# Patient Record
Sex: Female | Born: 1950
Health system: Southern US, Community
[De-identification: ages and names within clinical notes are randomized; demographics above are authoritative.]

## PROBLEM LIST (undated history)

## (undated) DIAGNOSIS — D649 Anemia, unspecified: Secondary | ICD-10-CM

## (undated) DIAGNOSIS — G4730 Sleep apnea, unspecified: Secondary | ICD-10-CM

## (undated) DIAGNOSIS — R7303 Prediabetes: Secondary | ICD-10-CM

## (undated) DIAGNOSIS — M1990 Unspecified osteoarthritis, unspecified site: Secondary | ICD-10-CM

## (undated) DIAGNOSIS — I10 Essential (primary) hypertension: Secondary | ICD-10-CM

## (undated) DIAGNOSIS — E785 Hyperlipidemia, unspecified: Secondary | ICD-10-CM

## (undated) DIAGNOSIS — R4189 Other symptoms and signs involving cognitive functions and awareness: Secondary | ICD-10-CM

## (undated) DIAGNOSIS — R413 Other amnesia: Secondary | ICD-10-CM

## (undated) HISTORY — DX: Other amnesia: R41.3

## (undated) HISTORY — DX: Hyperlipidemia, unspecified: E78.5

## (undated) HISTORY — DX: Essential (primary) hypertension: I10

## (undated) HISTORY — PX: TUBAL LIGATION: SHX77

## (undated) HISTORY — DX: Sleep apnea, unspecified: G47.30

## (undated) HISTORY — DX: Prediabetes: R73.03

## (undated) HISTORY — DX: Other symptoms and signs involving cognitive functions and awareness: R41.89

## (undated) HISTORY — PX: JOINT REPLACEMENT: SHX530

## (undated) HISTORY — DX: Anemia, unspecified: D64.9

## (undated) HISTORY — DX: Unspecified osteoarthritis, unspecified site: M19.90

## (undated) HISTORY — PX: GANGLION CYST EXCISION: SHX1691

---

## 1999-01-01 ENCOUNTER — Ambulatory Visit (HOSPITAL_COMMUNITY): Admission: RE | Admit: 1999-01-01 | Discharge: 1999-01-01 | Payer: Self-pay | Admitting: Gastroenterology

## 2000-02-03 ENCOUNTER — Encounter: Admission: RE | Admit: 2000-02-03 | Discharge: 2000-02-03 | Payer: Self-pay | Admitting: Internal Medicine

## 2000-02-03 ENCOUNTER — Encounter: Payer: Self-pay | Admitting: Internal Medicine

## 2001-01-26 ENCOUNTER — Other Ambulatory Visit: Admission: RE | Admit: 2001-01-26 | Discharge: 2001-01-26 | Payer: Self-pay | Admitting: Internal Medicine

## 2001-02-02 ENCOUNTER — Encounter: Admission: RE | Admit: 2001-02-02 | Discharge: 2001-02-18 | Payer: Self-pay | Admitting: Internal Medicine

## 2001-02-04 ENCOUNTER — Encounter: Payer: Self-pay | Admitting: Internal Medicine

## 2001-02-04 ENCOUNTER — Encounter: Admission: RE | Admit: 2001-02-04 | Discharge: 2001-02-04 | Payer: Self-pay | Admitting: Internal Medicine

## 2001-02-08 ENCOUNTER — Encounter: Admission: RE | Admit: 2001-02-08 | Discharge: 2001-02-08 | Payer: Self-pay | Admitting: Internal Medicine

## 2001-02-08 ENCOUNTER — Encounter: Payer: Self-pay | Admitting: Internal Medicine

## 2002-03-31 ENCOUNTER — Other Ambulatory Visit: Admission: RE | Admit: 2002-03-31 | Discharge: 2002-03-31 | Payer: Self-pay | Admitting: Internal Medicine

## 2002-05-06 ENCOUNTER — Encounter: Payer: Self-pay | Admitting: Internal Medicine

## 2002-05-06 ENCOUNTER — Encounter: Admission: RE | Admit: 2002-05-06 | Discharge: 2002-05-06 | Payer: Self-pay | Admitting: Internal Medicine

## 2002-11-10 ENCOUNTER — Encounter: Admission: RE | Admit: 2002-11-10 | Discharge: 2002-11-10 | Payer: Self-pay | Admitting: Internal Medicine

## 2002-11-10 ENCOUNTER — Encounter: Payer: Self-pay | Admitting: Internal Medicine

## 2003-04-03 ENCOUNTER — Other Ambulatory Visit: Admission: RE | Admit: 2003-04-03 | Discharge: 2003-04-03 | Payer: Self-pay | Admitting: Internal Medicine

## 2003-06-12 ENCOUNTER — Encounter: Admission: RE | Admit: 2003-06-12 | Discharge: 2003-06-12 | Payer: Self-pay | Admitting: Internal Medicine

## 2004-01-17 ENCOUNTER — Encounter: Admission: RE | Admit: 2004-01-17 | Discharge: 2004-04-16 | Payer: Self-pay | Admitting: Internal Medicine

## 2004-07-22 ENCOUNTER — Other Ambulatory Visit: Admission: RE | Admit: 2004-07-22 | Discharge: 2004-07-22 | Payer: Self-pay | Admitting: Internal Medicine

## 2004-08-16 ENCOUNTER — Encounter: Admission: RE | Admit: 2004-08-16 | Discharge: 2004-08-16 | Payer: Self-pay | Admitting: Internal Medicine

## 2005-08-11 ENCOUNTER — Encounter: Admission: RE | Admit: 2005-08-11 | Discharge: 2005-08-11 | Payer: Self-pay | Admitting: Internal Medicine

## 2005-09-18 ENCOUNTER — Other Ambulatory Visit: Admission: RE | Admit: 2005-09-18 | Discharge: 2005-09-18 | Payer: Self-pay | Admitting: Internal Medicine

## 2005-12-30 ENCOUNTER — Encounter: Admission: RE | Admit: 2005-12-30 | Discharge: 2005-12-30 | Payer: Self-pay | Admitting: Internal Medicine

## 2006-11-03 ENCOUNTER — Other Ambulatory Visit: Admission: RE | Admit: 2006-11-03 | Discharge: 2006-11-03 | Payer: Self-pay | Admitting: Internal Medicine

## 2006-11-30 ENCOUNTER — Ambulatory Visit: Payer: Self-pay | Admitting: Pulmonary Disease

## 2007-01-06 ENCOUNTER — Encounter: Admission: RE | Admit: 2007-01-06 | Discharge: 2007-01-06 | Payer: Self-pay | Admitting: Internal Medicine

## 2007-01-08 ENCOUNTER — Ambulatory Visit (HOSPITAL_BASED_OUTPATIENT_CLINIC_OR_DEPARTMENT_OTHER): Admission: RE | Admit: 2007-01-08 | Discharge: 2007-01-08 | Payer: Self-pay | Admitting: Pulmonary Disease

## 2007-01-19 ENCOUNTER — Ambulatory Visit: Payer: Self-pay | Admitting: Pulmonary Disease

## 2007-02-01 ENCOUNTER — Ambulatory Visit: Payer: Self-pay | Admitting: Pulmonary Disease

## 2007-11-18 ENCOUNTER — Encounter: Admission: RE | Admit: 2007-11-18 | Discharge: 2008-01-04 | Payer: Self-pay | Admitting: Orthopaedic Surgery

## 2008-01-27 ENCOUNTER — Encounter: Admission: RE | Admit: 2008-01-27 | Discharge: 2008-01-27 | Payer: Self-pay | Admitting: Internal Medicine

## 2008-05-05 ENCOUNTER — Other Ambulatory Visit: Admission: RE | Admit: 2008-05-05 | Discharge: 2008-05-05 | Payer: Self-pay | Admitting: Internal Medicine

## 2008-05-05 ENCOUNTER — Ambulatory Visit: Payer: Self-pay | Admitting: Internal Medicine

## 2008-05-23 ENCOUNTER — Inpatient Hospital Stay (HOSPITAL_COMMUNITY): Admission: RE | Admit: 2008-05-23 | Discharge: 2008-05-25 | Payer: Self-pay | Admitting: Orthopaedic Surgery

## 2008-05-23 ENCOUNTER — Encounter (INDEPENDENT_AMBULATORY_CARE_PROVIDER_SITE_OTHER): Payer: Self-pay | Admitting: Orthopaedic Surgery

## 2008-06-20 ENCOUNTER — Encounter: Admission: RE | Admit: 2008-06-20 | Discharge: 2008-08-02 | Payer: Self-pay | Admitting: Orthopaedic Surgery

## 2008-11-02 ENCOUNTER — Ambulatory Visit: Payer: Self-pay | Admitting: Internal Medicine

## 2009-01-22 ENCOUNTER — Ambulatory Visit: Payer: Self-pay | Admitting: Internal Medicine

## 2009-02-15 ENCOUNTER — Encounter: Admission: RE | Admit: 2009-02-15 | Discharge: 2009-02-15 | Payer: Self-pay | Admitting: Internal Medicine

## 2009-04-10 ENCOUNTER — Encounter (INDEPENDENT_AMBULATORY_CARE_PROVIDER_SITE_OTHER): Payer: Self-pay | Admitting: Orthopaedic Surgery

## 2009-04-10 ENCOUNTER — Inpatient Hospital Stay (HOSPITAL_COMMUNITY): Admission: RE | Admit: 2009-04-10 | Discharge: 2009-04-12 | Payer: Self-pay | Admitting: Orthopaedic Surgery

## 2009-04-21 HISTORY — PX: COLONOSCOPY: SHX174

## 2009-07-13 ENCOUNTER — Ambulatory Visit: Payer: Self-pay | Admitting: Internal Medicine

## 2009-07-13 ENCOUNTER — Other Ambulatory Visit: Admission: RE | Admit: 2009-07-13 | Discharge: 2009-07-13 | Payer: Self-pay | Admitting: Internal Medicine

## 2010-01-15 ENCOUNTER — Ambulatory Visit: Payer: Self-pay | Admitting: Internal Medicine

## 2010-03-04 ENCOUNTER — Encounter: Admission: RE | Admit: 2010-03-04 | Discharge: 2010-03-04 | Payer: Self-pay | Admitting: Internal Medicine

## 2010-04-18 ENCOUNTER — Ambulatory Visit: Admit: 2010-04-18 | Payer: Self-pay | Admitting: Internal Medicine

## 2010-04-25 ENCOUNTER — Ambulatory Visit
Admission: RE | Admit: 2010-04-25 | Discharge: 2010-04-25 | Payer: Self-pay | Source: Home / Self Care | Attending: Internal Medicine | Admitting: Internal Medicine

## 2010-07-22 LAB — CARDIAC PANEL(CRET KIN+CKTOT+MB+TROPI)
CK, MB: 2 ng/mL (ref 0.3–4.0)
CK, MB: 2.2 ng/mL (ref 0.3–4.0)
Relative Index: 1.6 (ref 0.0–2.5)
Relative Index: 1.9 (ref 0.0–2.5)
Total CK: 114 U/L (ref 7–177)
Total CK: 188 U/L — ABNORMAL HIGH (ref 7–177)
Troponin I: <0.01 ng/mL (ref 0.00–0.06)
Troponin I: <0.01 ng/mL (ref 0.00–0.06)

## 2010-07-22 LAB — URINALYSIS, MICROSCOPIC ONLY
Glucose, UA: NEGATIVE mg/dL
Hgb urine dipstick: NEGATIVE
Leukocytes, UA: NEGATIVE
Specific Gravity, Urine: 1.013 (ref 1.005–1.030)
pH: 7.5 (ref 5.0–8.0)

## 2010-07-22 LAB — TYPE AND SCREEN
ABO/RH(D): A NEG
Antibody Screen: NEGATIVE

## 2010-07-22 LAB — CBC
HCT: 29 % — ABNORMAL LOW (ref 36.0–46.0)
Hemoglobin: 12.3 g/dL (ref 12.0–15.0)
Hemoglobin: 9.9 g/dL — ABNORMAL LOW (ref 12.0–15.0)
MCHC: 34.3 g/dL (ref 30.0–36.0)
MCV: 88.8 fL (ref 78.0–100.0)
MCV: 88.9 fL (ref 78.0–100.0)
MCV: 89.8 fL (ref 78.0–100.0)
Platelets: 300 10*3/uL (ref 150–400)
Platelets: 346 10*3/uL (ref 150–400)
RBC: 3.27 MIL/uL — ABNORMAL LOW (ref 3.87–5.11)
RBC: 4.17 MIL/uL (ref 3.87–5.11)
RDW: 14.3 % (ref 11.5–15.5)
WBC: 10.8 10*3/uL — ABNORMAL HIGH (ref 4.0–10.5)
WBC: 12.3 10*3/uL — ABNORMAL HIGH (ref 4.0–10.5)
WBC: 7.9 10*3/uL (ref 4.0–10.5)

## 2010-07-22 LAB — COMPREHENSIVE METABOLIC PANEL
Albumin: 4 g/dL (ref 3.5–5.2)
BUN: 12 mg/dL (ref 6–23)
Calcium: 10.1 mg/dL (ref 8.4–10.5)
Chloride: 103 mEq/L (ref 96–112)
Creatinine, Ser: 0.76 mg/dL (ref 0.4–1.2)
GFR calc Af Amer: >60 mL/min (ref >60)
Glucose, Bld: 87 mg/dL (ref 70–99)
Potassium: 4.2 mEq/L (ref 3.5–5.1)
Total Protein: 8.6 g/dL — ABNORMAL HIGH (ref 6.0–8.3)

## 2010-07-22 LAB — APTT: aPTT: 35 seconds (ref 24–37)

## 2010-07-22 LAB — URINALYSIS, ROUTINE W REFLEX MICROSCOPIC
Bilirubin Urine: NEGATIVE
Glucose, UA: NEGATIVE mg/dL
Nitrite: NEGATIVE
Urobilinogen, UA: 0.2 mg/dL (ref 0.0–1.0)

## 2010-07-22 LAB — BASIC METABOLIC PANEL
BUN: 3 mg/dL — ABNORMAL LOW (ref 6–23)
Calcium: 8.2 mg/dL — ABNORMAL LOW (ref 8.4–10.5)
Chloride: 99 mEq/L (ref 96–112)
GFR calc Af Amer: >60 mL/min (ref >60)
GFR calc Af Amer: >60 mL/min (ref >60)
GFR calc non Af Amer: >60 mL/min (ref >60)
GFR calc non Af Amer: >60 mL/min (ref >60)
Glucose, Bld: 109 mg/dL — ABNORMAL HIGH (ref 70–99)
Glucose, Bld: 126 mg/dL — ABNORMAL HIGH (ref 70–99)
Potassium: 3.8 mEq/L (ref 3.5–5.1)
Potassium: 3.9 mEq/L (ref 3.5–5.1)
Sodium: 134 mEq/L — ABNORMAL LOW (ref 135–145)
Sodium: 136 mEq/L (ref 135–145)

## 2010-07-22 LAB — PROTIME-INR
INR: 1.15 (ref 0.00–1.49)
Prothrombin Time: 14.6 seconds (ref 11.6–15.2)

## 2010-07-22 LAB — DIFFERENTIAL
Eosinophils Absolute: 0.2 10*3/uL (ref 0.0–0.7)
Lymphs Abs: 3.7 10*3/uL (ref 0.7–4.0)
Monocytes Relative: 8 % (ref 3–12)
Neutro Abs: 6.1 10*3/uL (ref 1.7–7.7)
Neutrophils Relative %: 56 % (ref 43–77)

## 2010-08-05 LAB — TYPE AND SCREEN

## 2010-08-05 LAB — DIFFERENTIAL
Basophils Absolute: 0 10*3/uL (ref 0.0–0.1)
Basophils Relative: 0 % (ref 0–1)
Eosinophils Relative: 1 % (ref 0–5)
Lymphocytes Relative: 26 % (ref 12–46)
Monocytes Absolute: 0.6 10*3/uL (ref 0.1–1.0)
Monocytes Relative: 6 % (ref 3–12)
Neutro Abs: 6.9 10*3/uL (ref 1.7–7.7)

## 2010-08-05 LAB — ABO/RH: ABO/RH(D): A NEG

## 2010-08-05 LAB — CBC
HCT: 37 % (ref 36.0–46.0)
Platelets: 423 10*3/uL — ABNORMAL HIGH (ref 150–400)
RDW: 16.9 % — ABNORMAL HIGH (ref 11.5–15.5)
WBC: 10.3 10*3/uL (ref 4.0–10.5)

## 2010-08-05 LAB — COMPREHENSIVE METABOLIC PANEL
AST: 23 U/L (ref 0–37)
Albumin: 3.7 g/dL (ref 3.5–5.2)
Alkaline Phosphatase: 126 U/L — ABNORMAL HIGH (ref 39–117)
BUN: 9 mg/dL (ref 6–23)
Chloride: 102 mEq/L (ref 96–112)
GFR calc Af Amer: >60 mL/min (ref >60)
Potassium: 3.8 mEq/L (ref 3.5–5.1)
Total Bilirubin: 0.7 mg/dL (ref 0.3–1.2)
Total Protein: 8.4 g/dL — ABNORMAL HIGH (ref 6.0–8.3)

## 2010-08-05 LAB — APTT: aPTT: 32 seconds (ref 24–37)

## 2010-08-05 LAB — URINE CULTURE
Colony Count: NO GROWTH
Culture: NO GROWTH

## 2010-08-05 LAB — URINALYSIS, ROUTINE W REFLEX MICROSCOPIC
Glucose, UA: NEGATIVE mg/dL
Nitrite: NEGATIVE
Specific Gravity, Urine: 1.027 (ref 1.005–1.030)
pH: 5.5 (ref 5.0–8.0)

## 2010-08-06 LAB — BASIC METABOLIC PANEL
BUN: 10 mg/dL (ref 6–23)
Calcium: 8.6 mg/dL (ref 8.4–10.5)
Calcium: 8.7 mg/dL (ref 8.4–10.5)
Chloride: 100 mEq/L (ref 96–112)
Creatinine, Ser: 0.52 mg/dL (ref 0.4–1.2)
Creatinine, Ser: 0.6 mg/dL (ref 0.4–1.2)
GFR calc Af Amer: >60 mL/min (ref >60)
GFR calc Af Amer: >60 mL/min (ref >60)
GFR calc non Af Amer: >60 mL/min (ref >60)
Sodium: 134 mEq/L — ABNORMAL LOW (ref 135–145)

## 2010-08-06 LAB — CBC
MCHC: 34.1 g/dL (ref 30.0–36.0)
MCV: 83.5 fL (ref 78.0–100.0)
MCV: 83.7 fL (ref 78.0–100.0)
Platelets: 341 10*3/uL (ref 150–400)
Platelets: 364 10*3/uL (ref 150–400)
WBC: 11 10*3/uL — ABNORMAL HIGH (ref 4.0–10.5)
WBC: 13.4 10*3/uL — ABNORMAL HIGH (ref 4.0–10.5)

## 2010-08-06 LAB — CARDIAC PANEL(CRET KIN+CKTOT+MB+TROPI)
Relative Index: 1.5 (ref 0.0–2.5)
Total CK: 148 U/L (ref 7–177)
Troponin I: <0.01 ng/mL (ref 0.00–0.06)
Troponin I: <0.01 ng/mL (ref 0.00–0.06)
Troponin I: <0.01 ng/mL (ref 0.00–0.06)

## 2010-08-06 LAB — PROTIME-INR
INR: 1.1 (ref 0.00–1.49)
Prothrombin Time: 14.7 seconds (ref 11.6–15.2)
Prothrombin Time: 17.7 seconds — ABNORMAL HIGH (ref 11.6–15.2)

## 2010-09-03 NOTE — Consult Note (Signed)
NAME:  [name], [name]                ACCOUNT NO.:  [id number]   MEDICAL RECORD NO.:  [id number]          PATIENT TYPE:  INP   LOCATION:  5023                         FACILITY:  [facility]   PHYSICIAN:  Charlestine Massed, MDDATE OF BIRTH:  04-01-1951   DATE OF CONSULTATION:  05/24/2008  DATE OF DISCHARGE:                                 CONSULTATION   REFERRING PHYSICIAN:  Claude Manges. Cleophas Dunker, M.D.   PRIMARY CARE PHYSICIAN:  Dr. Luanna Cole. Bax[place].   ORTHOPEDIC SURGEON:  Dr. Norlene Campbell.   I was called by orthopedic surgeon, Dr. Cleophas Dunker for medical consult on  this patient.   REASON FOR CONSULTATION:  Shortness of breath in a.m. postoperatively.   HISTORY OF PRESENT ILLNESS:  [name] is a 60 year old female  with a past history of hypertension, dyslipidemia and mild obstructive  sleep apnea, advised weight loss by pulmonology, who had a right total  knee arthroplasty for osteoarthritis yesterday, May 23, 2008, and  is on the postop floor at [facility] convalescing.  On Tuesday  morning, she was in her bed.  She received a phone call earlier in the  morning that one of her close family members passed away.  After that,  she was staying in bed at around 7:30 to 8 o'clock.  She suddenly had  mild shortness of breath which she stated was something new for her.  It  stayed on for a total of 20-30 minutes.  She was seen and a chest x-ray  and EKG were done.  After that, the symptoms resolved by itself.  She  states that the shortness of breath was something she felt like that she  had to breathe more.  She denies having a feeling that she was choking.  She denies having a feeling that she was passing out without any air,  but the only symptom was that she felt that she had to breathe more.  She denies any chest pain.  No palpitations, no heaviness in the chest,  no back pain, no fever, no cough, no chills.  No nausea, vomiting or  diarrhea.  After some time, she  tried to sit up.  At that moment, she  felt slightly dizzy and she laid down right away and the dizziness was  relieved by lying down.  She denied any palpitations at the moment when  she was feeling dizzy.   Prior to the surgery, she says that she was able to walk and go up the  stairs quite comfortably without any limitations, though in the past few  weeks her mobility has come down because of her knee.  She never had any  limitations of chest pain on exertion or shortness of breath on exertion  at any time.  She states that one or twice she had some minimal  palpitations which were self-limiting, but at no time she felt dizzy.  She had no prior episodes of syncope.   She has a murmur from a childhood which is well known to her primary  care physician, Dr. Luanna Cole. [place].  Dr. Lenord Fellers actually had  done an  echocardiogram just before surgery for clearance, and the patient states  that her doctor has not mentioned anything significantly wrong about the  echocardiogram.  She states that she has a murmur from childhood and  that it has been checked by her primary care doctor before.  Currently,  the patient is comfortable and she is staying in bed.  She did not have  any recurrence of the episodes so far.   PAST MEDICAL HISTORY:  1. Hypertension.  2. Dyslipidemia.  3. Obesity with mild obstructive sleep apnea.   CURRENT MEDICATIONS:  1. Maxzide 37.5/25 one tablet daily.  2. Lisinopril 10 mg daily.  3. Lovenox 30 mg q.12 h., for DVT prophylaxis postop.  4. Dilaudid for pain.  5. Coumadin protocol.  6. Colace.   SOCIAL HISTORY:  She denies smoking.  No regular alcohol use.  No drug  use.  She is married and she has children.  She works at home.   FAMILY HISTORY:  One sister has colon cancer.  Her mom has diabetes.   ALLERGIES:  NO KNOWN DRUG ALLERGIES.   REVIEW OF SYSTEMS:  A 12-point review of systems were done.  Positive  pertinent features are mentioned in the history of  present illness and  negative otherwise.   PHYSICAL EXAMINATION:  VITAL SIGNS:  Blood pressure 144/70, heart rate  82 per minute with occasional irregular beats being heard, respiratory  rate 16 per minute, temperature 98.8 max today.  O2 sat is 99% on room  air.  GENERAL:  The patient is alert, awake, well oriented, not in any  distress, not tachypneic.  Answers all questions very well.  HEAD/NECK:  Pupils reactive to light.  No JVD.  Bilateral mild  enlargement of submandibular lymph nodes.  Nontender.  No bleeding seen  in oral or nasal mucosa.  Neck is supple, no JVD and no bruit.  CHEST:  Bilateral air entry, good anteriorly and posteriorly.  No rales  or crackles heard.  No wheezing heard.  CARDIAC:  S1-S2 heard.  Systolic murmur heard in the Pulmonary and  Roger's area.  Occasional ectopic beats are heard during examination.  ABDOMEN:  Soft, nontender.  No organomegaly, no rigidity, no  costovertebral angle tenderness.  Gallstones positive.  EXTREMITIES:  Right lower extremity status post TKA right.  Left lower  extremity, no tenderness, no edema.  CNS:  Able to both upper extremities and left lower extremity well.  Right lower extremity has been immobilized from her surgery.  Speech and  comprehension are intact.  PSYCH:  Has a stable mental status now.   LABORATORY DATA:  EKG shows normal sinus rhythm at 72 beats per minute.  Unifocal PACs are present.  QTC is 440 milliseconds.  No acute ST-T wave  changes or ischemia.  Chest x-ray:  No cardiomegaly.  No lung disease  reported.  No acute changes seen.  CBC - WBC 13.4, hemoglobin 9.1,  hematocrit 28.3, platelets 264.  The patient received 1 unit of PRBCs  after this lab.  INR is 1.1.  BMP; sodium 133, potassium 3.7, chloride  100, bicarb 27, BUN 10, creatinine 0.6, glucose 112 and calcium 8.6.   The patient had an echocardiogram done preoperatively at Dr. Beryle Quant  office.   ASSESSMENT/PLAN:  A 60 year old female with a  history of hypertension  and mild sleep apnea and dyslipidemia with one episode of shortness of  breath with anxiety as per patient.  Her troponin levels have been  negative  so far and CK-MB has been negative so far.  EKG reveals PACs.  Other than that, there are no ischemic changes.  Blood pressure is  stable.  The patient has received PRBCs after a hemoglobin of 9.1.  I  suggest placing the patient on telemetry for at least 24-hours.   The patient has multiple unifocal PACs present.  Even though the patient  did not have episodes of palpitation, to rule out any rhythm issues  contributing to the symptoms.  Currently, the patient does not have any  issues with ischemia so far.  We will place the patient on telemetry to  rule out any abnormal arrhythmias which have contributed to the  symptoms.  The fact that the patient had bad news in the family could  have contributed to the symptoms too, but will consider that only after  we have ruled out any other organic issues that could have contributed.   SUGGESTIONS:  1. Place the patient on telemetry.  2. Watch hemoglobin and keep hemoglobin more than 10.   Will follow the patient with regards to this daily.  [facility]      Charlestine Massed, MD  Electronically Signed     UT/MEDQ  D:  05/24/2008  T:  05/24/2008  Job:  [id number]

## 2010-09-03 NOTE — Assessment & Plan Note (Signed)
[facility]                             PULMONARY OFFICE NOTE   NAME:[name]                       MRN:          [id number]  DATE:02/01/2007                            DOB:          1950-07-19    SUBJECTIVE:  [name] comes in today for followup after her recent  sleep study.  She was found to have mild obstructive sleep apnea with an  apnea/hypopnea index of 17 events per hour and O2 desaturation as low as  87% only transiently.  The events were clearly worse in a supine  position.  I had a long discussion with [name] about her sleep study  and have answered all questions.   PHYSICAL EXAM:  GENERAL:  She is an overweight black female in no acute  distress.  Blood pressure is 144/86, pulse 67, temperature 98.2, weight 192 pounds,  O2 saturation on room air is 97%.   IMPRESSION:  Mild obstructive sleep apnea with some impact on her  quality of life.  I have had a long discussion with her about this  degree of sleep apnea and have explained it poses very little health  risk to her.  Therefore, her treatment decision should really be based  on her lifestyle and how much this is impacting her quality of life.  I  have discussed with her the possibility of taking 6 months and working  aggressively on weight loss, upper airway surgery, oral appliance, and  finally CPAP.  The patient at this point in time would like to take 6  months and work on weight loss.  I have told her I have no issue with  this as long as she is willing to follow up with me or to let me know if  things are getting worse.   PLAN:  The patient will take the next 6 months and try to work  aggressively on weight loss and will return to see me at that time if  she is not making good progress.  She is to call me if she changes her  mind in the interim and would like to try one of the more aggressive  approaches.  My preference would either be oral appliance or CPAP for  treatment while she is trying to lose weight.     Barbaraann Share, MD,FCCP  Electronically Signed    [facility]/MedQ  DD: 02/01/2007  DT: 02/02/2007  Job #: [id number]   cc:   Luanna Cole. Lenord Fellers, M.D.

## 2010-09-03 NOTE — Op Note (Signed)
NAME:  [name], [name]                ACCOUNT NO.:  [id number]   MEDICAL RECORD NO.:  [id number]          PATIENT TYPE:  INP   LOCATION:  5023                         FACILITY:  [facility]   PHYSICIAN:  Claude Manges. Whitfield, M.D.DATE OF BIRTH:  1951/03/13   DATE OF PROCEDURE:  05/23/2008  DATE OF DISCHARGE:                               OPERATIVE REPORT   PREOPERATIVE DIAGNOSIS:  Osteoarthritis, right knee.   POSTOPERATIVE DIAGNOSIS:  Osteoarthritis, right knee.   PROCEDURE:  Right total knee replacement.   SURGEON:  Claude Manges. Cleophas Dunker, MD   ASSISTANT:  Oris Drone. Petrarca, PA-C   ANESTHESIA:  General with supplemental femoral nerve block.   COMPLICATIONS:  None.   COMPONENTS:  DePuy LCS medium femoral component, a 2.5 keeled tibial  tray with a 10-mm bridging bearing a metal backed 3 peg patella, all was  secured with polymethyl methacrylate.   Mr. Santiago Bumpers was present for the entire case and was instrumental in the  successful outcome given his expertise.   PROCEDURE:  With the patient comfortable on the operating table and  under general orotracheal anesthesia, nursing staff inserted a Foley  catheter.  Urine was clear.   Tourniquet was applied to the right lower extremity, which had been  appropriately marked as the operative extremity preoperatively.  The  right lower extremity was then prepped with Betadine scrub and then  DuraPrep from the tourniquet to the mid foot.  Sterile draping was  performed.   The extremity was elevated and then Esmarch exsanguinated with the  proximal tourniquet at 350 mmHg.   A midline longitudinal incision was made centered about the patella  extending from the superior pouch and tibial tubercle via sharp  dissection, incision was carried down to the subcutaneous tissue.  The  first layer of capsule was incised in the midline.  Medial parapatellar  incision was then made with the Bovie.  The joint was entered.  There  was abundant effusion and a  very large and diffuse amount of beefy red  synovitis.  A total synovectomy was performed.  Specimens were sent to  the lab for cytology.   At that point, we measured our components.  We had templated a medium  femoral component preoperatively, and 2.5 or 3 tibial tray.  We  confirmed the medium component initially and then confirmed the 2.5-mm  tibial tray subsequently.   The first osteotomy was made along the proximal tibia with 7 degrees of  posterior declination.  Using the external guide, subsequent cuts were  then made on the femur allowing distal femoral cut of 4 degrees valgus.  MCL and LCL were remained intact.  Lamina spreader was inserted at both  compartments to resect medial and lateral menisci, ACL and PCL.  There  was also abundant synovitis in the posterior compartment and osteophytes  along the posterior femoral condyles medially and laterally, which were  resected with an osteotome, and a synovectomy was performed.  Flexion/extension gaps were symmetrical at 10 mm.  The finishing guide  was then applied to obtain the taper cuts on the femur.   Retractor  was then placed about the tibia, tibia was delivered  anteriorly.  We measured a size 2.5 tray.  This was applied.  The center  hole was then made followed by the keeled cut.  With the trial tibial  tray in place, a 10-mm bridging bearing was applied followed by the  medium femoral component through a full range of motion.  There was no  malrotation of the tibia component or polyethylene bearing.  There was  no opening with varus or valgus stress and we had full extension and  flexion well beyond 120 degrees.   The patella was then prepared by removing 10 mm of bone leaving  approximately 12-mm patellar thickness.  The patellar jig was applied, 3  holes made, and the trial patella was put in place.  Through full range  of motion, the patella did not sublux.   The trial components were then removed.  We copiously  irrigated the  joint with saline solution and then inserted the final components.  A  #2.5 tibial tray was applied with polymethyl methacrylate.  Extraneous  methacrylate was removed from its periphery.  The 10-mm bridging bearing  was inserted after clearing any methacrylate from around the periphery  of the components within the center hole.  This was followed by the  cemented femoral component.  Again, extraneous methacrylate was removed,  polyethylene bearing was then placed in the interim.   The patella was applied with methacrylate and patellar clamp.  While  waiting for the methacrylate to mature, the capsule was then injected  with Marcaine with epinephrine and we injected the FloSeal around the  components around the synovectomy area and posteriorly.  After complete  maturation of methacrylate, the joint was inspected and any further  hardened methacrylate was removed with an osteotome.  The joint was then  explored without any further methacrylate or loose material and was then  lightly irrigated.  Tourniquet was deflated.  Any gross bleeding was  easily controlled with the Bovie.  We had a nice clean joint.   The deep capsule was closed with #1 interrupted Ethibond.  Superficial  capsule was closed with running 0 Vicryl, subcu with 2-0 Vicryl, skin  was closed with skin clips.  Sterile bulky dressing was applied followed  by the patient's support stocking.   The patient tolerated the procedure without complications.      Claude Manges. Cleophas Dunker, M.D.  Electronically Signed     PWW/MEDQ  D:  05/23/2008  T:  05/24/2008  Job:  [id number]

## 2010-09-03 NOTE — Procedures (Signed)
NAME:  [name], [name]                ACCOUNT NO.:  [id number]   MEDICAL RECORD NO.:  [id number]          PATIENT TYPE:  OUT   LOCATION:  SLEEP CENTER                 [facility]ACILITY[facility]   PHYSICIAN:  Barbaraann Share, MD,FCCPDATE OF BIRTH:  02-Sep-1950   DATE OF STUDY:  01/08/2007                            NOCTURNAL POLYSOMNOGRAM   REFERRING PHYSICIAN:  Barbaraann Share, MD,FCCP   INDICATION FOR STUDY:  Hypersomnia with sleep apnea.   EPWORTH SLEEPINESS SCORE:  17   MEDICATIONS:   SLEEP ARCHITECTURE:  The patient had a total sleep time of 348 minutes  with no slow wave sleep and decreased REM.  Sleep onset latency was  normal, and REM onset was very prolonged.  Sleep efficiency was mildly  decreased at 88%.   RESPIRATORY DATA:  The patient was found to have 82 obstructive  hypopneas and 19 obstructive apneas, for an apnea/hypopnea index of 17  events per hour.  The events were worse in the supine position and REM,  and loud snoring was noted throughout.   OXYGEN DATA:  Patient had O2 desaturation as low as 87% with the  patient's obstructive events.   CARDIAC DATA:  Occasional PAC but no clinically significant arrhythmia  was noted.   MOVEMENT-PARASOMNIA:  None   IMPRESSIONS-RECOMMENDATIONS:  1. Mild-to-moderate obstructive sleep apnea/hypopnea syndrome with an      apnea/hypopnea index of 17 events per hour and O2 desaturation as      low as 87%.  Treatment for this degree of sleep apnea can include      weight loss alone if applicable, upper airway surgery, oral      appliance, and also CPAP.  Clinical correlation is suggested.  2. Occasional PAC noted but no clinically significant arrhythmias.     Barbaraann Share, MD,FCCP  Diplomate, American Board of Sleep  Medicine  Electronically Signed    [facility]/MEDQ  D:  01/21/2007 07:36:08  T:  01/21/2007 13:12:26  Job:  [id number]

## 2010-09-03 NOTE — Assessment & Plan Note (Signed)
[facility]                             PULMONARY OFFICE NOTE   NAME:[name], [name]                       MRN:          [id number]  DATE:11/30/2006                            DOB:          04-18-1951    HISTORY OF PRESENT ILLNESS:  The patient is a very pleasant 60 year old  black female whom I have been asked to see for possible obstructive  sleep apnea.  The patient states that her husband has been complaining  about loud snoring and that he has also seen pauses in her breathing, as  well as an abnormal breathing pattern during sleep.  The patient  typically goes to bed about 10 p.m. and gets up at 4:30 a.m. to start  her day.  She does not feel completely rested in the mornings whenever  she arises, although it is unclear how much of this is related to the  early hour of her awakening.  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  She has dozes during webinar-type meetings, and  does feel that there are some concentration issues during the day.  She  does take a nap almost daily and states that she can doze fairly easily  with TV and movies in the evening.  She does have some sleep pressure  with driving.  Overall, her weight has been stable over the last few  years.   PAST MEDICAL HISTORY:  1. Significant for hypertension.  2. Dyslipidemia.   Otherwise, unremarkable.   CURRENT MEDICATIONS:  1. Lisinopril 10 mg daily.  2. Simvastatin 40 mg daily.  3. Sertraline 100 mg daily.  4. Maxzide 37.5/25 1 daily.  5. Prilosec 20 mg daily.   The patient has no known drug allergies.   SOCIAL HISTORY:  She is married and has children.  She has never smoked.   FAMILY HISTORY:  Remarkable for a sister having had colon cancer.   REVIEW OF SYSTEMS:  As per history of present illness.  Also see patient  intake form documented on the chart.   PHYSICAL EXAM:   GENERAL:  She is an obese black female in no acute  distress.  Blood pressure 112/70, pulse 59, temperature 98.3, weight 186 pounds.  She is 5 feet 2 inches tall.  O2 saturation on room air is 97%.  HEENT:  Pupils are equal, round, and reactive to light and  accommodation.  Extraocular muscles are intact.  Nares show turbinate  hypertrophy with some narrowing.  Otherwise, clear.  Oropharynx does  show elongation of the soft palate, but a normal uvula.  NECK:  Supple without JVD or lymphadenopathy.  There is no palpable  thyromegaly.  CHEST:  Totally clear to auscultation.  CARDIAC:  Regular rate and rhythm with a very subtle 1/6 systolic  murmur.  ABDOMEN:  Soft and nontender with good bowel sounds.  GENITAL, RECTAL, AND BREASTS:  Not done and not indicated.  LOWER EXTREMITIES:  Without edema.  Pulses are intact distally.  NEUROLOGIC:  Alert  and oriented with no obvious motor deficits.   IMPRESSION:  Probable obstructive sleep apnea.  The patient does give a  history of non-restorative sleep and has some alertness and  concentration issues during the day with periods of inactivity.  Certainly with her weight, abnormal upper airway anatomy, and the  history that her husband gives, there is concern about this.  I have had  a long discussion with her about sleep apnea, including the short-term  quality of life issues and the long-term cardiovascular issues.  She is  agreeable to proceeding with nocturnal polysomnography.   PLAN:  1. Schedule for nocturnal polysomnogram.  2. Work on weight loss.  3. The patient will follow up after the above.     Barbaraann Share, MD,FCCP  Electronically Signed    [facility]/MedQ  DD: 11/30/2006  DT: 12/01/2006  Job #: [id number]   cc:   Luanna Cole. Lenord Fellers, M.D.

## 2010-09-09 ENCOUNTER — Other Ambulatory Visit: Payer: Self-pay | Admitting: *Deleted

## 2010-09-09 MED ORDER — LISINOPRIL 10 MG PO TABS: 10.0000 mg | ORAL_TABLET | Freq: Every day | ORAL | Status: DC

## 2010-09-09 MED ORDER — SERTRALINE HCL 100 MG PO TABS: 100.0000 mg | ORAL_TABLET | Freq: Every day | ORAL | Status: DC

## 2010-09-09 MED ORDER — ROSUVASTATIN CALCIUM 10 MG PO TABS: 10.0000 mg | ORAL_TABLET | Freq: Every day | ORAL | Status: DC

## 2010-09-09 MED ORDER — TRIAMTERENE-HCTZ 37.5-25 MG PO CAPS: 1.0000 | ORAL_CAPSULE | Freq: Every day | ORAL | Status: DC

## 2010-11-28 ENCOUNTER — Other Ambulatory Visit: Payer: Self-pay | Admitting: Internal Medicine

## 2010-11-28 ENCOUNTER — Other Ambulatory Visit: Payer: 59 | Admitting: Internal Medicine

## 2010-11-28 DIAGNOSIS — I10 Essential (primary) hypertension: Secondary | ICD-10-CM

## 2010-11-28 DIAGNOSIS — E785 Hyperlipidemia, unspecified: Secondary | ICD-10-CM

## 2010-11-28 DIAGNOSIS — Z0000 Encounter for general adult medical examination without abnormal findings: Secondary | ICD-10-CM

## 2010-11-28 LAB — CBC WITH DIFFERENTIAL/PLATELET
Eosinophils Absolute: 0.2 10*3/uL (ref 0.0–0.7)
Eosinophils Relative: 2 % (ref 0–5)
Lymphs Abs: 2.8 10*3/uL (ref 0.7–4.0)
MCH: 30.1 pg (ref 26.0–34.0)
MCV: 94.7 fL (ref 78.0–100.0)
Monocytes Relative: 7 % (ref 3–12)
Platelets: 296 10*3/uL (ref 150–400)
RBC: 3.99 MIL/uL (ref 3.87–5.11)

## 2010-11-28 LAB — HEPATIC FUNCTION PANEL
AST: 19 U/L (ref 0–37)
Alkaline Phosphatase: 112 U/L (ref 39–117)
Bilirubin, Direct: 0.1 mg/dL (ref 0.0–0.3)
Indirect Bilirubin: 0.7 mg/dL (ref 0.0–0.9)
Total Bilirubin: 0.8 mg/dL (ref 0.3–1.2)

## 2010-11-28 LAB — BASIC METABOLIC PANEL
CO2: 25 mEq/L (ref 19–32)
Calcium: 9.5 mg/dL (ref 8.4–10.5)
Creat: 0.72 mg/dL (ref 0.50–1.10)
Glucose, Bld: 80 mg/dL (ref 70–99)

## 2010-11-29 ENCOUNTER — Encounter: Payer: Self-pay | Admitting: Internal Medicine

## 2010-11-29 ENCOUNTER — Ambulatory Visit (INDEPENDENT_AMBULATORY_CARE_PROVIDER_SITE_OTHER): Payer: 59 | Admitting: Internal Medicine

## 2010-11-29 VITALS — BP 148/80 | HR 72 | Temp 97.7°F | Ht 62.0 in | Wt 198.0 lb

## 2010-11-29 DIAGNOSIS — I10 Essential (primary) hypertension: Secondary | ICD-10-CM

## 2010-11-29 DIAGNOSIS — Z862 Personal history of diseases of the blood and blood-forming organs and certain disorders involving the immune mechanism: Secondary | ICD-10-CM

## 2010-11-29 DIAGNOSIS — R7303 Prediabetes: Secondary | ICD-10-CM

## 2010-11-29 DIAGNOSIS — R7309 Other abnormal glucose: Secondary | ICD-10-CM

## 2010-11-29 DIAGNOSIS — E785 Hyperlipidemia, unspecified: Secondary | ICD-10-CM

## 2010-11-29 DIAGNOSIS — G4730 Sleep apnea, unspecified: Secondary | ICD-10-CM

## 2010-11-29 DIAGNOSIS — M1990 Unspecified osteoarthritis, unspecified site: Secondary | ICD-10-CM

## 2010-11-29 DIAGNOSIS — Z0000 Encounter for general adult medical examination without abnormal findings: Secondary | ICD-10-CM

## 2010-11-29 LAB — POCT URINALYSIS DIPSTICK
Blood, UA: NEGATIVE
Protein, UA: NEGATIVE
Spec Grav, UA: 1.025
Urobilinogen, UA: NEGATIVE

## 2010-11-29 LAB — HEMOGLOBIN A1C: Mean Plasma Glucose: 120 mg/dL — ABNORMAL HIGH (ref <117)

## 2010-12-02 ENCOUNTER — Encounter: Payer: Self-pay | Admitting: Internal Medicine

## 2011-01-03 ENCOUNTER — Other Ambulatory Visit: Payer: Self-pay

## 2011-01-03 MED ORDER — ROSUVASTATIN CALCIUM 10 MG PO TABS: 10.0000 mg | ORAL_TABLET | Freq: Every day | ORAL | Status: DC

## 2011-01-20 ENCOUNTER — Other Ambulatory Visit: Payer: Self-pay | Admitting: *Deleted

## 2011-01-20 ENCOUNTER — Encounter: Payer: Self-pay | Admitting: Internal Medicine

## 2011-01-20 DIAGNOSIS — G4730 Sleep apnea, unspecified: Secondary | ICD-10-CM | POA: Insufficient documentation

## 2011-01-20 DIAGNOSIS — Z862 Personal history of diseases of the blood and blood-forming organs and certain disorders involving the immune mechanism: Secondary | ICD-10-CM | POA: Insufficient documentation

## 2011-01-20 DIAGNOSIS — E785 Hyperlipidemia, unspecified: Secondary | ICD-10-CM | POA: Insufficient documentation

## 2011-01-20 DIAGNOSIS — M1990 Unspecified osteoarthritis, unspecified site: Secondary | ICD-10-CM | POA: Insufficient documentation

## 2011-01-20 DIAGNOSIS — I10 Essential (primary) hypertension: Secondary | ICD-10-CM | POA: Insufficient documentation

## 2011-01-20 MED ORDER — ROSUVASTATIN CALCIUM 10 MG PO TABS: 10.0000 mg | ORAL_TABLET | Freq: Every day | ORAL | Status: DC

## 2011-01-20 NOTE — Progress Notes (Signed)
  Subjective:    Patient ID: [name], female    DOB: 10-Jul-1950, 60 y.o.   MRN: [id number]  HPI 60 year old black female with history of hypertension, hyperlipidemia, prediabetes, sleep apnea, iron deficiency anemia or evaluation of medical problems. Had mammogram November 2011. Colonoscopy July 2011. Bone density study July 2004. Tetanus immunization 2002/09/22. Surgery for left ganglion cyst 1989. Bilateral tubal ligation 1983. Right knee replacement February 2010. History of anxiety depression. Colonoscopy showed diverticulosis.  Patient is a Occupational psychologist for [employer]. Husband is an Art gallery manager with [employer]. 2 adult daughters and one son. Does not smoke. Very occasional alcohol consumption.  Father with history of hypertension died of renal failure and congestive heart failure. History of subdural hematoma. He was 16 years old.  Mother died at age 71 of diabetes and hypertension. One brother with history of hypertension on dialysis. 2 sisters: one alive with history of colon cancer. Younger sister died in 1994/09/22 of a cerebral hemorrhage    Review of Systems  Constitutional:       Obesity and lack of exercise  HENT: Negative.   Eyes: Negative.   Respiratory: Negative.   Cardiovascular: Negative.   Gastrointestinal: Negative.   Genitourinary: Negative.   Musculoskeletal:       Musculoskeletal pain  Neurological: Negative.   Hematological: Negative.   Psychiatric/Behavioral:       History of anxiety and depression       Objective:   Physical Exam  Vitals reviewed. Constitutional: She is oriented to person, place, and time. She appears well-nourished. No distress.  HENT:  Head: Normocephalic and atraumatic.  Right Ear: External ear normal.  Left Ear: External ear normal.  Mouth/Throat: Oropharynx is clear and moist. No oropharyngeal exudate.  Eyes: EOM are normal. Pupils are equal, round, and reactive to light. No scleral icterus.  Neck: Neck supple. No JVD  present. No thyromegaly present.  Cardiovascular: Normal rate, regular rhythm and normal heart sounds.   No murmur heard. Pulmonary/Chest: Effort normal and breath sounds normal. She has no wheezes. She has no rales.       Breasts normal female  Abdominal: Soft. Bowel sounds are normal. She exhibits no mass. There is no tenderness.  Genitourinary:       Bimanual exam normal  Musculoskeletal: She exhibits no edema.  Lymphadenopathy:    She has no cervical adenopathy.  Neurological: She is alert and oriented to person, place, and time. She has normal reflexes. No cranial nerve deficit. Coordination normal.  Skin: Skin is warm and dry.  Psychiatric: She has a normal mood and affect.          Assessment & Plan:  Hyperlipidemia  Hypertension  Prediabetes  Obesity  Anxiety depression  Sleep apnea  Osteoarthritis  History of iron deficiency anemia  Plan: Encouraged diet exercise and weight loss. Return in 6 months for fasting lipid panel liver functions hemoglobin A1c and office visit along with blood pressure check

## 2011-01-23 ENCOUNTER — Other Ambulatory Visit: Payer: Self-pay | Admitting: Internal Medicine

## 2011-04-10 ENCOUNTER — Other Ambulatory Visit: Payer: Self-pay | Admitting: Internal Medicine

## 2011-04-10 DIAGNOSIS — Z1231 Encounter for screening mammogram for malignant neoplasm of breast: Secondary | ICD-10-CM

## 2011-04-18 ENCOUNTER — Ambulatory Visit
Admission: RE | Admit: 2011-04-18 | Discharge: 2011-04-18 | Disposition: A | Discharge status: Home / Self Care | Payer: 59 | Source: Ambulatory Visit | Attending: Internal Medicine | Admitting: Internal Medicine

## 2011-04-18 DIAGNOSIS — Z1231 Encounter for screening mammogram for malignant neoplasm of breast: Secondary | ICD-10-CM

## 2011-06-02 ENCOUNTER — Other Ambulatory Visit: Payer: 59 | Admitting: Internal Medicine

## 2011-06-03 ENCOUNTER — Ambulatory Visit: Payer: 59 | Admitting: Internal Medicine

## 2011-06-06 ENCOUNTER — Other Ambulatory Visit: Payer: 59 | Admitting: Internal Medicine

## 2011-06-06 DIAGNOSIS — Z79899 Other long term (current) drug therapy: Secondary | ICD-10-CM

## 2011-06-06 DIAGNOSIS — E785 Hyperlipidemia, unspecified: Secondary | ICD-10-CM

## 2011-06-06 LAB — HEPATIC FUNCTION PANEL
Bilirubin, Direct: 0.1 mg/dL (ref 0.0–0.3)
Indirect Bilirubin: 0.6 mg/dL (ref 0.0–0.9)
Total Protein: 7.5 g/dL (ref 6.0–8.3)

## 2011-06-06 LAB — LIPID PANEL
LDL Cholesterol: 117 mg/dL — ABNORMAL HIGH (ref 0–99)
Total CHOL/HDL Ratio: 3.4 Ratio
VLDL: 15 mg/dL (ref 0–40)

## 2011-06-06 LAB — HEMOGLOBIN A1C
Hgb A1c MFr Bld: 5.4 % (ref <5.7)
Mean Plasma Glucose: 108 mg/dL (ref <117)

## 2011-06-13 ENCOUNTER — Ambulatory Visit (INDEPENDENT_AMBULATORY_CARE_PROVIDER_SITE_OTHER): Payer: 59 | Admitting: Internal Medicine

## 2011-06-13 ENCOUNTER — Encounter: Payer: Self-pay | Admitting: Internal Medicine

## 2011-06-13 VITALS — BP 124/64 | HR 76 | Temp 98.4°F | Wt 199.5 lb

## 2011-06-13 DIAGNOSIS — Z862 Personal history of diseases of the blood and blood-forming organs and certain disorders involving the immune mechanism: Secondary | ICD-10-CM

## 2011-06-13 DIAGNOSIS — R010 Benign and innocent cardiac murmurs: Secondary | ICD-10-CM

## 2011-06-13 DIAGNOSIS — R011 Cardiac murmur, unspecified: Secondary | ICD-10-CM

## 2011-06-13 DIAGNOSIS — R7309 Other abnormal glucose: Secondary | ICD-10-CM

## 2011-06-13 DIAGNOSIS — I10 Essential (primary) hypertension: Secondary | ICD-10-CM

## 2011-06-13 DIAGNOSIS — R7303 Prediabetes: Secondary | ICD-10-CM

## 2011-06-13 DIAGNOSIS — Z96659 Presence of unspecified artificial knee joint: Secondary | ICD-10-CM

## 2011-06-13 DIAGNOSIS — F419 Anxiety disorder, unspecified: Secondary | ICD-10-CM

## 2011-06-13 DIAGNOSIS — Z96651 Presence of right artificial knee joint: Secondary | ICD-10-CM

## 2011-06-13 DIAGNOSIS — E785 Hyperlipidemia, unspecified: Secondary | ICD-10-CM

## 2011-06-13 DIAGNOSIS — G4730 Sleep apnea, unspecified: Secondary | ICD-10-CM

## 2011-06-13 DIAGNOSIS — F411 Generalized anxiety disorder: Secondary | ICD-10-CM

## 2011-06-13 DIAGNOSIS — M1990 Unspecified osteoarthritis, unspecified site: Secondary | ICD-10-CM

## 2011-06-22 ENCOUNTER — Encounter: Payer: Self-pay | Admitting: Internal Medicine

## 2011-07-20 DIAGNOSIS — R010 Benign and innocent cardiac murmurs: Secondary | ICD-10-CM | POA: Insufficient documentation

## 2011-07-20 DIAGNOSIS — F419 Anxiety disorder, unspecified: Secondary | ICD-10-CM | POA: Insufficient documentation

## 2011-07-20 NOTE — Progress Notes (Signed)
  Subjective:    Patient ID: Laneta Simmers, female    DOB: Sep 09, 1950, 61 y.o.   MRN: [id number]  HPI 61 year old black female in today for six-month recheck. History of hypertension hyperlipidemia osteoarthritis prediabetes and sleep apnea. Zocor has caused myalgias so we switched to Crestor. Is able to tolerate that. History of iron deficiency anemia related to menorrhagia. No known drug allergies. History of surgery for left hand ganglion cyst 1989. Tubal ligation 1983. Right knee replacement February 2010. Not really able to exercise very much. Is overweight. Had colonoscopy July 2011. Last Pap smear 2011. Last mammogram on file November 2011. Has been seen by Dr. Stann Mainland has recommended CPAP for sleep apnea.  Family history of mother died of diabetes. Father died of kidney failure.  History of thrombosed hemorrhoid incised by surgeon 2005. Saw Dr. Ollen Gross 2004 for counseling with dysthymic disorder.    Review of Systems     Objective:   Physical Exam        Assessment & Plan:

## 2011-07-20 NOTE — Patient Instructions (Signed)
Continue same medications and return in 6 months 

## 2011-11-22 ENCOUNTER — Other Ambulatory Visit: Payer: Self-pay | Admitting: Internal Medicine

## 2011-12-05 ENCOUNTER — Other Ambulatory Visit: Payer: 59 | Admitting: Internal Medicine

## 2011-12-05 DIAGNOSIS — R7303 Prediabetes: Secondary | ICD-10-CM

## 2011-12-05 DIAGNOSIS — E785 Hyperlipidemia, unspecified: Secondary | ICD-10-CM

## 2011-12-05 DIAGNOSIS — Z0000 Encounter for general adult medical examination without abnormal findings: Secondary | ICD-10-CM

## 2011-12-05 LAB — CBC WITH DIFFERENTIAL/PLATELET
Basophils Absolute: 0 10*3/uL (ref 0.0–0.1)
Basophils Relative: 0 % (ref 0–1)
HCT: 36.4 % (ref 36.0–46.0)
Hemoglobin: 12.3 g/dL (ref 12.0–15.0)
Lymphocytes Relative: 33 % (ref 12–46)
MCHC: 33.8 g/dL (ref 30.0–36.0)
Monocytes Relative: 7 % (ref 3–12)
Neutro Abs: 4.9 10*3/uL (ref 1.7–7.7)
Neutrophils Relative %: 59 % (ref 43–77)
RBC: 4.14 MIL/uL (ref 3.87–5.11)
WBC: 8.4 10*3/uL (ref 4.0–10.5)

## 2011-12-05 LAB — COMPREHENSIVE METABOLIC PANEL
AST: 18 U/L (ref 0–37)
Albumin: 3.8 g/dL (ref 3.5–5.2)
Alkaline Phosphatase: 107 U/L (ref 39–117)
Calcium: 9.2 mg/dL (ref 8.4–10.5)
Chloride: 106 mEq/L (ref 96–112)
Glucose, Bld: 80 mg/dL (ref 70–99)
Potassium: 4.4 mEq/L (ref 3.5–5.3)
Sodium: 139 mEq/L (ref 135–145)
Total Protein: 7.4 g/dL (ref 6.0–8.3)

## 2011-12-05 LAB — LIPID PANEL: LDL Cholesterol: 158 mg/dL — ABNORMAL HIGH (ref 0–99)

## 2011-12-05 LAB — TSH: TSH: 3.788 u[IU]/mL (ref 0.350–4.500)

## 2011-12-06 LAB — HEMOGLOBIN A1C
Hgb A1c MFr Bld: 5.5 % (ref <5.7)
Mean Plasma Glucose: 111 mg/dL (ref <117)

## 2011-12-12 ENCOUNTER — Ambulatory Visit (INDEPENDENT_AMBULATORY_CARE_PROVIDER_SITE_OTHER): Payer: 59 | Admitting: Internal Medicine

## 2011-12-12 ENCOUNTER — Encounter: Payer: Self-pay | Admitting: Internal Medicine

## 2011-12-12 VITALS — BP 108/68 | HR 76 | Temp 98.3°F | Ht 62.0 in | Wt 181.0 lb

## 2011-12-12 DIAGNOSIS — R7309 Other abnormal glucose: Secondary | ICD-10-CM

## 2011-12-12 DIAGNOSIS — R7302 Impaired glucose tolerance (oral): Secondary | ICD-10-CM

## 2011-12-12 DIAGNOSIS — I10 Essential (primary) hypertension: Secondary | ICD-10-CM

## 2011-12-12 DIAGNOSIS — E785 Hyperlipidemia, unspecified: Secondary | ICD-10-CM

## 2011-12-12 DIAGNOSIS — Z0000 Encounter for general adult medical examination without abnormal findings: Secondary | ICD-10-CM

## 2011-12-12 LAB — POCT URINALYSIS DIPSTICK
Blood, UA: NEGATIVE
Nitrite, UA: NEGATIVE
Protein, UA: NEGATIVE
Urobilinogen, UA: NEGATIVE
pH, UA: 6

## 2012-04-16 ENCOUNTER — Other Ambulatory Visit: Payer: Self-pay | Admitting: Internal Medicine

## 2012-04-16 DIAGNOSIS — Z1231 Encounter for screening mammogram for malignant neoplasm of breast: Secondary | ICD-10-CM

## 2012-04-22 ENCOUNTER — Ambulatory Visit
Admission: RE | Admit: 2012-04-22 | Discharge: 2012-04-22 | Disposition: A | Discharge status: Home / Self Care | Payer: 59 | Source: Ambulatory Visit | Attending: Internal Medicine | Admitting: Internal Medicine

## 2012-04-22 DIAGNOSIS — Z1231 Encounter for screening mammogram for malignant neoplasm of breast: Secondary | ICD-10-CM

## 2012-05-22 ENCOUNTER — Encounter: Payer: Self-pay | Admitting: Internal Medicine

## 2012-05-22 DIAGNOSIS — R7302 Impaired glucose tolerance (oral): Secondary | ICD-10-CM | POA: Insufficient documentation

## 2012-05-22 NOTE — Progress Notes (Signed)
Subjective:    Patient ID: [name], female    DOB: December 21, 1950, 62 y.o.   MRN: [id number]  HPI 62 year old black female presents for health maintenance and evaluation of medical problems. She has a history of hypertension hyperlipidemia anxiety osteoarthritis sleep apnea and glucose intolerance. She is on Crestor, when necessary Xanax, vitamin D supplement. History of innocent cardiac murmur.  No known drug allergies.  Had ganglion cyst removed from left hand 1989  Bilateral tubal ligation 1983  Right knee replacement February 2010  Last colonoscopy July 2011  Tetanus immunization update June 2004  Patient is a Occupational psychologist for [employer]. Husband is an Art gallery manager with [employer]. 2 adult daughters and one son. Does not smoke. Very occasional alcohol consumption.  Family history: Father with history of hypertension died with renal failure and congestive heart failure. He also had a history of subdural hematoma and was 61 years old when he died. Mother died at age 44 of diabetes and hypertension. One brother with history of hypertension on dialysis. 2 sisters one alive with history of colon cancer. Younger sister died in Sep 08, 1994 of a cerebral hemorrhage.  History of iron deficiency anemia related to menorrhagia.  Sleep apnea diagnosed in September 2008 by Dr. Shelle Iron  Thrombosed hemorrhoid with excision by Dr. Derrell Lolling 2005  Allergy tested by Dr. [place] Callas Sep 07, 2005. Patient had negative allergy testing.. Also saw ENT physician for chronic sinus problems. Was felt to have GE reflux. CT scan of sinuses was normal.  In 2008/09/07 iron level was 48 normal being 42-145. Hemoglobin A1c has been as high as 6% in 09-08-06. This is treated with diet only.         Review of Systems  Constitutional: Positive for fatigue.  HENT: Positive for congestion.   Eyes:       History of dacryoadenitis  Cardiovascular: Negative.   Gastrointestinal: Negative.   Genitourinary: Negative.    Musculoskeletal: Positive for arthralgias.  Neurological: Negative.   Hematological: Negative.   Psychiatric/Behavioral: Negative.        Objective:   Physical Exam  Vitals reviewed. Constitutional: She is oriented to person, place, and time. She appears well-developed and well-nourished. No distress.  HENT:  Head: Atraumatic.  Right Ear: External ear normal.  Left Ear: External ear normal.  Mouth/Throat: Oropharynx is clear and moist.  Eyes: Conjunctivae normal and EOM are normal. Pupils are equal, round, and reactive to light. Right eye exhibits no discharge. Left eye exhibits no discharge. No scleral icterus.  Neck: Neck supple. No JVD present. No thyromegaly present.  Cardiovascular: Normal rate, regular rhythm and intact distal pulses.   Murmur heard.      2/6 systolic ejection murmur-evaluated by 2-D echocardiogram 1995/09/08 which was negative.  Pulmonary/Chest: Effort normal and breath sounds normal. No respiratory distress. She has no wheezes. She has no rales. She exhibits no tenderness.       Breasts normal female  Abdominal: Soft. Bowel sounds are normal. She exhibits no distension and no mass. There is no tenderness. There is no rebound and no guarding.  Genitourinary: Vagina normal and uterus normal.  Musculoskeletal: Normal range of motion. She exhibits no edema.  Lymphadenopathy:    She has no cervical adenopathy.  Neurological: She is alert and oriented to person, place, and time. She has normal reflexes. She displays normal reflexes. No cranial nerve deficit. Coordination normal.  Skin: Skin is warm and dry. She is not diaphoretic. No erythema.  Psychiatric: She has a normal mood and affect.  Her behavior is normal. Judgment and thought content normal.          Assessment & Plan:  History of innocent cardiac murmur negative 2-D echocardiogram 1997  Hypertension-stable on current regimen  Hyperlipidemia-stable on Crestor  Impaired glucose tolerance-stable with  diet  Sleep apnea previously evaluated in CPAP recommended if patient cannot lose weight  Obesity  GE reflux  Plan: Continue same medications and return in 6 months for office visit blood pressure check lipid panel liver functions and hemoglobin A1c

## 2012-05-22 NOTE — Patient Instructions (Addendum)
Continue same medications and return in 6 months 

## 2012-06-11 ENCOUNTER — Other Ambulatory Visit: Payer: 59 | Admitting: Internal Medicine

## 2012-06-11 DIAGNOSIS — E785 Hyperlipidemia, unspecified: Secondary | ICD-10-CM

## 2012-06-11 DIAGNOSIS — Z79899 Other long term (current) drug therapy: Secondary | ICD-10-CM

## 2012-06-11 LAB — LIPID PANEL
HDL: 50 mg/dL (ref >39)
LDL Cholesterol: 195 mg/dL — ABNORMAL HIGH (ref 0–99)
Total CHOL/HDL Ratio: 5.3 Ratio

## 2012-06-11 LAB — HEPATIC FUNCTION PANEL
Albumin: 4 g/dL (ref 3.5–5.2)
Total Bilirubin: 0.6 mg/dL (ref 0.3–1.2)

## 2012-06-18 ENCOUNTER — Ambulatory Visit (INDEPENDENT_AMBULATORY_CARE_PROVIDER_SITE_OTHER): Payer: 59 | Admitting: Internal Medicine

## 2012-06-18 ENCOUNTER — Encounter: Payer: Self-pay | Admitting: Internal Medicine

## 2012-06-18 VITALS — BP 128/80 | HR 68 | Temp 98.2°F | Wt 196.0 lb

## 2012-06-18 DIAGNOSIS — E8881 Metabolic syndrome: Secondary | ICD-10-CM

## 2012-06-19 ENCOUNTER — Encounter: Payer: Self-pay | Admitting: Internal Medicine

## 2012-06-20 DIAGNOSIS — E8881 Metabolic syndrome: Secondary | ICD-10-CM | POA: Insufficient documentation

## 2012-06-20 NOTE — Patient Instructions (Addendum)
Stops Crestor. Please get serious about diet exercise and weight loss. Followup in 6 months.

## 2012-06-20 NOTE — Progress Notes (Signed)
  Subjective:    Patient ID: Laneta Simmers, female    DOB: 08/25/1950, 62 y.o.   MRN: [id number]  HPI 62 -year-old Black female  and hyperlipidemia, prediabetes, sleep apnea, depression in today for six-month recheck. Patient says she is unable to tolerate Crestor due to myalgias. Also was unable to tolerate Zocor because of myalgias. Doesn't seem to be motivated to diet and exercise. Hemoglobin A1c August 2013 was 5.5% in August 2012 was 5.8%. In 2010 she was anemic with hemoglobin 10.1 g secondary to menorrhagia but this  resolved with menopause. She's worried about her kidney functions because her son apparently has been told he may need to have a kidney transplant in the near future. She is wondering about being tested as a donor. Explained to her that his physician would be looking into possible donor from family members at the appropriate time. However, with her medical problems it is unlikely she would be a suitable donor. Said she is unable to tolerate statin medication, her best option at this point would be to get serious about diet and exercise.  Patient stopped taking Crestor and lipid panel reflects that with abnormal total cholesterol and LDL cholesterol. Liver functions are normal but of course she was off statin therapy.    Review of Systems     Objective:   Physical Exam Neck is supple without JVD thyromegaly or carotid bruits. Chest clear to auscultation. Cardiac exam regular rate and rhythm. Extremities without edema.        Assessment & Plan:  Hypertension-stable  Hyperlipidemia-unable to tolerate to statin medications. Discontinue Crestor. Recheck lipids in 6 months.  History of impaired glucose tolerance  Sleep apnea  Obesity  Metabolic syndrome  Depression  Plan: Reevaluate in 6 months at time of physical exam.

## 2012-09-09 ENCOUNTER — Ambulatory Visit (INDEPENDENT_AMBULATORY_CARE_PROVIDER_SITE_OTHER): Payer: 59 | Admitting: Internal Medicine

## 2012-09-09 ENCOUNTER — Encounter: Payer: Self-pay | Admitting: Internal Medicine

## 2012-09-09 VITALS — BP 136/72 | HR 80 | Temp 98.6°F | Wt 196.0 lb

## 2012-09-09 DIAGNOSIS — I10 Essential (primary) hypertension: Secondary | ICD-10-CM

## 2012-09-09 DIAGNOSIS — R7301 Impaired fasting glucose: Secondary | ICD-10-CM

## 2012-09-09 LAB — BASIC METABOLIC PANEL
BUN: 10 mg/dL (ref 6–23)
Potassium: 4.1 mEq/L (ref 3.5–5.3)
Sodium: 138 mEq/L (ref 135–145)

## 2012-09-10 LAB — HEMOGLOBIN A1C: Hgb A1c MFr Bld: 5.4 % (ref <5.7)

## 2012-09-15 NOTE — Patient Instructions (Addendum)
Take Xanax as needed for anxiety.

## 2012-09-15 NOTE — Progress Notes (Signed)
  Subjective:    Patient ID: [name], female    DOB: Jan 07, 1951, 62 y.o.   MRN: [id number]  HPI 62 year old black female with history of hypertension, anxiety , hyperlipidemia, glucose intolerance in today stating that her son is hospitalized in [place] with kidney failure. He will need to start dialysis. He formerly was a Geophysicist/field seismologist. He has had diabetes for many years. She's leaving tomorrow to go up there to be with him. She's anxious about all of this and quite worried about his health.    Review of Systems     Objective:   Physical Exam not examined today        Assessment & Plan:  Spent 20 minutes speaking with patient about issues surrounding her son's illness and her anxiety.   Impression:  Hypertension-stable on current treatment  Anxiety-prescribe Xanax 0.5 mg one or 2 tablets twice daily as needed for anxiety  Hyperlipidemia-stable on lipid-lowering medication.  Impaired glucose tolerance-patient would like hemoglobin A1c drawn today  Plan: Continue every 6 month followup of medical issues

## 2012-12-17 ENCOUNTER — Other Ambulatory Visit: Payer: 59 | Admitting: Internal Medicine

## 2012-12-17 DIAGNOSIS — Z130 Encounter for screening for diseases of the blood and blood-forming organs and certain disorders involving the immune mechanism: Secondary | ICD-10-CM

## 2012-12-17 DIAGNOSIS — E785 Hyperlipidemia, unspecified: Secondary | ICD-10-CM

## 2012-12-17 DIAGNOSIS — R7301 Impaired fasting glucose: Secondary | ICD-10-CM

## 2012-12-17 DIAGNOSIS — Z79899 Other long term (current) drug therapy: Secondary | ICD-10-CM

## 2012-12-17 DIAGNOSIS — I10 Essential (primary) hypertension: Secondary | ICD-10-CM

## 2012-12-17 DIAGNOSIS — Z1329 Encounter for screening for other suspected endocrine disorder: Secondary | ICD-10-CM

## 2012-12-17 LAB — COMPREHENSIVE METABOLIC PANEL
ALT: 16 U/L (ref 0–35)
Albumin: 4 g/dL (ref 3.5–5.2)
CO2: 25 mEq/L (ref 19–32)
Calcium: 9.5 mg/dL (ref 8.4–10.5)
Chloride: 104 mEq/L (ref 96–112)
Creat: 0.7 mg/dL (ref 0.50–1.10)
Potassium: 4 mEq/L (ref 3.5–5.3)
Total Protein: 7.7 g/dL (ref 6.0–8.3)

## 2012-12-17 LAB — CBC WITH DIFFERENTIAL/PLATELET
Eosinophils Relative: 3 % (ref 0–5)
HCT: 37 % (ref 36.0–46.0)
Lymphocytes Relative: 35 % (ref 12–46)
Lymphs Abs: 3.1 10*3/uL (ref 0.7–4.0)
MCV: 88.9 fL (ref 78.0–100.0)
Platelets: 354 10*3/uL (ref 150–400)
RBC: 4.16 MIL/uL (ref 3.87–5.11)
WBC: 8.6 10*3/uL (ref 4.0–10.5)

## 2012-12-17 LAB — LIPID PANEL
Cholesterol: 278 mg/dL — ABNORMAL HIGH (ref 0–200)
Total CHOL/HDL Ratio: 5.1 Ratio

## 2012-12-18 LAB — HEMOGLOBIN A1C
Hgb A1c MFr Bld: 5.7 % — ABNORMAL HIGH (ref <5.7)
Mean Plasma Glucose: 117 mg/dL — ABNORMAL HIGH (ref <117)

## 2012-12-18 LAB — VITAMIN D 25 HYDROXY (VIT D DEFICIENCY, FRACTURES): Vit D, 25-Hydroxy: 31 ng/mL (ref 30–89)

## 2012-12-22 ENCOUNTER — Other Ambulatory Visit: Payer: Self-pay | Admitting: Internal Medicine

## 2012-12-24 ENCOUNTER — Ambulatory Visit (INDEPENDENT_AMBULATORY_CARE_PROVIDER_SITE_OTHER): Payer: 59 | Admitting: Internal Medicine

## 2012-12-24 ENCOUNTER — Encounter: Payer: Self-pay | Admitting: Internal Medicine

## 2012-12-24 VITALS — BP 112/64 | HR 60 | Temp 97.9°F | Ht 62.75 in | Wt 197.0 lb

## 2012-12-24 DIAGNOSIS — I10 Essential (primary) hypertension: Secondary | ICD-10-CM

## 2012-12-24 DIAGNOSIS — E669 Obesity, unspecified: Secondary | ICD-10-CM

## 2012-12-24 DIAGNOSIS — E8881 Metabolic syndrome: Secondary | ICD-10-CM

## 2012-12-24 DIAGNOSIS — Z0000 Encounter for general adult medical examination without abnormal findings: Secondary | ICD-10-CM

## 2012-12-24 DIAGNOSIS — R010 Benign and innocent cardiac murmurs: Secondary | ICD-10-CM

## 2012-12-24 DIAGNOSIS — R7302 Impaired glucose tolerance (oral): Secondary | ICD-10-CM

## 2012-12-24 DIAGNOSIS — F411 Generalized anxiety disorder: Secondary | ICD-10-CM

## 2012-12-24 DIAGNOSIS — Z23 Encounter for immunization: Secondary | ICD-10-CM

## 2012-12-24 DIAGNOSIS — G4733 Obstructive sleep apnea (adult) (pediatric): Secondary | ICD-10-CM

## 2012-12-24 DIAGNOSIS — E785 Hyperlipidemia, unspecified: Secondary | ICD-10-CM

## 2012-12-24 DIAGNOSIS — R7309 Other abnormal glucose: Secondary | ICD-10-CM

## 2012-12-24 LAB — POCT URINALYSIS DIPSTICK
Leukocytes, UA: NEGATIVE
Nitrite, UA: NEGATIVE
Protein, UA: NEGATIVE
Urobilinogen, UA: NEGATIVE
pH, UA: 5.5

## 2012-12-24 MED ORDER — TETANUS-DIPHTH-ACELL PERTUSSIS 5-2.5-18.5 LF-MCG/0.5 IM SUSP: 0.5000 mL | Freq: Once | INTRAMUSCULAR | Status: DC

## 2012-12-24 MED ORDER — ROSUVASTATIN CALCIUM 10 MG PO TABS: 10.0000 mg | ORAL_TABLET | Freq: Every day | ORAL | Status: DC

## 2013-01-03 ENCOUNTER — Other Ambulatory Visit: Payer: Self-pay

## 2013-01-03 MED ORDER — ROSUVASTATIN CALCIUM 10 MG PO TABS: 10.0000 mg | ORAL_TABLET | Freq: Every day | ORAL | Status: DC

## 2013-01-06 ENCOUNTER — Other Ambulatory Visit: Payer: Self-pay

## 2013-05-06 ENCOUNTER — Ambulatory Visit (INDEPENDENT_AMBULATORY_CARE_PROVIDER_SITE_OTHER): Payer: 59 | Admitting: Internal Medicine

## 2013-05-06 ENCOUNTER — Encounter: Payer: Self-pay | Admitting: Internal Medicine

## 2013-05-06 VITALS — BP 140/84 | HR 80 | Wt 193.0 lb

## 2013-05-06 DIAGNOSIS — M62838 Other muscle spasm: Secondary | ICD-10-CM

## 2013-05-06 DIAGNOSIS — M25519 Pain in unspecified shoulder: Secondary | ICD-10-CM

## 2013-05-06 MED ORDER — HYDROCODONE-ACETAMINOPHEN 10-325 MG PO TABS
1.0000 | ORAL_TABLET | Freq: Three times a day (TID) | ORAL | Status: DC | PRN
Start: 2013-05-06 — End: 2014-01-06

## 2013-05-06 MED ORDER — CYCLOBENZAPRINE HCL 10 MG PO TABS: 10.0000 mg | ORAL_TABLET | Freq: Three times a day (TID) | ORAL | Status: DC | PRN

## 2013-05-06 MED ORDER — METHYLPREDNISOLONE ACETATE 80 MG/ML IJ SUSP
80.0000 mg | Freq: Once | INTRAMUSCULAR | Status: AC
Start: 2013-05-06 — End: 2013-05-06
  Administered 2013-05-06: 80 mg via INTRAMUSCULAR

## 2013-05-06 NOTE — Patient Instructions (Signed)
You have been given injection of Depo-Medrol, Marcaine, Xylocaine. Take Flexeril 10 mg 3 times daily and Norco 10/325 every 6-8 hours when necessary pain. Apply ice to shoulder area twice daily for 20 minutes. Call if not better next week.

## 2013-05-06 NOTE — Progress Notes (Signed)
   Subjective:    Patient ID: [name], female    DOB: 11-Jun-1950, 63 y.o.   MRN: [id number]  HPI Patient was vacuuming earlier this week, began to have pain and right trapezius muscle area radiating down her right arm. Previous history and 2013 of left wrist pain. She had an elevated sedimentation rate of 53 and positive CCP antibodies at that time. She saw Dr. Teressa SenterSypher. She was supposed to see rheumatologist but I'm not sure she ever went. Says this feels more like muscle spasm rather frozen shoulder or arthritis type pain. No relief with over-the-counter medication or heat. Onset was acute after vacuuming. She did a lot of vacuuming.    Review of Systems     Objective:   Physical Exam she has palpable muscle spasm right trapezius muscle area. Some tenderness and posterior glenohumeral joint as well. Good range of motion in right shoulder.        Assessment & Plan:  Trapezius muscle strain  Plan: Trapezius muscle injected with Depo-Medrol, Marcaine, Xylocaine. Prescribe Flexeril 10 mg 3 times a day and Norco 10/325 every 6-8 hours when necessary pain. Apply ice to trapezius muscle 20 minutes twice daily. Call if not better next week.  We will check with patient and see if she ever sought rheumatology consultation. If not, we will repeat rheumatology studies.

## 2013-05-27 ENCOUNTER — Other Ambulatory Visit: Payer: Self-pay

## 2013-05-27 DIAGNOSIS — Z1231 Encounter for screening mammogram for malignant neoplasm of breast: Secondary | ICD-10-CM

## 2013-06-03 ENCOUNTER — Ambulatory Visit
Admission: RE | Admit: 2013-06-03 | Discharge: 2013-06-03 | Disposition: A | Discharge status: Home / Self Care | Payer: 59 | Source: Ambulatory Visit

## 2013-06-03 DIAGNOSIS — Z1231 Encounter for screening mammogram for malignant neoplasm of breast: Secondary | ICD-10-CM

## 2013-06-10 ENCOUNTER — Other Ambulatory Visit: Payer: Self-pay | Admitting: Internal Medicine

## 2013-06-12 NOTE — Patient Instructions (Signed)
Please try the diet and exercise. Continue same medications and return in 6 months

## 2013-06-12 NOTE — Progress Notes (Signed)
Subjective:    Patient ID: [name], female    DOB: [birth year], 63 y.o.   MRN: [id number]  HPI 63 year old Black female for health maintenance and evaluation of medical issues. History of hypertension, hyperlipidemia, obesity, anxiety, glucose intolerance, metabolic syndrome, osteoarthritis, sleep apnea. She has a history of innocent cardiac murmur. Was told at one time to have this inflammatory arthritis but never followed up with repeat testing.  No known drug allergies  Ganglion cyst removed from left hand 1989  Bilateral tubal ligation 1983  Right knee replacement February 2010  History of iron deficiency anemia in the past related to menorrhagia that resolved with menopause.  Sleep apnea diagnosed in September 22, 2006  Thrombosed hemorrhoid excised by Dr. Derrell Lolling in 09/22/03  Allergy tested by Dr. [place] Callas in 09/21/2005. Patient had negative allergy testing. She also saw an ENT physician at that time for chronic sinus problems. Was felt to have GE reflux. CT of the sinuses was normal.  Hemoglobin A1c has been in the 6% range. This is been treated with diet only.  Colonoscopy July 2011  Tetanus immunization June 2004.  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 2 adult daughters and one son. Does not smoke. Occasional alcohol consumption.  Family history: Father with history of hypertension died of renal failure and congestive heart failure. He also had a history of subdural hematoma and was 62 years old when he died. Mother died at age 58 of diabetes and hypertension. One brother with history of hypertension on dialysis. 2 sisters : one of whom is living and has history of colon cancer. Younger sister died in 1994/09/22 of a cerebral hemorrhage.    Review of Systems  Constitutional: Positive for fatigue.  Eyes: Negative.   Respiratory:       History of sleep apnea  Allergic/Immunologic: Negative.   Neurological: Negative.     Hematological: Negative.        Objective:   Physical Exam  Vitals reviewed. Constitutional: She is oriented to person, place, and time. She appears well-developed and well-nourished. No distress.  HENT:  Head: Normocephalic and atraumatic.  Right Ear: External ear normal.  Left Ear: External ear normal.  Mouth/Throat: Oropharynx is clear and moist. No oropharyngeal exudate.  Eyes: Conjunctivae and EOM are normal. Pupils are equal, round, and reactive to light. Left eye exhibits no discharge. No scleral icterus.  Neck: Neck supple. No JVD present. No thyromegaly present.  Cardiovascular: Normal rate, regular rhythm, normal heart sounds and intact distal pulses.   No murmur heard. Pulmonary/Chest: Effort normal and breath sounds normal. No respiratory distress. She has no rales. She exhibits no tenderness.  Breasts normal female  Abdominal: Bowel sounds are normal. She exhibits no distension. There is no tenderness. There is no rebound and no guarding.  Genitourinary:  Pap done 09-21-2008. Bimanual normal  Musculoskeletal: Normal range of motion. She exhibits no edema.  Lymphadenopathy:    She has no cervical adenopathy.  Neurological: She is alert and oriented to person, place, and time. She has normal reflexes. No cranial nerve deficit. Coordination normal.  Skin: Skin is dry. No rash noted. She is not diaphoretic.  Psychiatric: She has a normal mood and affect. Her behavior is normal. Judgment and thought content normal.          Assessment & Plan:  Hypertension-stable on current regimen  Hyperlipidemia-stable  Metabolic syndrome  Anxiety  Obesity-not motivated to diet and exercise  History  of benign cardiac murmur  Sleep apnea  Glucose intolerance  Plan: Please try the diet exercise. Return in 6 months. Continue same medication. Have annual mammogram.

## 2013-06-24 ENCOUNTER — Other Ambulatory Visit: Payer: 59 | Admitting: Internal Medicine

## 2013-06-24 DIAGNOSIS — E785 Hyperlipidemia, unspecified: Secondary | ICD-10-CM

## 2013-06-24 DIAGNOSIS — R7301 Impaired fasting glucose: Secondary | ICD-10-CM

## 2013-06-24 DIAGNOSIS — Z79899 Other long term (current) drug therapy: Secondary | ICD-10-CM

## 2013-06-24 LAB — LIPID PANEL
CHOL/HDL RATIO: 4.7 ratio
Cholesterol: 258 mg/dL — ABNORMAL HIGH (ref 0–200)
HDL: 55 mg/dL (ref >39)
LDL CALC: 181 mg/dL — AB (ref 0–99)
TRIGLYCERIDES: 111 mg/dL (ref <150)
VLDL: 22 mg/dL (ref 0–40)

## 2013-06-24 LAB — HEPATIC FUNCTION PANEL
ALBUMIN: 4 g/dL (ref 3.5–5.2)
ALK PHOS: 114 U/L (ref 39–117)
ALT: 18 U/L (ref 0–35)
AST: 17 U/L (ref 0–37)
Bilirubin, Direct: 0.1 mg/dL (ref 0.0–0.3)
Indirect Bilirubin: 0.5 mg/dL (ref 0.2–1.2)
TOTAL PROTEIN: 8.1 g/dL (ref 6.0–8.3)
Total Bilirubin: 0.6 mg/dL (ref 0.2–1.2)

## 2013-06-24 LAB — HEMOGLOBIN A1C
Hgb A1c MFr Bld: 5.2 %
Mean Plasma Glucose: 103 mg/dL

## 2013-07-01 ENCOUNTER — Encounter: Payer: Self-pay | Admitting: Internal Medicine

## 2013-07-01 ENCOUNTER — Ambulatory Visit (INDEPENDENT_AMBULATORY_CARE_PROVIDER_SITE_OTHER): Payer: 59 | Admitting: Internal Medicine

## 2013-07-01 VITALS — BP 138/78 | HR 60 | Temp 98.3°F | Wt 195.0 lb

## 2013-07-01 DIAGNOSIS — Z7189 Other specified counseling: Secondary | ICD-10-CM

## 2013-07-01 DIAGNOSIS — Z23 Encounter for immunization: Secondary | ICD-10-CM

## 2013-07-01 DIAGNOSIS — IMO0002 Reserved for concepts with insufficient information to code with codable children: Secondary | ICD-10-CM

## 2013-07-01 DIAGNOSIS — I10 Essential (primary) hypertension: Secondary | ICD-10-CM

## 2013-07-01 DIAGNOSIS — Z7184 Encounter for health counseling related to travel: Secondary | ICD-10-CM

## 2013-07-01 MED ORDER — ROSUVASTATIN CALCIUM 20 MG PO TABS: 20.0000 mg | ORAL_TABLET | Freq: Every day | ORAL | Status: DC

## 2013-07-01 MED ORDER — CIPROFLOXACIN HCL 500 MG PO TABS: 500.0000 mg | ORAL_TABLET | Freq: Two times a day (BID) | ORAL | Status: DC

## 2013-07-01 MED ORDER — HEPATITIS A VACCINE 1440 EL U/ML IM SUSP: 1.0000 mL | Freq: Once | INTRAMUSCULAR | Status: DC

## 2013-07-01 MED ORDER — PROMETHAZINE HCL 25 MG PO TABS: 25.0000 mg | ORAL_TABLET | Freq: Four times a day (QID) | ORAL | Status: DC | PRN

## 2013-07-01 MED ORDER — HYDROCHLOROTHIAZIDE 25 MG PO TABS: 25.0000 mg | ORAL_TABLET | Freq: Every day | ORAL | Status: DC

## 2013-07-01 NOTE — Patient Instructions (Addendum)
Take Cipro prophylactically on trip to [place]. Increase Crestor to  20 mg daily. Add HCTZ 25 mg daily to Lisinopril. Monitor blood pressure at home on new blood pressure regimen. Return in 3 months. Physical exam due in 6 months.

## 2013-07-02 ENCOUNTER — Encounter: Payer: Self-pay | Admitting: Internal Medicine

## 2013-07-02 NOTE — Progress Notes (Signed)
   Subjective:    Patient ID: [name], female    DOB: 1950/10/21, 63 y.o.   MRN: [id number]  HPI  In today for six-month followup of hypertension, hyperlipidemia, metabolic syndrome, impaired glucose tolerance. She has a history of sleep apnea. She is going to [place] in in the next 2 weeks. Has never had Hepatitis A vaccine. First dose given today. She'll need return in 6 months for  second dose of Hepatitis A vaccine. Patient is on Crestor for hyperlipidemia. Takes lisinopril and Maxzide for hypertension. History of impaired glucose tolerance control with diet. History of sleep apnea. History of inflammatory arthropathy.  Patient says she will be retiring in May. She is excited about this.    Review of Systems     Objective:   Physical Exam Skin warm and dry. Nodes none. Chest clear to auscultation. Cardiac exam regular rate and rhythm. Extremities without edema.        Assessment & Plan:  Travel advice encounter -given prescriptions for Cipro and Phenergan. She should take Cipro starting a day or so prior to departure  for 10 day course. Phenergan if needed for nausea and vomiting. Hepatitis A #1 given today. Second dose needs to be given in 6 months. Hyperlipidemia-total cholesterol improved from 278 to 258. LDL cholesterol improved from 258 to 205. LDL is still too high. Increase Crestor from 10-20 mg daily.  Inflammatory arthropathy-was supposed to followup with Dr. Teressa SenterSypher several months ago Impaired glucose tolerance-hemoglobin A1c 5.2% and it is improved from 6 months ago at 5.8%  Hypertension-patient on lisinopril. Add HCTZ 25 mg daily and call with blood pressure results in 3-4 weeks. Recommend patient not start this regimen nor increased dose of Crestor until after she returns from her trip to [place].  Return in 6 months for physical examination.  30 minutes that with patient

## 2013-08-05 ENCOUNTER — Ambulatory Visit (INDEPENDENT_AMBULATORY_CARE_PROVIDER_SITE_OTHER): Payer: 59 | Admitting: Internal Medicine

## 2013-08-05 ENCOUNTER — Encounter: Payer: Self-pay | Admitting: Internal Medicine

## 2013-08-05 ENCOUNTER — Telehealth: Payer: Self-pay | Admitting: Internal Medicine

## 2013-08-05 VITALS — BP 130/74 | HR 72 | Temp 98.7°F | Wt 186.0 lb

## 2013-08-05 DIAGNOSIS — J019 Acute sinusitis, unspecified: Secondary | ICD-10-CM

## 2013-08-05 DIAGNOSIS — J329 Chronic sinusitis, unspecified: Secondary | ICD-10-CM

## 2013-08-05 MED ORDER — METHYLPREDNISOLONE ACETATE 80 MG/ML IJ SUSP: 80.0000 mg | Freq: Once | INTRAMUSCULAR | Status: DC

## 2013-08-05 MED ORDER — AMOXICILLIN 500 MG PO CAPS: 500.0000 mg | ORAL_CAPSULE | Freq: Three times a day (TID) | ORAL | Status: DC

## 2013-08-05 MED ORDER — BENZONATATE 100 MG PO CAPS: 200.0000 mg | ORAL_CAPSULE | Freq: Three times a day (TID) | ORAL | Status: DC | PRN

## 2013-08-05 NOTE — Telephone Encounter (Signed)
Had a cancellation in our schedule.  Contacted patient to add to schedule TODAY at 4:15.  Patient notified and confirmed.

## 2013-08-06 NOTE — Progress Notes (Signed)
   Subjective:    Patient ID: [name], female    DOB: [birth year], 63 y.o.   MRN: [id number]  HPI Patient came down with an upper respiratory infection several days ago which has lingered and has resulted in nasal stuffiness, cough and congestion. No documented fever. Patient has not felt well for several days. Some discolored nasal drainage. Some maxillary sinus pressure.       Review of Systems     Objective:   Physical Exam HEENT exam: Pharynx is clear. TMs are clear. Neck is supple without significant adenopathy. Chest clear to auscultation. Sounds nasally congested when she speaks       Assessment & Plan:  Impression: Acute sinusitis  Plan: Amoxicillin 500 mg 3 times a day for 10 days. Depo-Medrol 80 mg IM. Tessalon Perles 200 mg 3 times a day when necessary cough. Call if not better in 7-10 days.

## 2013-08-06 NOTE — Patient Instructions (Signed)
Take amoxicillin 500 mg 3 times daily for 10 days. Take Tessalon Perles as needed for cough. Call if not better in 7-10 days.

## 2013-12-30 ENCOUNTER — Other Ambulatory Visit: Payer: 59 | Admitting: Internal Medicine

## 2013-12-30 DIAGNOSIS — I10 Essential (primary) hypertension: Secondary | ICD-10-CM

## 2013-12-30 DIAGNOSIS — E785 Hyperlipidemia, unspecified: Secondary | ICD-10-CM

## 2013-12-30 LAB — HEPATIC FUNCTION PANEL
ALT: 18 U/L (ref 0–35)
AST: 20 U/L (ref 0–37)
Albumin: 4.1 g/dL (ref 3.5–5.2)
Alkaline Phosphatase: 108 U/L (ref 39–117)
BILIRUBIN DIRECT: 0.1 mg/dL (ref 0.0–0.3)
BILIRUBIN INDIRECT: 0.8 mg/dL (ref 0.2–1.2)
Total Bilirubin: 0.9 mg/dL (ref 0.2–1.2)
Total Protein: 7.7 g/dL (ref 6.0–8.3)

## 2013-12-30 LAB — LIPID PANEL
CHOL/HDL RATIO: 5.3 ratio
CHOLESTEROL: 259 mg/dL — AB (ref 0–200)
HDL: 49 mg/dL (ref >39)
LDL Cholesterol: 189 mg/dL — ABNORMAL HIGH (ref 0–99)
Triglycerides: 105 mg/dL (ref <150)
VLDL: 21 mg/dL (ref 0–40)

## 2013-12-30 LAB — BASIC METABOLIC PANEL
BUN: 12 mg/dL (ref 6–23)
CALCIUM: 9.5 mg/dL (ref 8.4–10.5)
CO2: 25 mEq/L (ref 19–32)
Chloride: 103 mEq/L (ref 96–112)
Creat: 0.64 mg/dL (ref 0.50–1.10)
GLUCOSE: 88 mg/dL (ref 70–99)
Potassium: 3.6 mEq/L (ref 3.5–5.3)
SODIUM: 137 meq/L (ref 135–145)

## 2014-01-06 ENCOUNTER — Ambulatory Visit (INDEPENDENT_AMBULATORY_CARE_PROVIDER_SITE_OTHER): Payer: 59 | Admitting: Internal Medicine

## 2014-01-06 ENCOUNTER — Encounter: Payer: Self-pay | Admitting: Internal Medicine

## 2014-01-06 VITALS — BP 130/80 | HR 72 | Ht 62.0 in | Wt 192.0 lb

## 2014-01-06 DIAGNOSIS — R7309 Other abnormal glucose: Secondary | ICD-10-CM

## 2014-01-06 DIAGNOSIS — R7302 Impaired glucose tolerance (oral): Secondary | ICD-10-CM

## 2014-01-06 NOTE — Progress Notes (Addendum)
   Subjective:    Patient ID: [name], female    DOB: 1951-02-07, 63 y.o.   MRN: [id number]  HPI  63 year old Female in today for six-month recheck on hypertension, hyperlipidemia, obesity, impaired glucose tolerance. Weight today is 192 pounds and previously in March was 186 pounds in September 2014 was 197 pounds. She says she's been going to the gym 5 days a week but really not watching diet as strictly as she should. Is not on statin medication. Says she cannot tolerate Crestor.  Total cholesterol is now 259 and previously was 258 in March, and previously was 278 in August 2014.  She is now retired. I'm disappointed she has not been able to lose weight. She doesn't want to restart lipid-lowering medication. I do think that is necessary. Says she's not doing all she can diet-wise.  Declines influenza immunization today.    Review of Systems     Objective:   Physical Exam  Chest clear. Cardiac exam regular rate and rhythm. Extremities without edema      Assessment & Plan:  History of impaired glucose tolerance. Last hemoglobin A1c 5.2%. This was added to her labs today  Hyperlipidemia-patient wants to give another 3 month trial of diet and exercise before considering lipid-lowering medication once again. History of being intolerant of Crestor due to myalgias  Hypertension-stable on current medications  Obesity  Metabolic syndrome  Plan: Return in 3 months for office visit, lipid panel. Patient needs to be serious about controlling medical problems.  Hemoglobin A1c is elevated. I have mailed her a prescription for diabetic test strips lancet and home glucose monitor to monitor twice a day. We will repeat this in 3 months in addition to lipid panel. She needs to take this seriously and watch her diet. Continue exercising exam.

## 2014-01-06 NOTE — Patient Instructions (Signed)
3 month trial of diet exercise and weight loss. Return in 3 months for office visit and lipid panel. Please get flu vaccine.

## 2014-01-07 LAB — HEMOGLOBIN A1C
HEMOGLOBIN A1C: 5.8 % — AB (ref <5.7)
Mean Plasma Glucose: 120 mg/dL — ABNORMAL HIGH (ref <117)

## 2014-04-04 ENCOUNTER — Other Ambulatory Visit: Payer: 59 | Admitting: Internal Medicine

## 2014-04-06 ENCOUNTER — Ambulatory Visit: Payer: 59 | Admitting: Internal Medicine

## 2014-06-23 ENCOUNTER — Ambulatory Visit (INDEPENDENT_AMBULATORY_CARE_PROVIDER_SITE_OTHER): Payer: BLUE CROSS/BLUE SHIELD | Admitting: Internal Medicine

## 2014-06-23 ENCOUNTER — Encounter: Payer: Self-pay | Admitting: Internal Medicine

## 2014-06-23 VITALS — BP 120/80 | HR 70 | Temp 97.8°F | Ht 62.0 in | Wt 187.0 lb

## 2014-06-23 DIAGNOSIS — N390 Urinary tract infection, site not specified: Secondary | ICD-10-CM | POA: Diagnosis not present

## 2014-06-23 DIAGNOSIS — R309 Painful micturition, unspecified: Secondary | ICD-10-CM

## 2014-06-23 DIAGNOSIS — R8290 Unspecified abnormal findings in urine: Secondary | ICD-10-CM | POA: Diagnosis not present

## 2014-06-23 LAB — POCT URINALYSIS DIPSTICK
Bilirubin, UA: NEGATIVE
GLUCOSE UA: NEGATIVE
Ketones, UA: NEGATIVE
NITRITE UA: NEGATIVE
Spec Grav, UA: >=1.03
UROBILINOGEN UA: NEGATIVE
pH, UA: 6

## 2014-06-23 MED ORDER — CIPROFLOXACIN HCL 500 MG PO TABS: 500.0000 mg | ORAL_TABLET | Freq: Two times a day (BID) | ORAL | Status: DC

## 2014-06-23 NOTE — Progress Notes (Signed)
   Subjective:    Patient ID: [name], female    DOB: [birth year], 64 y.o.   MRN: [id number]  HPI  Onset UTI symptoms today. His had left-sided suprapubic pressure. No blood in urine. No fever or shaking chills. Has dysuria. Urinalysis is abnormal. No recent urinary tract infections.    Review of Systems     Objective:   Physical Exam  UA abnormal. Culture taken. No CVA tenderness.      Assessment & Plan:  Acute urinary tract infection  Plan: Cipro 500 mg twice daily for 7 days. May take Azo-Standard over-the-counter as needed for dysuria. Patient had to reschedule physical exam a call son had kidney transplant. She'll make appointment in the near future.

## 2014-06-23 NOTE — Patient Instructions (Signed)
Cipro 500 mg twice daily for 7 days. Azo-Standard over-the-counter as needed. Urine culture sent.

## 2014-06-26 ENCOUNTER — Telehealth: Payer: Self-pay | Admitting: *Deleted

## 2014-06-26 LAB — URINE CULTURE: Colony Count: >=100000

## 2014-06-27 NOTE — Telephone Encounter (Signed)
Reviewed lab results and instructions with patient 

## 2014-07-03 ENCOUNTER — Other Ambulatory Visit (INDEPENDENT_AMBULATORY_CARE_PROVIDER_SITE_OTHER): Payer: BLUE CROSS/BLUE SHIELD | Admitting: Internal Medicine

## 2014-07-03 DIAGNOSIS — Z87440 Personal history of urinary (tract) infections: Secondary | ICD-10-CM | POA: Diagnosis not present

## 2014-07-03 LAB — POCT URINALYSIS DIPSTICK
BILIRUBIN UA: NEGATIVE
Blood, UA: NEGATIVE
GLUCOSE UA: NEGATIVE
KETONES UA: NEGATIVE
Leukocytes, UA: NEGATIVE
Nitrite, UA: NEGATIVE
PH UA: 6
PROTEIN UA: NEGATIVE
Spec Grav, UA: 1.02
UROBILINOGEN UA: NEGATIVE

## 2014-07-03 NOTE — Progress Notes (Signed)
Patient presents today for a urinalysis following treatment for recent UTI. Patient urinalysis normal today.

## 2014-07-10 ENCOUNTER — Other Ambulatory Visit: Payer: Self-pay | Admitting: Internal Medicine

## 2014-08-04 ENCOUNTER — Other Ambulatory Visit: Payer: BLUE CROSS/BLUE SHIELD | Admitting: Internal Medicine

## 2014-08-07 ENCOUNTER — Other Ambulatory Visit: Payer: BLUE CROSS/BLUE SHIELD | Admitting: Internal Medicine

## 2014-08-07 DIAGNOSIS — Z13220 Encounter for screening for lipoid disorders: Secondary | ICD-10-CM

## 2014-08-07 DIAGNOSIS — Z130 Encounter for screening for diseases of the blood and blood-forming organs and certain disorders involving the immune mechanism: Secondary | ICD-10-CM

## 2014-08-07 DIAGNOSIS — Z1329 Encounter for screening for other suspected endocrine disorder: Secondary | ICD-10-CM

## 2014-08-07 DIAGNOSIS — Z0000 Encounter for general adult medical examination without abnormal findings: Secondary | ICD-10-CM

## 2014-08-07 DIAGNOSIS — R7302 Impaired glucose tolerance (oral): Secondary | ICD-10-CM

## 2014-08-07 DIAGNOSIS — Z1321 Encounter for screening for nutritional disorder: Secondary | ICD-10-CM

## 2014-08-07 LAB — COMPLETE METABOLIC PANEL WITH GFR
ALBUMIN: 3.9 g/dL (ref 3.5–5.2)
ALK PHOS: 102 U/L (ref 39–117)
ALT: 20 U/L (ref 0–35)
AST: 19 U/L (ref 0–37)
BILIRUBIN TOTAL: 0.8 mg/dL (ref 0.2–1.2)
BUN: 11 mg/dL (ref 6–23)
CO2: 24 mEq/L (ref 19–32)
Calcium: 9.5 mg/dL (ref 8.4–10.5)
Chloride: 104 mEq/L (ref 96–112)
Creat: 0.64 mg/dL (ref 0.50–1.10)
GFR, Est African American: >89 mL/min
Glucose, Bld: 88 mg/dL (ref 70–99)
POTASSIUM: 3.8 meq/L (ref 3.5–5.3)
Sodium: 138 mEq/L (ref 135–145)
Total Protein: 7.4 g/dL (ref 6.0–8.3)

## 2014-08-07 LAB — CBC WITH DIFFERENTIAL/PLATELET
BASOS PCT: 0 % (ref 0–1)
Basophils Absolute: 0 10*3/uL (ref 0.0–0.1)
EOS ABS: 0.2 10*3/uL (ref 0.0–0.7)
Eosinophils Relative: 2 % (ref 0–5)
HCT: 40.2 % (ref 36.0–46.0)
HEMOGLOBIN: 13 g/dL (ref 12.0–15.0)
Lymphocytes Relative: 36 % (ref 12–46)
Lymphs Abs: 2.8 10*3/uL (ref 0.7–4.0)
MCH: 29.5 pg (ref 26.0–34.0)
MCHC: 32.3 g/dL (ref 30.0–36.0)
MCV: 91.2 fL (ref 78.0–100.0)
MPV: 10.6 fL (ref 8.6–12.4)
Monocytes Absolute: 0.5 10*3/uL (ref 0.1–1.0)
Monocytes Relative: 6 % (ref 3–12)
NEUTROS ABS: 4.3 10*3/uL (ref 1.7–7.7)
Neutrophils Relative %: 56 % (ref 43–77)
Platelets: 361 10*3/uL (ref 150–400)
RBC: 4.41 MIL/uL (ref 3.87–5.11)
RDW: 13.6 % (ref 11.5–15.5)
WBC: 7.7 10*3/uL (ref 4.0–10.5)

## 2014-08-07 LAB — LIPID PANEL
CHOL/HDL RATIO: 4.3 ratio
CHOLESTEROL: 239 mg/dL — AB (ref 0–200)
HDL: 56 mg/dL (ref >=46)
LDL Cholesterol: 165 mg/dL — ABNORMAL HIGH (ref 0–99)
Triglycerides: 91 mg/dL (ref <150)
VLDL: 18 mg/dL (ref 0–40)

## 2014-08-07 LAB — HEMOGLOBIN A1C
Hgb A1c MFr Bld: 5.8 % — ABNORMAL HIGH (ref <5.7)
MEAN PLASMA GLUCOSE: 120 mg/dL — AB (ref <117)

## 2014-08-07 LAB — TSH: TSH: 4.955 u[IU]/mL — AB (ref 0.350–4.500)

## 2014-08-08 LAB — VITAMIN D 25 HYDROXY (VIT D DEFICIENCY, FRACTURES): VIT D 25 HYDROXY: 30 ng/mL (ref 30–100)

## 2014-08-18 ENCOUNTER — Other Ambulatory Visit (HOSPITAL_COMMUNITY)
Admission: RE | Admit: 2014-08-18 | Discharge: 2014-08-18 | Disposition: A | Discharge status: Home / Self Care | Payer: BLUE CROSS/BLUE SHIELD | Source: Ambulatory Visit | Attending: Internal Medicine | Admitting: Internal Medicine

## 2014-08-18 ENCOUNTER — Ambulatory Visit (INDEPENDENT_AMBULATORY_CARE_PROVIDER_SITE_OTHER): Payer: BLUE CROSS/BLUE SHIELD | Admitting: Internal Medicine

## 2014-08-18 ENCOUNTER — Encounter: Payer: Self-pay | Admitting: Internal Medicine

## 2014-08-18 VITALS — BP 110/72 | HR 58 | Temp 98.0°F | Ht 62.0 in | Wt 185.0 lb

## 2014-08-18 DIAGNOSIS — E785 Hyperlipidemia, unspecified: Secondary | ICD-10-CM | POA: Diagnosis not present

## 2014-08-18 DIAGNOSIS — F419 Anxiety disorder, unspecified: Secondary | ICD-10-CM

## 2014-08-18 DIAGNOSIS — E8881 Metabolic syndrome: Secondary | ICD-10-CM | POA: Diagnosis not present

## 2014-08-18 DIAGNOSIS — R7989 Other specified abnormal findings of blood chemistry: Secondary | ICD-10-CM

## 2014-08-18 DIAGNOSIS — E669 Obesity, unspecified: Secondary | ICD-10-CM

## 2014-08-18 DIAGNOSIS — R011 Cardiac murmur, unspecified: Secondary | ICD-10-CM | POA: Diagnosis not present

## 2014-08-18 DIAGNOSIS — F418 Other specified anxiety disorders: Secondary | ICD-10-CM | POA: Diagnosis not present

## 2014-08-18 DIAGNOSIS — I10 Essential (primary) hypertension: Secondary | ICD-10-CM | POA: Diagnosis not present

## 2014-08-18 DIAGNOSIS — R7302 Impaired glucose tolerance (oral): Secondary | ICD-10-CM | POA: Diagnosis not present

## 2014-08-18 DIAGNOSIS — F329 Major depressive disorder, single episode, unspecified: Secondary | ICD-10-CM

## 2014-08-18 DIAGNOSIS — Z0000 Encounter for general adult medical examination without abnormal findings: Secondary | ICD-10-CM

## 2014-08-18 DIAGNOSIS — Z01419 Encounter for gynecological examination (general) (routine) without abnormal findings: Secondary | ICD-10-CM | POA: Diagnosis not present

## 2014-08-18 LAB — POCT URINALYSIS DIPSTICK
BILIRUBIN UA: NEGATIVE
Glucose, UA: NEGATIVE
Ketones, UA: NEGATIVE
Leukocytes, UA: NEGATIVE
NITRITE UA: NEGATIVE
PROTEIN UA: NEGATIVE
RBC UA: NEGATIVE
Spec Grav, UA: 1.015
Urobilinogen, UA: NEGATIVE
pH, UA: 6.5

## 2014-08-18 LAB — TSH: TSH: 2.793 u[IU]/mL (ref 0.350–4.500)

## 2014-08-18 MED ORDER — ROSUVASTATIN CALCIUM 5 MG PO TABS: 5.0000 mg | ORAL_TABLET | Freq: Every day | ORAL | Status: DC

## 2014-08-18 NOTE — Progress Notes (Signed)
Subjective:    Patient ID: [name], female    DOB: 07-06-1950, 64 y.o.   MRN: [id number]  HPI  For CPE and evaluation of medical issues. Has lost 12 pounds dieting and exercising 5 days a week. Not taking Crestor because of fear of long term side effects. TSH is elevated and will need to be repeated. She has a history of glucose intolerance, hyperlipidemia, hypertension, obesity, anxiety, metabolic syndrome, osteoarthritis, sleep apnea. History of innocent cardiac murmur.  No known drug allergies  Past medical history: Ganglion cyst removed from left hand 09-18-1987, bilateral tubal ligation 1983, right knee replacement February 2010, history of iron deficiency anemia in the past related to menorrhagia that resolved with menopause, sleep apnea diagnosed in 09/18/06, thrombosed hemorrhoid excised by Dr. Derrell Lolling in 09/18/2003.  Patient had allergy testing by Dr.  Callas in 09-17-05. This was negative. She also saw an ENT physician at that time for chronic sinusitis. Was thought to have GE reflux. CT of the sinuses was normal.  Hemoglobin A1c has been in the 6% range and has been treated with diet only.  Colonoscopy 09-17-2009.  Social history: She is retired as a Occupational psychologist for [employer]. Husband is an Art gallery manager at [employer]. 2 adult daughters and 1 son. Son had kidney transplant recently. Patient does not smoke. Social alcohol consumption. Son lives in [place]. Daughters live in [place].  Family history: Father with history of hypertension died of renal failure and congestive heart failure. He also had a history of subdural hematoma and was 63 years old when he died. Mother died at age 51 of diabetes and hypertension. One brother with history of hypertension on dialysis. 2 sisters one who is living and has history of colon cancer. Younger sister died in 09-18-1994 of a cerebral hemorrhage.    Review of Systems  Constitutional: Negative.   HENT: Positive for sinus pressure.   Eyes: Negative.    Respiratory: Negative.   Cardiovascular: Negative.  Negative for chest pain and palpitations.  Gastrointestinal: Negative.   Genitourinary: Negative.   Musculoskeletal:       Bilateral knee replacement some soreness  Neurological: Negative.   Hematological: Negative.   Psychiatric/Behavioral: Negative.        Objective:   Physical Exam  Constitutional: She is oriented to person, place, and time. She appears well-developed and well-nourished. No distress.  HENT:  Head: Normocephalic and atraumatic.  Right Ear: External ear normal.  Left Ear: External ear normal.  Mouth/Throat: Oropharynx is clear and moist. No oropharyngeal exudate.  Eyes: Conjunctivae and EOM are normal. Pupils are equal, round, and reactive to light. Right eye exhibits no discharge. Left eye exhibits no discharge. No scleral icterus.  Neck: Neck supple. No JVD present. No thyromegaly present.  Cardiovascular: Normal rate, regular rhythm, normal heart sounds and intact distal pulses.   No murmur heard. Pulmonary/Chest: Effort normal and breath sounds normal. No respiratory distress. She has no wheezes. She has no rales. She exhibits no tenderness.  Abdominal: Soft. Bowel sounds are normal. She exhibits no distension and no mass. There is no tenderness. There is no rebound and no guarding.  Genitourinary:  Pap taken. Bimanual normal.  Musculoskeletal: Normal range of motion. She exhibits no edema.  Lymphadenopathy:    She has no cervical adenopathy.  Neurological: She is alert and oriented to person, place, and time. She has normal reflexes. No cranial nerve deficit. Coordination normal.  Skin: Skin is warm and dry. No rash noted. She is  not diaphoretic.  Psychiatric: She has a normal mood and affect. Her behavior is normal. Judgment and thought content normal.  Vitals reviewed.         Assessment & Plan:  Hypertension-stable on current regimen  Hyperlipidemia-patient will take Crestor 5 mg daily and  return in 3 months. Total cholesterol 239, LDL cholesterol 165, triglycerides 91, HDL cholesterol 56.  Elevated TSH-repeated and found to be within normal limits. Recheck in 6 months  Obesity-working out at gym and has lost 12 pounds  Sleep apnea  Impaired glucose tolerance-check hemoglobin A1c.  Anxiety and depression-stable  Metabolic syndrome    Plan: Start Crestor 5 mg daily and return in 3 months for office visit lipid panel and liver functions.

## 2014-08-21 ENCOUNTER — Telehealth: Payer: Self-pay | Admitting: *Deleted

## 2014-08-22 LAB — CYTOLOGY - PAP

## 2014-08-22 NOTE — Telephone Encounter (Signed)
Left message with lab results and instructions 

## 2014-08-31 ENCOUNTER — Other Ambulatory Visit: Payer: Self-pay

## 2014-08-31 DIAGNOSIS — Z1231 Encounter for screening mammogram for malignant neoplasm of breast: Secondary | ICD-10-CM

## 2014-09-04 ENCOUNTER — Ambulatory Visit
Admission: RE | Admit: 2014-09-04 | Discharge: 2014-09-04 | Disposition: A | Discharge status: Home / Self Care | Payer: BLUE CROSS/BLUE SHIELD | Source: Ambulatory Visit

## 2014-09-04 DIAGNOSIS — Z1231 Encounter for screening mammogram for malignant neoplasm of breast: Secondary | ICD-10-CM

## 2014-11-16 ENCOUNTER — Other Ambulatory Visit: Payer: BLUE CROSS/BLUE SHIELD | Admitting: Internal Medicine

## 2014-11-17 ENCOUNTER — Other Ambulatory Visit: Payer: BLUE CROSS/BLUE SHIELD | Admitting: Internal Medicine

## 2014-11-17 ENCOUNTER — Encounter: Payer: Self-pay | Admitting: Internal Medicine

## 2014-11-17 ENCOUNTER — Ambulatory Visit (INDEPENDENT_AMBULATORY_CARE_PROVIDER_SITE_OTHER): Payer: BLUE CROSS/BLUE SHIELD | Admitting: Internal Medicine

## 2014-11-17 VITALS — BP 122/64 | HR 71 | Temp 97.8°F | Wt 184.5 lb

## 2014-11-17 DIAGNOSIS — E785 Hyperlipidemia, unspecified: Secondary | ICD-10-CM | POA: Diagnosis not present

## 2014-11-17 DIAGNOSIS — E669 Obesity, unspecified: Secondary | ICD-10-CM

## 2014-11-17 DIAGNOSIS — E8881 Metabolic syndrome: Secondary | ICD-10-CM

## 2014-11-17 DIAGNOSIS — R7302 Impaired glucose tolerance (oral): Secondary | ICD-10-CM

## 2014-11-17 DIAGNOSIS — R7309 Other abnormal glucose: Secondary | ICD-10-CM

## 2014-11-17 DIAGNOSIS — I10 Essential (primary) hypertension: Secondary | ICD-10-CM

## 2014-11-17 DIAGNOSIS — R7989 Other specified abnormal findings of blood chemistry: Secondary | ICD-10-CM

## 2014-11-17 DIAGNOSIS — Z79899 Other long term (current) drug therapy: Secondary | ICD-10-CM

## 2014-11-17 LAB — TSH: TSH: 3.3 u[IU]/mL (ref 0.350–4.500)

## 2014-11-17 LAB — HEPATIC FUNCTION PANEL
ALBUMIN: 4 g/dL (ref 3.6–5.1)
ALT: 21 U/L (ref 6–29)
AST: 19 U/L (ref 10–35)
Alkaline Phosphatase: 105 U/L (ref 33–130)
BILIRUBIN DIRECT: 0.1 mg/dL (ref <=0.2)
BILIRUBIN TOTAL: 0.7 mg/dL (ref 0.2–1.2)
Indirect Bilirubin: 0.6 mg/dL (ref 0.2–1.2)
TOTAL PROTEIN: 7.5 g/dL (ref 6.1–8.1)

## 2014-11-17 LAB — LIPID PANEL
CHOL/HDL RATIO: 4.9 ratio (ref <=5.0)
Cholesterol: 239 mg/dL — ABNORMAL HIGH (ref 125–200)
HDL: 49 mg/dL (ref >=46)
LDL Cholesterol: 169 mg/dL — ABNORMAL HIGH (ref <130)
TRIGLYCERIDES: 106 mg/dL (ref <150)
VLDL: 21 mg/dL (ref <30)

## 2014-11-17 NOTE — Patient Instructions (Signed)
Labs drawn and pending. Return in 3 months for six-month recheck. Continue diet and exercise regimen. Continue Crestor 5 mg daily.

## 2014-11-17 NOTE — Progress Notes (Signed)
   Subjective:    Patient ID: [name], female    DOB: 06-Nov-1950, 64 y.o.   MRN: [id number]  HPI Here today for three-month follow-up. She just had her labs drawn this morning. Says she's been eating about 1000 cal a day and going to the gym frequently. However she is only lost a half pound since last visit. At one point she had an elevated TSH which was repeated and was found to be within normal limits. We will add this to her labs today. She's here today to follow-up on hyperlipidemia. Her blood pressure is normal and stable on current regimen. She is on Crestor 5 mg daily. No complaints. I think she is probably eating too much fruit. Advised her to write down what she is eating on a daily basis and to check caloric consumption on line.   Review of Systems     Objective:   Physical Exam  Skin warm and dry. Nodes none. No JVD thyromegaly or carotid bruits. Chest clear to auscultation cardiac exam regular rate and rhythm 2/6 systolic ejection murmur unchanged from previous exam. Extremities without edema      Assessment & Plan:  Impaired glucose tolerance  Hyperlipidemia  Essential hypertension  Obesity  Metabolic syndrome  Innocent cardiac murmur  History of elevated TSH  Plan: Lipid panel liver functions drawn today on Crestor. Check hemoglobin A1c and TSH. Ordinarily would be due for recheck in October ( six-month recheck) CPE due April.

## 2014-11-17 NOTE — Addendum Note (Signed)
Addended by: Thomasena Edis on: 11/17/2014 11:34 AM   Modules accepted: Orders

## 2014-11-18 LAB — HEMOGLOBIN A1C
HEMOGLOBIN A1C: 5.7 % — AB (ref <5.7)
Mean Plasma Glucose: 117 mg/dL — ABNORMAL HIGH (ref <117)

## 2014-11-20 ENCOUNTER — Telehealth: Payer: Self-pay | Admitting: *Deleted

## 2014-11-20 NOTE — Telephone Encounter (Signed)
Left message for patient to return call.

## 2014-11-21 NOTE — Telephone Encounter (Signed)
We have tried diet before- not seeing much difference. Note that pt refusing lipid lowering medication.

## 2014-11-21 NOTE — Telephone Encounter (Signed)
Patient called back she is not taking crestor and states she is not going to take this medication. Patient states she would like to modify her diet instead.

## 2014-11-29 ENCOUNTER — Telehealth: Payer: Self-pay | Admitting: Internal Medicine

## 2014-11-29 NOTE — Telephone Encounter (Signed)
Called patient back and provided her with Dr. Jani Files name and number:  [facility] Neuro[facility]ologist @ [phone].

## 2014-11-29 NOTE — Telephone Encounter (Signed)
Patient calls requesting a physician's name/number that was provided to her in a past visit for "memory loss".  States this was written on a "BLUE" piece of paper and given to her.  This was given to her perhaps well over a year ago.  I have gone through her 57 and 2016 notes and do not see any mention of memory loss in any of those visits, nor do I see any phone notes indicating memory loss referrals.  Do you want the patient to make an appointment to come in to discuss this since it has been so long ago?  States she carried the piece of paper around in her car in the visor for the longest time and has now misplaced it.    Please advise.  Thanks.

## 2014-11-29 NOTE — Telephone Encounter (Signed)
I do not know what the issues are but you can provide Dr. Maxwell Marion name for neuropsychological testing. She can call and see if they can meet her needs.

## 2014-12-26 ENCOUNTER — Other Ambulatory Visit: Payer: Self-pay | Admitting: *Deleted

## 2014-12-26 MED ORDER — LISINOPRIL 10 MG PO TABS: 10.0000 mg | ORAL_TABLET | Freq: Every day | ORAL | Status: DC

## 2015-03-22 ENCOUNTER — Ambulatory Visit: Payer: BLUE CROSS/BLUE SHIELD | Attending: Psychology | Admitting: Psychology

## 2015-03-22 DIAGNOSIS — R413 Other amnesia: Secondary | ICD-10-CM | POA: Diagnosis not present

## 2015-03-22 DIAGNOSIS — F329 Major depressive disorder, single episode, unspecified: Secondary | ICD-10-CM | POA: Insufficient documentation

## 2015-03-22 DIAGNOSIS — G3184 Mild cognitive impairment, so stated: Secondary | ICD-10-CM | POA: Insufficient documentation

## 2015-03-22 NOTE — Progress Notes (Signed)
[facility]  [address]   Telephone [phone] [address] Fax [phone] [address] [place], [place] [address]  Initial Contact Note  Name:  [name] Date of Birth; June 20, 1950 MRN:  [id number] Date:  03/22/2015  [name] is an 64 y.o. female who was referred for neuropsychological evaluation by Eden EmmsMary John Baxley due to possible problems with memory.   A total of 6 hours was spent today reviewing medical records, interviewing (CPT [phone]) [name] and administering and scoring neurocognitive tests (CPT [phone] & [phone]).  Preliminary Diagnostic Impression: Memory loss [R41.3]  There were no concerns expressed or behaviors displayed by [name] that would require immediate attention.   A full report will follow once the planned testing has been completed. Her next appointment is scheduled for 03/30/15.   Gladstone PihMichael F. Joanna Borawski, Ph.D Licensed Psychologist 03/22/2015

## 2015-03-30 ENCOUNTER — Ambulatory Visit (INDEPENDENT_AMBULATORY_CARE_PROVIDER_SITE_OTHER): Payer: BLUE CROSS/BLUE SHIELD | Admitting: Psychology

## 2015-03-30 DIAGNOSIS — G3184 Mild cognitive impairment, so stated: Secondary | ICD-10-CM | POA: Diagnosis not present

## 2015-03-30 DIAGNOSIS — F32A Depression, unspecified: Secondary | ICD-10-CM

## 2015-03-30 DIAGNOSIS — F329 Major depressive disorder, single episode, unspecified: Secondary | ICD-10-CM

## 2015-03-30 NOTE — Progress Notes (Addendum)
[facility]  [address]   Telephone [phone] [address] Fax [phone] [place], [place] [address]   NEUROPSYCHOLOGICAL EVALUATION  *CONFIDENTIAL* This report should not be released without the consent of the client  Name:   [name] Date of Birth:  [birth year] [facility] MR#:  [id number] Dates of Evaluation: 03/22/15 & 03/30/15  Reason for Referral [name] is a 64 year-old right-handed woman who was referred for neuropsychological evaluation by Brianna Salina, MD as prompted by her family members' concerns about her memory.   Sources of Information Electronic medical records from the [facility] were reviewed. [name] and her husband, [name], were interviewed.   Chief Complaints [name] stated her belief that her cognitive functioning is normal for her age. In contrast, her husband reported that he, their twin daughters and his sister-in-law agreed that within the past eighteen months [name] has displayed difficulties with memory. He cited examples of her misplacing objects, forgetting appointments, leaving cabinet drawers open, repeating questions three or four times within a matter of a few hours and not recalling recent conversations. Her memory difficulties have occurred intermittently as he estimated that her memory has seemed normal about 75% of the time. He has not discerned any predictable pattern to her good days and bad days. She has continued to perform her basic and complex activities of daily living without difficulty.   Events or life changes that preceded or were coincident to the onset of her memory difficulties included her planned retirement, increased marital tension following her retirement and their son's health decline that necessitated a kidney transplant in 2014 that ultimately failed. She acknowledged having experienced an intense period of worry about their son that eased after he later underwent a  successful kidney transplant in December 2015. She decided she no longer needed to use of sertraline and discontinued use a few months ago.   At this time, neither [name] nor her husband cited any ongoing or new life stressors. She described herself as typically in good spirits though added that sometimes she feels depressed for no apparent reason. She no longer feels anxious about her son or in general. She did cite some recent marital discord, which she attributed to communication difficulties. Her husband described her as seeming quieter and more easily annoyed over the past year. He has not observed any changes in her personality or habits. She has not exhibited any unusual or unsafe behavior.  She did not report any recent changes in her health. She denied problems with eye-hand coordination, balance, limb strength, gait, sleep, daytime alertness, vision, hearing, swallowing, speech, smell, taste or appetite. She did not report being in any pain.  Background Her current medical problems as listed in her medical chart were benign cardiac murmur, essential hypertension, hyperlipidemia, impaired glucose tolerance, metabolic syndrome, obstructive sleep apnea (diagnosed in 2012 by sleep study) and osteoarthritis (knees and hips). She underwent right knee replacement surgery in 2010 and left knee replacement surgery in 2011.  Her current medications include hydrochlorothiazide and lisinopril. She was on sertraline from 2012 until she discontinued it sometime in 2016.    She reported no history of head injury, loss of consciousness, seizure activity, stroke-like symptoms or exposure to toxic chemicals.  She reported rare alcohol consumption. She denied use of illicit drugs She has never used tobacco products.   She reported no prior history of serious emotional difficulties or mental health contacts.  Her family medical history  was notable for a younger sister having died from a cerebral  hemorrhage. She reported no family history of memory loss, dementia or psychiatric disorder.    She lives with her husband of thirty eight years. They have twin daughters and a son.  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 In recent years she had been working from home.   She reported that she was graduated from high school and earned an Fish farm managerAssociate's degree in Business. She stated that she was good student without attentional or learning difficulties.   Evaluation Procedures In addition to review of medical records and interviews, the following tests or questionnaires were administered:  Animal Naming Test Beck Anxiety Inventory Beck Depression Inventory-II Boston Naming Test Controlled Oral Word Association Test Rey Complex Figure: copy Stroop Color and Word Test Trail Making A & B Wechsler Adult Intelligence Scale-IV:  Clinical cytogeneticistBlock Design, Coding, Digit Span, Matrix Reasoning & Similarities   Wechsler Memory Scale-IV  Wide Range Achievement Test-4: Word Reading Wisconsin Card Sorting Test  Assessment Results Validity & Interpretative Considerations  Test results were deemed to represent a valid measure of her current cognitive functioning.  She presented as alert, calm and cooperative. She did not report or display problems with vision (she wore her eyeglasses), hearing or motor control. There were no signs of inadequate effort.    Her baseline intellectual potential was estimated to fall within the Average range based on demographic factors coupled with a measure of word reading (Wide Range Achievement Test-4) that represents an over-learned verbal skill relatively resistant to the effects of neurological disorder or injury.  Her test scores were corrected to reflect norms for her age and, whenever possible, her gender, race (i.e., African-American) and educational level (i.e., 16 years). A listing of test  results can be found at the end of this report.  Attentional & Executive Functions Her speed to transcribe symbols to match digits using a key (Wechsler Adult Intelligence Scale-IV (WAIS-IV) Coding) fell within the Average range. Her speed to draw lines connecting numbers randomly  arrayed on a page in numerical sequence (Trials A) fell within the Low Average range. Her speed to simply read words or name color hues (Stroop Color and Word Test) was subnormal.   Her speed on a complex attentional task that required set shifting (Trails B) in order to complete an ascending and alternating visual sequence of numbers and letters was within the Low Average range.   Her speed to name the print color of a word while simultaneously ignoring the conflicting word (Stroop Color and Word Test), which required selective attention and inhibition of an automatic response, was within the Low Average range.   Her capacity to hold and manipulate information in auditory working memory was Average based on her ability to mentally rearranging digit sequences in reverse or in ascending numerical order (WAIS-IV Digit Span). In contrast, her performances on tests of visual working memory were mixed. Her ability to immediately recall spatial locations within a grid (Wechsler Memory Scale-IV (WMS-IV) Spatial Addition) fell at the lower boundary of the Average range. Her score on a test that required immediately recognizing symbols in left to right order (WMS-IV Symbol Span) was within the Borderline range.    Her ability to generate words to designated letters (Controlled Oral Word Association Test) was within the Average range while her ability to name members of a category (Animal Naming Test) was within the Low  Average range.  Her performance on a test of verbal reasoning that required classification of ostensibly different objects or ideas to a shared category (WAIS-IV Similarities) fell at the lower boundary of the Average  range. Her ability to use nonverbal reasoning to identify the missing element to complete an abstract sequence or matrix of designs (WAIS-IV Matrix Reasoning) fell within the Low Average range.  Despite adequate ability to form abstract concepts, she struggled on a test of problem-solving ([employer]), primarily due to difficulties maintaining her strategy in working memory (i.e., failure of set maintenance).   Learning & Memory She demonstrated impairment of verbal memory. Her WMS-IV Immediate Memory Index (IMI), a composite measure of her ability to recall verbal and visual information immediately after the stimuli was presented, fell within the Borderline range at the 3rd percentile. Her auditory immediate subtest scores varied from the 2nd to 5th percentiles while her immediate visual memory subtest sores were both within the Low Average range at the 16th percentile. Her Delayed Memory Index (DMI), a combined measure of her ability to recall verbal and visual information after a 20 to 30 minute delay, fell within the impaired range at below the 1st percentile. Her DMI was lower than expected given her IMI, which was specific to abnormal forgetting of auditory information. She struggled to recall stories read to her (savings= 13%) and word pairs after a delay. Her delayed auditory recall was somewhat better on a recognition format in which she was presented multiple choices or yes/no questions.  Her delayed visual recall was as expected given her initial level of encoding.   Language Her ability to name drawings of objects to confrontation [facility] [employer]) was within the Average range. A noted above, a measure of her letter fluency was within the Average range.   Visual-Spatial Organization & Visual-Construction  There were no signs of spatial inattention or problems with visual recognition. Her ability to assemble two-dimensional block designs from models Counsellor)  was within the Average range. Her copy of a spatially-complex geometric design (Rey Complex Figure) was without error.  Emotional Status [name] endorsed no symptoms of depression (Beck Depression Inventory-II score=0) and very minimal anxiety (Beck Anxiety Inventory score= 2) on standardized self-report symptom questionnaires. Her lack of symptom reported suggested the possibility of underreporting.   Summary & Conclusions [name] is a 64 year-old woman who was described by her husband and other family members as having shown memory problems over the past eighteen months. In addition, her husband described her as quieter and more easily annoyed over the past year. In contrast, Ms. Boccio stated her belief that her cognitive functioning is normal for her age.  Neuropsychological testing identified a significant deficit for auditory-verbal memory with deficiencies evident at the stages of acquisition, storage and retrieval. In addition, her problem-solving was marred by her inconsistent ability to maintain ideas or strategy within working memory (i.e., deficient set maintenance). Measures of visual memory, semantic fluency, simple and complex visual sequencing, and nonverbal reasoning fell within the Low Average range, which was somewhat lower than expected as compared to her estimated baseline cognitive potential. Finally, well-preserved within the Average range were auditory working memory, confrontation naming, phonemic fluency, visual-spatial organization, complex design copy and abstract verbal reasoning.  With regards to her psychological functioning, it was notable that she endorsed no symptoms at all of depression or psychic anxiety on self-report symptom questionnaires. This is unusual as even contented and well-adjusted persons will usually endorse  at least a few such symptoms. This suggested that she either unconsciously or intentionally under-reported emotional distress. When later asked  about her mood, she acknowledged feeling melancholy over the past few years in reaction to the serious health problems of family members and sometimes for no apparent reason. Observations were notable for her appearing sad and at times near tears.    In conclusion, the main question is whether her memory difficulties are secondary to depression or to an underlying neurodegenerative condition. At this point, the working assumption is that depression is the cause for her deficient memory. Indeed, her husband's report that there have been many days each week during which she has not displayed any memory difficulties would be consistent with a psychiatric etiology for her memory loss. Again, an underlying neurodegenerative condition cannot be ruled out.  Diagnostic Impressions Mild Cognitive Impairment, amnestic type [G31.84] in the setting of unspecified depressive disorder [F32.9]  Recommendations 1. It is advised that she re-start antidepressant medication as medically appropriate. It is hoped that her cognitive functioning will rebound after treatment for depression. She stated that she was "possibly" interested in psychological counseling. She was encouraged to pursue psychological counseling and to speak further about this with [name].  2. Neuroradiogical studies are recommended for clinical correlation. Referral to neurology could be considered for further work-up.  3. The current test results comprise a baseline of her cognitive functioning. A repeat neuropsychological evaluation should be considered in one year to eighteen months (or sooner if clinically indicated) to track interval changes, if any, in her cognitive or emotional functioning.    I have appreciated the opportunity to evaluate [name]. The results and recommendations from this evaluation were discussed with her and her husband on 03/30/15. Please feel free to contact me with any comments or  questions.    ______________________ [name], Ph.D Licensed Psychologist          . ADDENDUM-NEUROPSYCHOLOGICAL TEST RESULTS  Animal Naming Test Score= 12 18th (adjusted for age, gender, race and educational level)    Boston Naming Test Score= [phone] 37th (adjusted for age, gender, race and educational level)    Controlled Oral Word Association Test Score= 39 words/3 repetitions 67th (adjusted for age, gender, race and educational level)    Rey Complex Figure: copy       Score= 36/36  Normal    Stroop Color Word Test  Score Residual % (adjusted for age and educational level)  Word 84 -25   4th    Color 57 -21   4th    Color-Word 34 -8  21st      Trails A Score=  46s  0e 21st  (adjusted for age, gender, race and educational level)  Trails B Score=  159s 2e 13th (adjusted for age, gender, race and educational level)    Wechsler Adult Intelligence Scale-IV  Subtest Scaled Score Percentile  Block Design 10 50th    Similarities   8 25th   Digit Span  Forward                Backward                Sequencing   16th          16th  25th    37th     Matrix Reasoning   7 16th     Coding   10 50th  Wechsler Memory Scale-IV Index Index Score Percentile  Immediate Memory   72    3rd     Auditory Memory   57  <1st      Visual Memory   82 12th      Delayed Memory     61  <1st      Visual Working Memory   80    9th       Wide Range Achievement Test-4 Subtest  Raw score Standard score Percentile  Word Reading 60/70 97 42nd      Wisconsin Card Sorting Test  Total errors= 51 14th (adjusted for age and education)  Perseverative errors= 21 30th     Categories=  4 >16th   Trials to first category= 35 2nd - 5th    Failure to maintain set  3 11th - 16th    Learning to learn= -6.34 >16th

## 2015-05-30 ENCOUNTER — Other Ambulatory Visit: Payer: Self-pay

## 2015-05-30 MED ORDER — SERTRALINE HCL 100 MG PO TABS: 100.0000 mg | ORAL_TABLET | Freq: Every day | ORAL | Status: DC

## 2015-05-30 MED ORDER — TRIAMTERENE-HCTZ 37.5-25 MG PO TABS: 1.0000 | ORAL_TABLET | Freq: Every day | ORAL | Status: DC

## 2015-08-16 ENCOUNTER — Encounter: Payer: Self-pay | Admitting: Internal Medicine

## 2015-08-16 ENCOUNTER — Ambulatory Visit (INDEPENDENT_AMBULATORY_CARE_PROVIDER_SITE_OTHER): Payer: BLUE CROSS/BLUE SHIELD | Admitting: Internal Medicine

## 2015-08-16 VITALS — BP 118/72 | HR 67 | Temp 98.0°F | Resp 18 | Wt 185.5 lb

## 2015-08-16 DIAGNOSIS — F329 Major depressive disorder, single episode, unspecified: Secondary | ICD-10-CM

## 2015-08-16 DIAGNOSIS — F32A Depression, unspecified: Secondary | ICD-10-CM | POA: Insufficient documentation

## 2015-08-16 MED ORDER — ALPRAZOLAM 0.5 MG PO TABS: 0.5000 mg | ORAL_TABLET | Freq: Two times a day (BID) | ORAL | Status: DC | PRN

## 2015-08-16 MED ORDER — SERTRALINE HCL 50 MG PO TABS: 50.0000 mg | ORAL_TABLET | Freq: Every day | ORAL | Status: DC

## 2015-08-16 NOTE — Progress Notes (Signed)
   Subjective:    Patient ID: [name], female    DOB: [birth year], 65 y.o.   MRN: [id number]  HPI Patient is been retired about a year. Her husband also is retired. She has become frustrated over the past few months because they are spending so much time together. She doesn't feel like she has any free time. She feels like he orders her around at times. She does all the meal preparations. They do go to the gym together almost every day. Doesn't feel that she can speak with her husband about this issue. Is beginning to feel depressed and somewhat smothered. Her 2  daughters live in [place]. One is getting married in August.    Review of Systems     Objective:   Physical Exam  Not examined. Spent 25 minutes making the patient about these issues. She does not want to go to counseling. She would like to start an antidepressant and have something on hand for anxiety when she needs it. Explained to her that this was common situation when couples went through retirement. I did encourage her to speak with her husband about her need to have some free time and space.Spent 15 minutes coaching her about the situation as well. Her insight is fairly good.      Assessment & Plan:  Depression  Plan: Zoloft 50 mg daily. Xanax 0.5 mg one half to one twice daily as needed for anxiety. Follow-up in 6 weeks.

## 2015-08-16 NOTE — Patient Instructions (Signed)
Start Zoloft one half tablet daily for a week then increase to 50 mg daily. Takes Xanax sparingly for anxiety. Return in 6 weeks.

## 2015-09-27 ENCOUNTER — Encounter: Payer: Self-pay | Admitting: Internal Medicine

## 2015-09-27 ENCOUNTER — Ambulatory Visit (INDEPENDENT_AMBULATORY_CARE_PROVIDER_SITE_OTHER): Payer: BLUE CROSS/BLUE SHIELD | Admitting: Internal Medicine

## 2015-09-27 VITALS — BP 110/70 | HR 58 | Temp 98.2°F | Resp 18 | Wt 191.0 lb

## 2015-09-27 DIAGNOSIS — R635 Abnormal weight gain: Secondary | ICD-10-CM

## 2015-09-27 DIAGNOSIS — F32A Depression, unspecified: Secondary | ICD-10-CM

## 2015-09-27 DIAGNOSIS — F329 Major depressive disorder, single episode, unspecified: Secondary | ICD-10-CM

## 2015-09-27 LAB — TSH: TSH: 2.48 mIU/L

## 2015-09-27 MED ORDER — LISINOPRIL 10 MG PO TABS: 10.0000 mg | ORAL_TABLET | Freq: Every day | ORAL | Status: DC

## 2015-09-27 MED ORDER — TRIAMTERENE-HCTZ 37.5-25 MG PO TABS: 1.0000 | ORAL_TABLET | Freq: Every day | ORAL | Status: DC

## 2015-09-27 MED ORDER — SERTRALINE HCL 50 MG PO TABS: 50.0000 mg | ORAL_TABLET | Freq: Every day | ORAL | Status: DC

## 2015-09-27 NOTE — Progress Notes (Signed)
   Subjective:    Patient ID: [name], female    DOB: [birth year], 65 y.o.   MRN: [id number]  HPI Patient here to follow-up on depression. She is doing much better having started Zoloft after last visit. Blood pressure is excellent. She's been working out 3-4 days a week. However she eats butter pecan ice cream. She is concerned that she's gained weight. However, my guess is she is taking in more calories than she is burning with exercise. She needs to start watching calories. Suggested Weight Watchers. She will consider it. She eats a salad from Xanax be[employer] with rich dressing. Needs to think in terms of more low-calorie dressing. Says the check-in on the salad is grilled. Told her not to exceed 1800 cal a day. Has not had thyroid checked in a while. TSH drawn today.TSH is within normal limits.    Review of Systems see above     Objective:   Physical Exam  No thyromegaly. Chest clear to auscultation. Cardiac exam regular rate and rhythm. Extremities without edema. Weight is 191 and previously was 185.5 at last visit.      Assessment & Plan:  Depression-better on SSRI  Weight gain-etiology unclear. Perhaps related to dietary indiscretion, needs more exercise or perhaps SSRI although Zoloft doesn't cause weight gain as much is another SSRI medications. TSH checked and is within normal limits.  Hypertension  Hyperlipidemia  Impaired glucose tolerance  Plan: Return in September for physical exam. Continue Zoloft 50 mg daily.

## 2015-10-19 ENCOUNTER — Encounter: Payer: Self-pay | Admitting: Internal Medicine

## 2015-10-19 NOTE — Patient Instructions (Signed)
Continue Zoloft 50 mg daily. TSH checked and is within normal limits. Increase exercise activity and watch diet.

## 2015-12-25 ENCOUNTER — Encounter: Payer: BLUE CROSS/BLUE SHIELD | Admitting: Internal Medicine

## 2015-12-27 ENCOUNTER — Other Ambulatory Visit: Payer: BLUE CROSS/BLUE SHIELD | Admitting: Internal Medicine

## 2015-12-27 DIAGNOSIS — E8881 Metabolic syndrome: Secondary | ICD-10-CM

## 2015-12-27 DIAGNOSIS — R7302 Impaired glucose tolerance (oral): Secondary | ICD-10-CM

## 2015-12-27 DIAGNOSIS — E785 Hyperlipidemia, unspecified: Secondary | ICD-10-CM

## 2015-12-27 DIAGNOSIS — I10 Essential (primary) hypertension: Secondary | ICD-10-CM

## 2015-12-27 LAB — COMPLETE METABOLIC PANEL WITH GFR
ALT: 15 U/L (ref 6–29)
AST: 19 U/L (ref 10–35)
Albumin: 3.8 g/dL (ref 3.6–5.1)
Alkaline Phosphatase: 119 U/L (ref 33–130)
BILIRUBIN TOTAL: 0.8 mg/dL (ref 0.2–1.2)
BUN: 12 mg/dL (ref 7–25)
CALCIUM: 9.3 mg/dL (ref 8.6–10.4)
CHLORIDE: 100 mmol/L (ref 98–110)
CO2: 24 mmol/L (ref 20–31)
CREATININE: 0.66 mg/dL (ref 0.50–0.99)
Glucose, Bld: 83 mg/dL (ref 65–99)
Potassium: 3.7 mmol/L (ref 3.5–5.3)
Sodium: 137 mmol/L (ref 135–146)
TOTAL PROTEIN: 7.6 g/dL (ref 6.1–8.1)

## 2015-12-27 LAB — CBC WITH DIFFERENTIAL/PLATELET
BASOS ABS: 0 {cells}/uL (ref 0–200)
Basophils Relative: 0 %
EOS ABS: 152 {cells}/uL (ref 15–500)
Eosinophils Relative: 2 %
HEMATOCRIT: 38.2 % (ref 35.0–45.0)
Hemoglobin: 12.8 g/dL (ref 11.7–15.5)
LYMPHS PCT: 33 %
Lymphs Abs: 2508 cells/uL (ref 850–3900)
MCH: 29.9 pg (ref 27.0–33.0)
MCHC: 33.5 g/dL (ref 32.0–36.0)
MCV: 89.3 fL (ref 80.0–100.0)
MONO ABS: 532 {cells}/uL (ref 200–950)
MONOS PCT: 7 %
MPV: 10.8 fL (ref 7.5–12.5)
Neutro Abs: 4408 cells/uL (ref 1500–7800)
Neutrophils Relative %: 58 %
PLATELETS: 354 10*3/uL (ref 140–400)
RBC: 4.28 MIL/uL (ref 3.80–5.10)
RDW: 13.9 % (ref 11.0–15.0)
WBC: 7.6 10*3/uL (ref 3.8–10.8)

## 2015-12-27 LAB — LIPID PANEL
CHOL/HDL RATIO: 4.6 ratio (ref <=5.0)
CHOLESTEROL: 264 mg/dL — AB (ref 125–200)
HDL: 57 mg/dL (ref >=46)
LDL Cholesterol: 187 mg/dL — ABNORMAL HIGH (ref <130)
Triglycerides: 98 mg/dL (ref <150)
VLDL: 20 mg/dL (ref <30)

## 2015-12-27 LAB — TSH: TSH: 2.29 m[IU]/L

## 2015-12-28 ENCOUNTER — Encounter: Payer: Self-pay | Admitting: Internal Medicine

## 2015-12-28 ENCOUNTER — Ambulatory Visit (INDEPENDENT_AMBULATORY_CARE_PROVIDER_SITE_OTHER): Payer: BLUE CROSS/BLUE SHIELD | Admitting: Internal Medicine

## 2015-12-28 VITALS — BP 110/70 | HR 57 | Temp 97.2°F | Ht 62.0 in | Wt 186.2 lb

## 2015-12-28 DIAGNOSIS — R011 Cardiac murmur, unspecified: Secondary | ICD-10-CM

## 2015-12-28 DIAGNOSIS — E8881 Metabolic syndrome: Secondary | ICD-10-CM

## 2015-12-28 DIAGNOSIS — Z96653 Presence of artificial knee joint, bilateral: Secondary | ICD-10-CM | POA: Diagnosis not present

## 2015-12-28 DIAGNOSIS — I10 Essential (primary) hypertension: Secondary | ICD-10-CM

## 2015-12-28 DIAGNOSIS — E785 Hyperlipidemia, unspecified: Secondary | ICD-10-CM

## 2015-12-28 DIAGNOSIS — F329 Major depressive disorder, single episode, unspecified: Secondary | ICD-10-CM

## 2015-12-28 DIAGNOSIS — E559 Vitamin D deficiency, unspecified: Secondary | ICD-10-CM

## 2015-12-28 DIAGNOSIS — Z0000 Encounter for general adult medical examination without abnormal findings: Secondary | ICD-10-CM

## 2015-12-28 DIAGNOSIS — E669 Obesity, unspecified: Secondary | ICD-10-CM | POA: Diagnosis not present

## 2015-12-28 DIAGNOSIS — F418 Other specified anxiety disorders: Secondary | ICD-10-CM

## 2015-12-28 DIAGNOSIS — R7302 Impaired glucose tolerance (oral): Secondary | ICD-10-CM | POA: Diagnosis not present

## 2015-12-28 DIAGNOSIS — F419 Anxiety disorder, unspecified: Secondary | ICD-10-CM

## 2015-12-28 LAB — HEMOGLOBIN A1C
HEMOGLOBIN A1C: 5.2 % (ref <5.7)
MEAN PLASMA GLUCOSE: 103 mg/dL

## 2015-12-28 LAB — POCT URINALYSIS DIPSTICK
BILIRUBIN UA: NEGATIVE
Blood, UA: NEGATIVE
Glucose, UA: NEGATIVE
KETONES UA: NEGATIVE
LEUKOCYTES UA: NEGATIVE
Nitrite, UA: NEGATIVE
Protein, UA: NEGATIVE
Urobilinogen, UA: 0.2
pH, UA: 5

## 2015-12-28 LAB — MICROALBUMIN / CREATININE URINE RATIO
CREATININE, URINE: 226 mg/dL (ref 20–320)
MICROALB UR: 0.7 mg/dL
Microalb Creat Ratio: 3 mcg/mg creat (ref <30)

## 2015-12-28 LAB — VITAMIN D 25 HYDROXY (VIT D DEFICIENCY, FRACTURES): VIT D 25 HYDROXY: 28 ng/mL — AB (ref 30–100)

## 2015-12-28 MED ORDER — LISINOPRIL 10 MG PO TABS: 10.0000 mg | ORAL_TABLET | Freq: Every day | ORAL | 3 refills | Status: DC

## 2015-12-28 MED ORDER — TRIAMTERENE-HCTZ 37.5-25 MG PO TABS: 1.0000 | ORAL_TABLET | Freq: Every day | ORAL | 3 refills | Status: DC

## 2015-12-28 MED ORDER — ROSUVASTATIN CALCIUM 5 MG PO TABS: 5.0000 mg | ORAL_TABLET | Freq: Every day | ORAL | 1 refills | Status: DC

## 2015-12-28 NOTE — Progress Notes (Signed)
Subjective:    Patient ID: [name], female    DOB: October 18, 1950, 65 y.o.   MRN: [id number]  HPI   65 year old FemaleIn today for health maintenance exam and evaluation of medical issues including hypertension, obesity, hyperlipidemia, depression, impaired glucose tolerance. She has a history of anxiety.. She has metabolic syndrome, osteoarthritis and sleep apnea. History of innocent cardiac murmur.  No known drug allergies.  Had ganglion cyst removed from left hand 1989, bilateral tubal ligation 1983, right knee replacement February 2010, history of iron deficiency anemia in the past related to menorrhagia that resolved with menopause. Sleep apnea diagnosed in 2008, thrombosed hemorrhoid excised by Dr. Derrell LollingIngram in 2005.  Patient had allergy testing with Dr. [place] CallasSharma in 2007. This was negative. She also saw ENT physician at that time for chronic sinusitis and was thought to have GE reflux. CT of the sinuses was normal.  Hemoglobin A1c has been in the 6% range and his been treated with diet only.  A colonoscopy in 2011.  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 Husband is also retired. He formerly worked at [employer] as an Art gallery managerengineer. 2 adult daughters and 1 son. Son had kidney transplant. Patient does not smoke. Social alcohol consumption.  Family history: Father with history of hypertension died of renal failure and congestive heart failure. He also had a history of subdural hematoma and was 64 years old when he died. Mother died at age 64 of diabetes and hypertension. One brother with history of hypertension on dialysis. 2 sisters-one who is living and has history of colon cancer. Younger sister died in 1996 of a cerebral hemorrhage.  History bilateral knee replacement surgery in February and December 2010. Both surgeries done by Dr. Cleophas DunkerWhitfield.    Review of Systems as above     Objective:   Physical Exam  Constitutional: She is oriented to  person, place, and time. She appears well-developed and well-nourished. No distress.  HENT:  Head: Normocephalic and atraumatic.  Right Ear: External ear normal.  Left Ear: External ear normal.  Mouth/Throat: Oropharynx is clear and moist.  Eyes: Conjunctivae and EOM are normal. Pupils are equal, round, and reactive to light. Right eye exhibits no discharge. Left eye exhibits no discharge. No scleral icterus.  Neck: Neck supple. No JVD present. No thyromegaly present.  Cardiovascular: Normal rate, regular rhythm and intact distal pulses.   Murmur heard. 1/6 systolic ejection murmur unchanged  Pulmonary/Chest: Effort normal and breath sounds normal. No respiratory distress. She has no wheezes.  Breast normal female without masses  Abdominal: Bowel sounds are normal. She exhibits no distension. There is no tenderness. There is no rebound and no guarding.  Genitourinary:  Genitourinary Comments: Pap taken 2016. Bimanual normal.  Musculoskeletal: She exhibits no edema.  Lymphadenopathy:    She has no cervical adenopathy.  Neurological: She is alert and oriented to person, place, and time. No cranial nerve deficit. Coordination normal.  Skin: Skin is warm and dry. No rash noted. She is not diaphoretic. No erythema.  Psychiatric: She has a normal mood and affect. Her behavior is normal. Judgment and thought content normal.  Vitals reviewed.         Assessment & Plan:  History of bilateral knee replacement  Impaired glucose tolerance-hemoglobin A1c is excellent at 5.2% and previously 1 year ago was 5.7%  Essential hypertension  Hyperlipidemia patient has been on Crestor 5 mg daily previously. Obviously not taking it as total cholesterol was  264 and LDL cholesterol was 187.-  Obesity  Sleep apnea  Anxiety depression  Metabolic syndrome  History of systolic murmur-asymptomatic  Vitamin D deficiency-recommend 2000 units vitamin D 3 daily  Plan We started Crestor April 2016 but  obviously she is not taking it.. It has been reordered. Return December for lipid panel liver functions without office visit. Otherwise return in 6 months.

## 2016-01-19 NOTE — Patient Instructions (Addendum)
It was a pleasure to see you today. Continue same medications. Continue to work on diet exercise. Congratulations on going to the gym on a regular basis. Please take Crestor regularly

## 2016-01-24 NOTE — Progress Notes (Signed)
This encounter was created in error - please disregard.

## 2016-01-31 ENCOUNTER — Ambulatory Visit
Admission: RE | Admit: 2016-01-31 | Discharge: 2016-01-31 | Disposition: A | Discharge status: Home / Self Care | Payer: BLUE CROSS/BLUE SHIELD | Source: Ambulatory Visit | Attending: Internal Medicine | Admitting: Internal Medicine

## 2016-01-31 DIAGNOSIS — Z0000 Encounter for general adult medical examination without abnormal findings: Secondary | ICD-10-CM

## 2016-02-08 ENCOUNTER — Ambulatory Visit: Payer: BLUE CROSS/BLUE SHIELD | Admitting: Internal Medicine

## 2016-02-08 ENCOUNTER — Telehealth: Payer: Self-pay

## 2016-02-08 NOTE — Telephone Encounter (Signed)
Needs appt

## 2016-02-08 NOTE — Telephone Encounter (Signed)
Pt would like something for he shoulder she was doing yard work yesterday and she "agravated it" she's taking ibuprofen and it does not help with the pain.

## 2016-02-08 NOTE — Telephone Encounter (Signed)
LMTRC

## 2016-02-08 NOTE — Telephone Encounter (Signed)
It's now 4:27 p.m. On Friday, 02/08/16; patient has never returned the 2 phone calls from today where Aracelli left her messages.  Aracelli left messages that we would be happy to see patient today.  Had reserved appointment to see patient this afternoon at 4pm.  Never heard from patient today.

## 2016-02-08 NOTE — Telephone Encounter (Signed)
[name] left voicemail x 2 with patient that we would see her this afternoon at 4:00 for the shoulder pain.  Patient has not returned the call as of 2:35 p.m.

## 2016-04-03 ENCOUNTER — Other Ambulatory Visit: Payer: BLUE CROSS/BLUE SHIELD | Admitting: Internal Medicine

## 2016-04-03 DIAGNOSIS — I10 Essential (primary) hypertension: Secondary | ICD-10-CM

## 2016-04-03 DIAGNOSIS — E785 Hyperlipidemia, unspecified: Secondary | ICD-10-CM

## 2016-04-03 LAB — LIPID PANEL
CHOLESTEROL: 261 mg/dL — AB (ref <200)
HDL: 55 mg/dL (ref >50)
LDL CALC: 184 mg/dL — AB (ref <100)
TRIGLYCERIDES: 109 mg/dL (ref <150)
Total CHOL/HDL Ratio: 4.7 Ratio (ref <5.0)
VLDL: 22 mg/dL (ref <30)

## 2016-04-03 LAB — HEPATIC FUNCTION PANEL
ALK PHOS: 101 U/L (ref 33–130)
ALT: 15 U/L (ref 6–29)
AST: 18 U/L (ref 10–35)
Albumin: 3.8 g/dL (ref 3.6–5.1)
BILIRUBIN INDIRECT: 0.5 mg/dL (ref 0.2–1.2)
Bilirubin, Direct: 0.1 mg/dL (ref <=0.2)
TOTAL PROTEIN: 7.6 g/dL (ref 6.1–8.1)
Total Bilirubin: 0.6 mg/dL (ref 0.2–1.2)

## 2016-05-27 ENCOUNTER — Other Ambulatory Visit: Payer: Self-pay | Admitting: Internal Medicine

## 2016-06-02 ENCOUNTER — Ambulatory Visit: Payer: BLUE CROSS/BLUE SHIELD | Admitting: Internal Medicine

## 2016-06-03 ENCOUNTER — Ambulatory Visit (INDEPENDENT_AMBULATORY_CARE_PROVIDER_SITE_OTHER): Payer: BLUE CROSS/BLUE SHIELD | Admitting: Internal Medicine

## 2016-06-03 VITALS — BP 130/80

## 2016-06-03 DIAGNOSIS — F419 Anxiety disorder, unspecified: Secondary | ICD-10-CM

## 2016-06-03 DIAGNOSIS — F439 Reaction to severe stress, unspecified: Secondary | ICD-10-CM | POA: Diagnosis not present

## 2016-06-03 MED ORDER — ALPRAZOLAM 0.5 MG PO TABS: 0.5000 mg | ORAL_TABLET | Freq: Two times a day (BID) | ORAL | 3 refills | Status: DC | PRN

## 2016-06-03 MED ORDER — LISINOPRIL 10 MG PO TABS: 10.0000 mg | ORAL_TABLET | Freq: Every day | ORAL | 3 refills | Status: DC

## 2016-06-03 MED ORDER — SERTRALINE HCL 100 MG PO TABS: 100.0000 mg | ORAL_TABLET | Freq: Every day | ORAL | 1 refills | Status: DC

## 2016-06-03 MED ORDER — TRIAMTERENE-HCTZ 37.5-25 MG PO TABS: 1.0000 | ORAL_TABLET | Freq: Every day | ORAL | 3 refills | Status: DC

## 2016-06-03 NOTE — Progress Notes (Signed)
   Subjective:    Patient ID: Brianna SimmersBrenda T Fakhouri, female    DOB: 1950/07/27, 66 y.o.   MRN: [id number]  HPI 66 year old Black Female comes in today to discuss anxiety. She really doesn't have a lot of free time to herself. I think she thought that when she retired she would have some time to pursue some interest of her own but it seems her husband retired as well and he wants to go everywhere she goes. They go to the gym together. At home sometimes they are in separate rooms for hours at the time but they're basically together about all day. This is bothersome to her. She is reluctant discuss this with her husband.  Unfortunately despite going to the [employer] several times weekly she has gained weight. She is 191 1/2 pounds and in September 2017 she was 186 pounds and 4 ounces.  She has hypertension, obesity, hyperlipidemia, impaired glucose tolerance. She has metabolic syndrome osteoarthritis and sleep apnea. History of innocent cardiac murmur.  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 Husband formerly worked at [employer] as an Art gallery managerengineer. 2 adult daughters and 1 son. She is expecting a grandchild in the near future.    Review of Systems See above-patient finds her self getting frustrated with lack of free time.    Objective:   Physical Exam  Spent 20 minutes speaking with her about these issues with retirement of both spouses and the need to create free time for oneself. She may benefit from counseling.      Assessment & Plan:  Anxiety-surrounding situational stress with retirement of both spouses  Plan: At her request refill Xanax. She is also on Zoloft 100 mg daily. She will take exam neck 0.5 mg twice daily as needed. Recommended counselor.

## 2016-06-18 ENCOUNTER — Encounter: Payer: Self-pay | Admitting: Internal Medicine

## 2016-06-18 NOTE — Patient Instructions (Addendum)
Continue Zoloft daily. Xanax refill. Consider counseling. Has appointment for six-month recheck here in March.

## 2016-07-01 ENCOUNTER — Other Ambulatory Visit: Payer: BLUE CROSS/BLUE SHIELD | Admitting: Internal Medicine

## 2016-07-01 DIAGNOSIS — Z79899 Other long term (current) drug therapy: Secondary | ICD-10-CM

## 2016-07-01 DIAGNOSIS — E785 Hyperlipidemia, unspecified: Secondary | ICD-10-CM

## 2016-07-01 DIAGNOSIS — I10 Essential (primary) hypertension: Secondary | ICD-10-CM

## 2016-07-01 DIAGNOSIS — R7302 Impaired glucose tolerance (oral): Secondary | ICD-10-CM

## 2016-07-01 LAB — LIPID PANEL
CHOL/HDL RATIO: 5.6 ratio — AB (ref <5.0)
Cholesterol: 275 mg/dL — ABNORMAL HIGH (ref <200)
HDL: 49 mg/dL — AB (ref >50)
LDL CALC: 206 mg/dL — AB (ref <100)
Triglycerides: 98 mg/dL (ref <150)
VLDL: 20 mg/dL (ref <30)

## 2016-07-01 LAB — HEPATIC FUNCTION PANEL
ALBUMIN: 4 g/dL (ref 3.6–5.1)
ALT: 17 U/L (ref 6–29)
AST: 18 U/L (ref 10–35)
Alkaline Phosphatase: 103 U/L (ref 33–130)
BILIRUBIN DIRECT: 0.1 mg/dL (ref <=0.2)
BILIRUBIN TOTAL: 0.7 mg/dL (ref 0.2–1.2)
Indirect Bilirubin: 0.6 mg/dL (ref 0.2–1.2)
Total Protein: 7.8 g/dL (ref 6.1–8.1)

## 2016-07-02 LAB — HEMOGLOBIN A1C
HEMOGLOBIN A1C: 5.2 % (ref <5.7)
MEAN PLASMA GLUCOSE: 103 mg/dL

## 2016-07-03 ENCOUNTER — Ambulatory Visit (INDEPENDENT_AMBULATORY_CARE_PROVIDER_SITE_OTHER): Payer: BLUE CROSS/BLUE SHIELD | Admitting: Internal Medicine

## 2016-07-03 ENCOUNTER — Encounter: Payer: Self-pay | Admitting: Internal Medicine

## 2016-07-03 VITALS — BP 110/70 | HR 62 | Temp 97.1°F | Ht 62.0 in | Wt 192.0 lb

## 2016-07-03 DIAGNOSIS — I10 Essential (primary) hypertension: Secondary | ICD-10-CM | POA: Diagnosis not present

## 2016-07-03 DIAGNOSIS — E784 Other hyperlipidemia: Secondary | ICD-10-CM | POA: Diagnosis not present

## 2016-07-03 DIAGNOSIS — F418 Other specified anxiety disorders: Secondary | ICD-10-CM | POA: Diagnosis not present

## 2016-07-03 DIAGNOSIS — E8881 Metabolic syndrome: Secondary | ICD-10-CM | POA: Diagnosis not present

## 2016-07-03 DIAGNOSIS — R7302 Impaired glucose tolerance (oral): Secondary | ICD-10-CM | POA: Diagnosis not present

## 2016-07-03 DIAGNOSIS — Z6835 Body mass index (BMI) 35.0-35.9, adult: Secondary | ICD-10-CM

## 2016-07-03 DIAGNOSIS — F419 Anxiety disorder, unspecified: Secondary | ICD-10-CM

## 2016-07-03 DIAGNOSIS — E7849 Other hyperlipidemia: Secondary | ICD-10-CM

## 2016-07-03 DIAGNOSIS — F329 Major depressive disorder, single episode, unspecified: Secondary | ICD-10-CM

## 2016-07-03 MED ORDER — AZITHROMYCIN 250 MG PO TABS: ORAL_TABLET | ORAL | 0 refills | Status: DC

## 2016-07-03 NOTE — Progress Notes (Signed)
   Subjective:    Patient ID: Laneta SimmersBrenda T Bleich, female    DOB: [birth year], 66 y.o.   MRN: [id number]  HPI  For 6 month follow up. Going to [place] in May with her daughter.  Unfortunately has not been taking Crestor as directed.  History of essential hypertension, obesity, hyperlipidemia, anxiety depression, and impaired glucose tolerance.  Continues to go to gym several times weekly. Despite this, has not really lost weight.    Review of Systems see above     Objective:   Physical Exam  Chest clear to auscultation without rales or wheezing. Cardiac exam regular rate and rhythm normal S1 and S2. Neck supple without thyromegaly or carotid bruits. Extremities without edema.      Assessment & Plan:  Hyperlipidemia-has not been compliant with Crestor. Total cholesterol 275 with an LDL cholesterol 206 off Crestor  Impaired glucose tolerance-Hemoglobin A1c stable at 5.2%  Essential hypertension-stable at 110/70 today  Anxiety depression-stable with when necessary Xanax  Plan: Prescribe Zithromax Z-PAK for her to take a trip to [place] if needed. Advised patient to take Crestor 5 mg daily on a regular basis and return June 1 for lipid panel liver functions and office visit.

## 2016-07-14 NOTE — Patient Instructions (Addendum)
Continue diet exercise and weight loss efforts. Please take Crestor on a regular basis. Return June 1 for lipid panel liver functions and office visit.

## 2016-12-04 ENCOUNTER — Other Ambulatory Visit: Payer: Self-pay | Admitting: Internal Medicine

## 2016-12-04 NOTE — Telephone Encounter (Signed)
LMTCB

## 2016-12-04 NOTE — Telephone Encounter (Signed)
Due for CPE Sept. No appt in [facility] please call before refilling

## 2016-12-15 ENCOUNTER — Telehealth (INDEPENDENT_AMBULATORY_CARE_PROVIDER_SITE_OTHER): Payer: Self-pay | Admitting: Orthopaedic Surgery

## 2016-12-15 ENCOUNTER — Other Ambulatory Visit (INDEPENDENT_AMBULATORY_CARE_PROVIDER_SITE_OTHER): Payer: Self-pay

## 2016-12-15 MED ORDER — AMOXICILLIN 500 MG PO TABS: ORAL_TABLET | ORAL | 0 refills | Status: DC

## 2016-12-15 NOTE — Telephone Encounter (Signed)
Patient is having dental work tomorrow, and needs an antibotic called in. Patient uses [facility] in [place] [phone]. Call patient when rx sent in please, as she needs it tomorrow.

## 2016-12-15 NOTE — Telephone Encounter (Signed)
done

## 2017-01-26 ENCOUNTER — Other Ambulatory Visit: Payer: Medicare Other | Admitting: Internal Medicine

## 2017-01-26 DIAGNOSIS — M1990 Unspecified osteoarthritis, unspecified site: Secondary | ICD-10-CM

## 2017-01-26 DIAGNOSIS — F419 Anxiety disorder, unspecified: Secondary | ICD-10-CM

## 2017-01-26 DIAGNOSIS — I10 Essential (primary) hypertension: Secondary | ICD-10-CM

## 2017-01-26 DIAGNOSIS — Z0000 Encounter for general adult medical examination without abnormal findings: Secondary | ICD-10-CM

## 2017-01-26 DIAGNOSIS — F329 Major depressive disorder, single episode, unspecified: Secondary | ICD-10-CM | POA: Diagnosis not present

## 2017-01-26 DIAGNOSIS — Z862 Personal history of diseases of the blood and blood-forming organs and certain disorders involving the immune mechanism: Secondary | ICD-10-CM

## 2017-01-26 DIAGNOSIS — R7302 Impaired glucose tolerance (oral): Secondary | ICD-10-CM | POA: Diagnosis not present

## 2017-01-26 DIAGNOSIS — E785 Hyperlipidemia, unspecified: Secondary | ICD-10-CM | POA: Diagnosis not present

## 2017-01-26 DIAGNOSIS — E2839 Other primary ovarian failure: Secondary | ICD-10-CM | POA: Diagnosis not present

## 2017-01-26 DIAGNOSIS — F32A Depression, unspecified: Secondary | ICD-10-CM

## 2017-01-26 DIAGNOSIS — E8881 Metabolic syndrome: Secondary | ICD-10-CM

## 2017-01-27 LAB — COMPLETE METABOLIC PANEL WITH GFR
AG Ratio: 1.1 (calc) (ref 1.0–2.5)
ALT: 15 U/L (ref 6–29)
AST: 16 U/L (ref 10–35)
Albumin: 4 g/dL (ref 3.6–5.1)
Alkaline phosphatase (APISO): 111 U/L (ref 33–130)
BUN: 17 mg/dL (ref 7–25)
CALCIUM: 9.6 mg/dL (ref 8.6–10.4)
CO2: 27 mmol/L (ref 20–32)
Chloride: 104 mmol/L (ref 98–110)
Creat: 0.67 mg/dL (ref 0.50–0.99)
GFR, EST AFRICAN AMERICAN: 107 mL/min/{1.73_m2} (ref >=60)
GFR, EST NON AFRICAN AMERICAN: 92 mL/min/{1.73_m2} (ref >=60)
GLUCOSE: 92 mg/dL (ref 65–99)
Globulin: 3.7 g/dL (calc) (ref 1.9–3.7)
Potassium: 3.9 mmol/L (ref 3.5–5.3)
Sodium: 140 mmol/L (ref 135–146)
TOTAL PROTEIN: 7.7 g/dL (ref 6.1–8.1)
Total Bilirubin: 0.7 mg/dL (ref 0.2–1.2)

## 2017-01-27 LAB — CBC WITH DIFFERENTIAL/PLATELET
BASOS PCT: 0.4 %
Basophils Absolute: 31 cells/uL (ref 0–200)
EOS PCT: 2.5 %
Eosinophils Absolute: 193 cells/uL (ref 15–500)
HCT: 39 % (ref 35.0–45.0)
Hemoglobin: 13.1 g/dL (ref 11.7–15.5)
Lymphs Abs: 2587 cells/uL (ref 850–3900)
MCH: 29.6 pg (ref 27.0–33.0)
MCHC: 33.6 g/dL (ref 32.0–36.0)
MCV: 88.2 fL (ref 80.0–100.0)
MONOS PCT: 7.6 %
MPV: 11.1 fL (ref 7.5–12.5)
Neutro Abs: 4304 cells/uL (ref 1500–7800)
Neutrophils Relative %: 55.9 %
PLATELETS: 318 10*3/uL (ref 140–400)
RBC: 4.42 10*6/uL (ref 3.80–5.10)
RDW: 13.1 % (ref 11.0–15.0)
Total Lymphocyte: 33.6 %
WBC: 7.7 10*3/uL (ref 3.8–10.8)
WBCMIX: 585 {cells}/uL (ref 200–950)

## 2017-01-27 LAB — TSH: TSH: 2.55 m[IU]/L (ref 0.40–4.50)

## 2017-01-27 LAB — HEMOGLOBIN A1C
HEMOGLOBIN A1C: 5.4 %{Hb} (ref <5.7)
Mean Plasma Glucose: 108 (calc)
eAG (mmol/L): 6 (calc)

## 2017-01-27 LAB — LIPID PANEL
CHOL/HDL RATIO: 5.4 (calc) — AB (ref <5.0)
Cholesterol: 294 mg/dL — ABNORMAL HIGH (ref <200)
HDL: 54 mg/dL (ref >50)
LDL CHOLESTEROL (CALC): 214 mg/dL — AB
NON-HDL CHOLESTEROL (CALC): 240 mg/dL — AB (ref <130)
Triglycerides: 118 mg/dL (ref <150)

## 2017-01-27 LAB — VITAMIN D 25 HYDROXY (VIT D DEFICIENCY, FRACTURES): VIT D 25 HYDROXY: 29 ng/mL — AB (ref 30–100)

## 2017-01-27 LAB — MICROALBUMIN / CREATININE URINE RATIO: Creatinine, Urine: 122 mg/dL (ref 20–275)

## 2017-01-30 ENCOUNTER — Encounter: Payer: Self-pay | Admitting: Internal Medicine

## 2017-01-30 ENCOUNTER — Ambulatory Visit (INDEPENDENT_AMBULATORY_CARE_PROVIDER_SITE_OTHER): Payer: Medicare Other | Admitting: Internal Medicine

## 2017-01-30 ENCOUNTER — Other Ambulatory Visit (HOSPITAL_COMMUNITY)
Admission: RE | Admit: 2017-01-30 | Discharge: 2017-01-30 | Disposition: A | Discharge status: Home / Self Care | Payer: Medicare Other | Source: Ambulatory Visit | Attending: Internal Medicine | Admitting: Internal Medicine

## 2017-01-30 VITALS — BP 140/78 | HR 53 | Temp 97.6°F | Ht 62.0 in | Wt 193.0 lb

## 2017-01-30 DIAGNOSIS — F329 Major depressive disorder, single episode, unspecified: Secondary | ICD-10-CM | POA: Diagnosis not present

## 2017-01-30 DIAGNOSIS — R011 Cardiac murmur, unspecified: Secondary | ICD-10-CM | POA: Diagnosis not present

## 2017-01-30 DIAGNOSIS — E559 Vitamin D deficiency, unspecified: Secondary | ICD-10-CM

## 2017-01-30 DIAGNOSIS — Z6835 Body mass index (BMI) 35.0-35.9, adult: Secondary | ICD-10-CM | POA: Diagnosis not present

## 2017-01-30 DIAGNOSIS — R7302 Impaired glucose tolerance (oral): Secondary | ICD-10-CM | POA: Diagnosis not present

## 2017-01-30 DIAGNOSIS — Z0000 Encounter for general adult medical examination without abnormal findings: Secondary | ICD-10-CM | POA: Insufficient documentation

## 2017-01-30 DIAGNOSIS — Z23 Encounter for immunization: Secondary | ICD-10-CM

## 2017-01-30 DIAGNOSIS — F419 Anxiety disorder, unspecified: Secondary | ICD-10-CM

## 2017-01-30 DIAGNOSIS — E2839 Other primary ovarian failure: Secondary | ICD-10-CM | POA: Diagnosis not present

## 2017-01-30 DIAGNOSIS — I10 Essential (primary) hypertension: Secondary | ICD-10-CM

## 2017-01-30 DIAGNOSIS — E7849 Other hyperlipidemia: Secondary | ICD-10-CM | POA: Diagnosis not present

## 2017-01-30 LAB — POCT URINALYSIS DIPSTICK
BILIRUBIN UA: NEGATIVE
Blood, UA: NEGATIVE
GLUCOSE UA: NEGATIVE
Ketones, UA: NEGATIVE
LEUKOCYTES UA: NEGATIVE
NITRITE UA: NEGATIVE
Protein, UA: NEGATIVE
Spec Grav, UA: 1.015 (ref 1.010–1.025)
Urobilinogen, UA: 0.2 E.U./dL
pH, UA: 6.5 (ref 5.0–8.0)

## 2017-01-30 MED ORDER — ROSUVASTATIN CALCIUM 5 MG PO TABS
5.0000 mg | ORAL_TABLET | Freq: Every day | ORAL | 3 refills | Status: DC
Start: 2017-01-30 — End: 2017-05-04

## 2017-02-04 LAB — CYTOLOGY - PAP: DIAGNOSIS: NEGATIVE

## 2017-02-18 ENCOUNTER — Encounter: Payer: Self-pay | Admitting: Internal Medicine

## 2017-02-18 NOTE — Patient Instructions (Signed)
Patient needs to be compliant with her medication.  She needs to continue to work on her diet.  She may need to increase her exercise regimen.  In November and follow-up in January with lipid management.  Needs annual mammogram.  Prevnar 13 given today.  EKG within normal limits.

## 2017-02-18 NOTE — Progress Notes (Signed)
Subjective:    Patient ID: Brianna SimmersBrenda T Heggs, female    DOB: 11-12-1950, 66 y.o.   MRN: [id number]  HPI 66 year old Black Female in today for health maintenance exam and Welcome to Medicare physical examination.  She has a history of hypertension, obesity, hyperlipidemia, depression, impaired glucose tolerance.  She has a history of anxiety and metabolic syndrome.  History of osteoarthritis and sleep apnea.  History of illness or cardiac murmur.  No known drug allergies.  Had a ganglion cyst removed from left hand 1999, bilateral tubal ligation 1983, right knee replacement February 2010, history of iron deficiency anemia in the past related to menorrhagia that resolved with menopause.  Sleep apnea diagnosed in 2008, thrombosed hemorrhoid excised by Dr. Derrell LollingIngram in 2005.  Patient had allergy testing by Dr. Colby CallasSharma in 2007.  This was negative.  She also saw ENT physician at that time for chronic sinusitis and was thought to have GE reflux.  CT of the sinuses was normal.  Hemoglobin A1c has been in the 6% range and has been treated with diet only.  Had colonoscopy in 2011.  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  Husband is also retired and he formerly worked at [employer] as an Art gallery managerengineer.  They go to the gym together.  2 adult daughters and 1 son.  Son had kidney transplant.  One grandchild.  Patient does not smoke.  Social alcohol consumption.  Family history: Father with history of hypertension died of renal failure and congestive heart failure.  He also has a history of subdural hematoma and was 65 years old when he died.  Mother died at age 65 of diabetes and hypertension.  One brother with history of hypertension on dialysis.  2 sisters-one who is living with history of colon cancer.  Younger sister died in 1996 of a cerebral hemorrhage.  History of bilateral knee replacement surgery in February in December 2010.  Both surgeries were done by Dr.  Cleophas DunkerWhitfield.    Review of Systems  Constitutional: Positive for fatigue.  Respiratory: Negative.   Cardiovascular: Negative.   Gastrointestinal: Negative.   Genitourinary: Negative.   Neurological: Negative.   Psychiatric/Behavioral:       Anxiety       Objective:   Physical Exam  Constitutional: She is oriented to person, place, and time. She appears well-developed and well-nourished. No distress.  HENT:  Head: Normocephalic and atraumatic.  Right Ear: External ear normal.  Left Ear: External ear normal.  Mouth/Throat: Oropharynx is clear and moist.  Eyes: Pupils are equal, round, and reactive to light. Conjunctivae and EOM are normal. Right eye exhibits no discharge. No scleral icterus.  Neck: Normal range of motion. Neck supple. No JVD present. No thyromegaly present.  Cardiovascular: Normal rate, regular rhythm and intact distal pulses.   Murmur heard. 1/6 systolic ejection murmur  Pulmonary/Chest: Breath sounds normal. No respiratory distress. She has no wheezes. She has no rales.  Breasts normal female without masses  Abdominal: Soft. Bowel sounds are normal. She exhibits no distension and no mass. There is no tenderness. There is no rebound.  Genitourinary:  Genitourinary Comments: Pap taken today and will not be repeated due to age.  Bimanual normal.  Musculoskeletal: She exhibits no edema.  Lymphadenopathy:    She has no cervical adenopathy.  Neurological: She is alert and oriented to person, place, and time. She has normal reflexes. No cranial nerve deficit. Coordination normal.  Skin: Skin is warm and dry. No  rash noted. She is not diaphoretic.  Psychiatric: She has a normal mood and affect. Her behavior is normal. Judgment and thought content normal.          Assessment & Plan:  Normal health maintenance exam  History bilateral knee replacement  Impaired glucose tolerance stable at 5.4%  Essential hypertension- elevated at 140/78 return in  November.  Hyperlipidemia-I am disappointed in her lipid control.  Total cholesterol has gone from 275-294.  LDL cholesterol is 214.  Triglycerides are normal at 118.  With her family history is important that she take cholesterol-lowering medication she must restart Crestor 5 mg daily  Obesity  Sleep apnea  Anxiety depression-no longer taking Zoloft  Metabolic syndrome  History of systolic murmur asymptomatic  History of vitamin D deficiency vitamin D level is 29.  Recommend 2000 units vitamin D3 daily  Return in January for follow-up of hyperlipidemia.  Prevnar 13 given today.  EKG obtained within normal limits.  Order placed for mammogram.  Take vitamin D 2000 units daily.  Return in November for follow-up on hypertension.  Blood pressure is elevated today at 140 over 78.  Make sure she is taking antihypertensive medication  Subjective:   Patient presents for Medicare Annual/Subsequent preventive examination.  Review Past Medical/Family/Social: See above   Risk Factors  Current exercise habits: Goes to gym with her husband Dietary issues discussed: Low-fat low-carb  Cardiac risk factors: History of hypertension and hyperlipidemia  in father who died of congestive heart failure.    Patient is overweight and has hyperlipidemia as well as hypertension.  Depression Screen  (Note: if answer to either of the following is "Yes", a more complete depression screening is indicated)   Over the past two weeks, have you felt down, depressed or hopeless? No  Over the past two weeks, have you felt little interest or pleasure in doing things?  Sometimes Have you lost interest or pleasure in daily life? No Do you often feel hopeless? No Do you cry easily over simple problems?      yes but not depressed   Activities of Daily Living  In your present state of health, do you have any difficulty performing the following activities?:   Driving? No  Managing money? No  Feeding yourself? No   Getting from bed to chair? No  Climbing a flight of stairs? No  Preparing food and eating?: No  Bathing or showering? No  Getting dressed: No  Getting to the toilet? No  Using the toilet:No  Moving around from place to place: No  In the past year have you fallen or had a near fall?:No  Are you sexually active?  Yes Do you have more than one partner? No   Hearing Difficulties: No  Do you often ask people to speak up or repeat themselves? No  Do you experience ringing or noises in your ears?  Yes Do you have difficulty understanding soft or whispered voices? No  Do you feel that you have a problem with memory?  He has some time Do you often misplace items? No    Home Safety:  Do you have a smoke alarm at your residence? Yes Do you have grab bars in the bathroom?  No Do you have throw rugs in your house?  Yes   Cognitive Testing  Alert? Yes Normal Appearance?Yes  Oriented to person? Yes Place? Yes  Time? Yes  Recall of three objects? Yes  Can perform simple calculations? Yes  Displays appropriate judgment?Yes  Can read the correct time from a watch face?Yes   List the Names of Other Physician/Practitioners you currently use:  See referral list for the physicians patient is currently seeing.     Review of Systems: See above   Objective:     General appearance: Appears stated age and obese  Head: Normocephalic, without obvious abnormality, atraumatic  Eyes: conj clear, EOMi PEERLA  Ears: normal TM's and external ear canals both ears  Nose: Nares normal. Septum midline. Mucosa normal. No drainage or sinus tenderness.  Throat: lips, mucosa, and tongue normal; teeth and gums normal  Neck: no adenopathy, no carotid bruit, no JVD, supple, symmetrical, trachea midline and thyroid not enlarged, symmetric, no tenderness/mass/nodules  No CVA tenderness.  Lungs: clear to auscultation bilaterally  Breasts: normal appearance, no masses or tenderness, top of the pacemaker on  left upper chest. Incision well-healed. It is tender.  Heart: regular rate and rhythm, S1, S2 normal ,1/6 systolic ejection murmur unchanged Abdomen: soft, non-tender; bowel sounds normal; no masses, no organomegaly  Musculoskeletal: ROM normal in all joints, no crepitus, no deformity, Normal muscle strengthen. Back  is symmetric, no curvature. Skin: Skin color, texture, turgor normal. No rashes or lesions  Lymph nodes: Cervical, supraclavicular, and axillary nodes normal.  Neurologic: CN 2 -12 Normal, Normal symmetric reflexes. Normal coordination and gait  Psych: Alert & Oriented x 3, Mood appear stable.    Assessment:    Annual wellness medicare exam   Plan:    During the course of the visit the patient was educated and counseled about appropriate screening and preventive services including:  Annual mammogram  Prevnar 13 given      Patient Instructions (the written plan) was given to the patient.  Medicare Attestation  I have personally reviewed:  The patient's medical and social history  Their use of alcohol, tobacco or illicit drugs  Their current medications and supplements  The patient's functional ability including ADLs,fall risks, home safety risks, cognitive, and hearing and visual impairment  Diet and physical activities  Evidence for depression or mood disorders  The patient's weight, height, BMI, and visual acuity have been recorded in the chart. I have made referrals, counseling, and provided education to the patient based on review of the above and I have provided the patient with a written personalized care plan for preventive services.

## 2017-02-20 ENCOUNTER — Ambulatory Visit (INDEPENDENT_AMBULATORY_CARE_PROVIDER_SITE_OTHER): Payer: Medicare Other | Admitting: Internal Medicine

## 2017-02-20 ENCOUNTER — Encounter: Payer: Self-pay | Admitting: Internal Medicine

## 2017-02-20 VITALS — BP 116/60 | HR 56 | Temp 97.8°F

## 2017-02-20 DIAGNOSIS — I10 Essential (primary) hypertension: Secondary | ICD-10-CM

## 2017-02-20 NOTE — Patient Instructions (Addendum)
Blood pressure check all is normal. Take meds as directed. RTC 6 months

## 2017-02-20 NOTE — Progress Notes (Signed)
Nurse visit blood pressure check all is normal. BP 166/80. No change in meds RTC 6 months. Take meds as directed.

## 2017-03-25 ENCOUNTER — Telehealth: Payer: Self-pay | Admitting: Internal Medicine

## 2017-03-25 NOTE — Telephone Encounter (Signed)
Patient called to request refill on her Xanax 0.5 mg.    Verbal order from Dr. Lenord FellersBaxley ok to refill Xanax 0.5mg  #60 with 5 refills.  Called to [facility] @ [place] @ [phone].

## 2017-04-20 ENCOUNTER — Other Ambulatory Visit: Payer: Self-pay | Admitting: Internal Medicine

## 2017-04-20 DIAGNOSIS — E785 Hyperlipidemia, unspecified: Secondary | ICD-10-CM

## 2017-04-20 DIAGNOSIS — Z79899 Other long term (current) drug therapy: Secondary | ICD-10-CM

## 2017-05-01 ENCOUNTER — Other Ambulatory Visit: Payer: Medicare Other | Admitting: Internal Medicine

## 2017-05-01 DIAGNOSIS — E785 Hyperlipidemia, unspecified: Secondary | ICD-10-CM

## 2017-05-01 DIAGNOSIS — Z79899 Other long term (current) drug therapy: Secondary | ICD-10-CM | POA: Diagnosis not present

## 2017-05-01 LAB — LIPID PANEL
CHOLESTEROL: 280 mg/dL — AB (ref <200)
HDL: 56 mg/dL (ref >50)
LDL Cholesterol (Calc): 197 mg/dL (calc) — ABNORMAL HIGH
Non-HDL Cholesterol (Calc): 224 mg/dL (calc) — ABNORMAL HIGH (ref <130)
Total CHOL/HDL Ratio: 5 (calc) — ABNORMAL HIGH (ref <5.0)
Triglycerides: 127 mg/dL (ref <150)

## 2017-05-01 LAB — HEPATIC FUNCTION PANEL
AG RATIO: 1.1 (calc) (ref 1.0–2.5)
ALBUMIN MSPROF: 3.9 g/dL (ref 3.6–5.1)
ALT: 18 U/L (ref 6–29)
AST: 16 U/L (ref 10–35)
Alkaline phosphatase (APISO): 110 U/L (ref 33–130)
BILIRUBIN TOTAL: 0.5 mg/dL (ref 0.2–1.2)
Bilirubin, Direct: 0.1 mg/dL (ref 0.0–0.2)
GLOBULIN: 3.5 g/dL (ref 1.9–3.7)
Indirect Bilirubin: 0.4 mg/dL (calc) (ref 0.2–1.2)
Total Protein: 7.4 g/dL (ref 6.1–8.1)

## 2017-05-04 ENCOUNTER — Encounter: Payer: Self-pay | Admitting: Internal Medicine

## 2017-05-04 ENCOUNTER — Ambulatory Visit (INDEPENDENT_AMBULATORY_CARE_PROVIDER_SITE_OTHER): Payer: Medicare Other | Admitting: Internal Medicine

## 2017-05-04 VITALS — BP 150/80 | HR 74 | Temp 97.9°F | Ht 62.0 in | Wt 199.0 lb

## 2017-05-04 DIAGNOSIS — Z6836 Body mass index (BMI) 36.0-36.9, adult: Secondary | ICD-10-CM

## 2017-05-04 DIAGNOSIS — E7849 Other hyperlipidemia: Secondary | ICD-10-CM | POA: Diagnosis not present

## 2017-05-04 DIAGNOSIS — I10 Essential (primary) hypertension: Secondary | ICD-10-CM | POA: Diagnosis not present

## 2017-05-04 MED ORDER — ROSUVASTATIN CALCIUM 5 MG PO TABS: 5.0000 mg | ORAL_TABLET | Freq: Every day | ORAL | 3 refills | Status: DC

## 2017-05-04 NOTE — Progress Notes (Signed)
   Subjective:    Patient ID: [name], female    DOB: 11-Sep-1950, 67 y.o.   MRN: [id number]  HPI She is here today to follow-up on hyperlipidemia and hypertension.  During the holidays she did not diet and exercise as much as she usually does.  Generally goes to the gym with her husband several days a week but they were traveling to see family.  Blood pressure is elevated at 150/80.  BMI is 36.40.  She was here in October and total cholesterol was 294 with LDL of 214, normal triglycerides and HDL of 54.  She really did not want to try statin medication.  She wanted to work on it on her own.  We repeated her lipid panel recently and total cholesterol was 280 with normal triglycerides and HDL of 56 with LDL of 197.  She really needs to be on statin medication.    Review of Systems see above     Objective:   Physical Exam Chest clear.  Cardiac exam regular rate and rhythm.  Extremities without edema.       Assessment & Plan:  Discussion about lifestyle and risk factors for heart disease including importance of diet exercise and weight loss  Plan: She will start Crestor 5 mg daily and will follow up in 3 months.  She is on lisinopril 10 mg daily.  She needs to take this early in the morning before coming to the office.  BMI 36.40-needs to be serious about diet exercise and weight loss.

## 2017-05-08 ENCOUNTER — Ambulatory Visit: Payer: Medicare Other

## 2017-05-08 ENCOUNTER — Inpatient Hospital Stay: Admission: RE | Admit: 2017-05-08 | Payer: Medicare Other | Source: Ambulatory Visit

## 2017-05-13 ENCOUNTER — Ambulatory Visit: Payer: Medicare Other | Admitting: Internal Medicine

## 2017-05-20 ENCOUNTER — Encounter: Payer: Self-pay | Admitting: Internal Medicine

## 2017-05-20 NOTE — Patient Instructions (Signed)
Please take antihypertensive medication first thing in the morning upon arising.  Start Crestor 5 mg daily.  Follow-up in 3 months.  Please work on diet exercise and weight loss.

## 2017-05-25 ENCOUNTER — Other Ambulatory Visit: Payer: Self-pay | Admitting: Internal Medicine

## 2017-05-27 ENCOUNTER — Telehealth: Payer: Self-pay

## 2017-05-27 NOTE — Telephone Encounter (Signed)
Called following up from her last visit need to schedule fasting labs in 3 months and a BP check visit. Patient to call back to schedule.

## 2017-06-08 ENCOUNTER — Other Ambulatory Visit: Payer: Self-pay | Admitting: Internal Medicine

## 2017-07-03 ENCOUNTER — Other Ambulatory Visit: Payer: Self-pay | Admitting: Internal Medicine

## 2017-10-13 ENCOUNTER — Other Ambulatory Visit: Payer: Self-pay | Admitting: Internal Medicine

## 2017-10-13 NOTE — Telephone Encounter (Signed)
Pt was last here in January and was supposed to be on Crestor and follow up in 3 months which wouyld have been April. Is she taking Crestor? Needs follow up appt July with labs

## 2017-10-14 ENCOUNTER — Other Ambulatory Visit: Payer: Self-pay | Admitting: Internal Medicine

## 2017-10-14 NOTE — Telephone Encounter (Signed)
Refill Xanax x 90 days 

## 2017-10-14 NOTE — Telephone Encounter (Signed)
You filled this today. Not sure why we are getting second request

## 2017-10-14 NOTE — Telephone Encounter (Signed)
Spoke with patient she is not taking crestor bc she heard so many "bad things about taking a statin she said she has relatives that are on a statin and they complain of pain. She said she is watching her diet and exercises four times a week she does "cardio". Scheduled a lab and a follow up in July.

## 2017-10-15 ENCOUNTER — Other Ambulatory Visit: Payer: Self-pay | Admitting: Internal Medicine

## 2017-10-15 DIAGNOSIS — E785 Hyperlipidemia, unspecified: Secondary | ICD-10-CM

## 2017-10-26 ENCOUNTER — Other Ambulatory Visit: Payer: Medicare Other | Admitting: Internal Medicine

## 2017-10-26 DIAGNOSIS — E785 Hyperlipidemia, unspecified: Secondary | ICD-10-CM | POA: Diagnosis not present

## 2017-10-26 LAB — LIPID PANEL
CHOL/HDL RATIO: 4.8 (calc) (ref <5.0)
Cholesterol: 257 mg/dL — ABNORMAL HIGH (ref <200)
HDL: 53 mg/dL (ref >50)
LDL CHOLESTEROL (CALC): 176 mg/dL — AB
NON-HDL CHOLESTEROL (CALC): 204 mg/dL — AB (ref <130)
TRIGLYCERIDES: 140 mg/dL (ref <150)

## 2017-10-27 ENCOUNTER — Ambulatory Visit (INDEPENDENT_AMBULATORY_CARE_PROVIDER_SITE_OTHER): Payer: Medicare Other | Admitting: Internal Medicine

## 2017-10-27 VITALS — BP 102/66 | HR 67 | Ht 62.0 in | Wt 199.0 lb

## 2017-10-27 DIAGNOSIS — E7800 Pure hypercholesterolemia, unspecified: Secondary | ICD-10-CM

## 2017-10-27 DIAGNOSIS — I10 Essential (primary) hypertension: Secondary | ICD-10-CM | POA: Diagnosis not present

## 2017-10-27 MED ORDER — ROSUVASTATIN CALCIUM 10 MG PO TABS: 10.0000 mg | ORAL_TABLET | Freq: Every day | ORAL | 3 refills | Status: DC

## 2017-10-27 NOTE — Progress Notes (Signed)
   Subjective:    Patient ID: [name], female    DOB: [birth year], 67 y.o.   MRN: [id number]  HPI 67 year old Black Female for follow up on lipid panel and hypertension.  In January total cholesterol was 280 with an LDL cholesterol of 197.  She was started on Crestor 5 mg daily.  Total cholesterol has decreased from 2 80-2 57 and LDL has decreased from 1 97-1 76.  Triglycerides remain normal.  We are going to increase Crestor to 10 mg daily and follow-up in October at time of physical exam.    Review of Systems no new complaints     Objective:   Physical Exam Blood pressure stable at 102/66.  Chest clear.  Cardiac exam regular rate and rhythm.  Extremities without edema       Assessment & Plan:  Essential hypertension-stable on current regimen  Hyperlipidemia-could be improved more with increased dose of Crestor.  Prescribed Crestor 10 mg daily and follow-up at time of physical exam in October

## 2017-11-04 DIAGNOSIS — M19071 Primary osteoarthritis, right ankle and foot: Secondary | ICD-10-CM | POA: Diagnosis not present

## 2017-11-04 DIAGNOSIS — M659 Synovitis and tenosynovitis, unspecified: Secondary | ICD-10-CM | POA: Diagnosis not present

## 2017-11-11 DIAGNOSIS — M659 Synovitis and tenosynovitis, unspecified: Secondary | ICD-10-CM | POA: Diagnosis not present

## 2017-11-18 ENCOUNTER — Encounter: Payer: Self-pay | Admitting: Internal Medicine

## 2017-11-18 NOTE — Patient Instructions (Signed)
Increase Crestor from 5 to 10 mg daily and follow-up in October for physical exam

## 2018-01-25 ENCOUNTER — Telehealth: Payer: Self-pay | Admitting: Internal Medicine

## 2018-01-25 NOTE — Telephone Encounter (Signed)
Patient states that she spotted yesterday once.  Noticed it in her underwear when she went to the bathroom.  Describes it as bright red and a pretty good amount, but only seen that one time.  She has had no pain, no fever, normal bowel movements, no pain with urination, no burning, no other symptoms.  She has had normal bowel movement this morning and normal UA as well. She is to see you next Friday, 10/18 for CPE.  She wants to know if she should come in to see you prior to next week for this issue?  Or, is it ok to wait until next week.  She doesn't see a GYN, she just see's you at this point.    Phone:  [phone]

## 2018-01-25 NOTE — Telephone Encounter (Signed)
Called patient back on her cell phone and LM for her.  Instructed her to call us back if she has any further questions.  Otherwise, we will see her next Friday, 10/18 for her CPE and address any questions she has regarding this issue at that time as well.

## 2018-01-25 NOTE — Telephone Encounter (Signed)
It camn wait until next week.

## 2018-01-28 ENCOUNTER — Other Ambulatory Visit: Payer: Medicare Other | Admitting: Internal Medicine

## 2018-01-28 DIAGNOSIS — R7302 Impaired glucose tolerance (oral): Secondary | ICD-10-CM | POA: Diagnosis not present

## 2018-01-28 DIAGNOSIS — R011 Cardiac murmur, unspecified: Secondary | ICD-10-CM | POA: Diagnosis not present

## 2018-01-28 DIAGNOSIS — F419 Anxiety disorder, unspecified: Secondary | ICD-10-CM

## 2018-01-28 DIAGNOSIS — E2839 Other primary ovarian failure: Secondary | ICD-10-CM | POA: Diagnosis not present

## 2018-01-28 DIAGNOSIS — Z0000 Encounter for general adult medical examination without abnormal findings: Secondary | ICD-10-CM

## 2018-01-28 DIAGNOSIS — I10 Essential (primary) hypertension: Secondary | ICD-10-CM | POA: Diagnosis not present

## 2018-01-28 DIAGNOSIS — E7849 Other hyperlipidemia: Secondary | ICD-10-CM

## 2018-01-29 ENCOUNTER — Ambulatory Visit: Payer: Medicare Other | Admitting: Internal Medicine

## 2018-01-29 LAB — COMPLETE METABOLIC PANEL WITH GFR
AG RATIO: 1.1 (calc) (ref 1.0–2.5)
ALT: 22 U/L (ref 6–29)
AST: 21 U/L (ref 10–35)
Albumin: 4 g/dL (ref 3.6–5.1)
Alkaline phosphatase (APISO): 103 U/L (ref 33–130)
BUN: 13 mg/dL (ref 7–25)
CALCIUM: 9.8 mg/dL (ref 8.6–10.4)
CO2: 26 mmol/L (ref 20–32)
Chloride: 102 mmol/L (ref 98–110)
Creat: 0.71 mg/dL (ref 0.50–0.99)
GFR, EST AFRICAN AMERICAN: 103 mL/min/{1.73_m2} (ref >=60)
GFR, EST NON AFRICAN AMERICAN: 89 mL/min/{1.73_m2} (ref >=60)
Globulin: 3.8 g/dL (calc) — ABNORMAL HIGH (ref 1.9–3.7)
Glucose, Bld: 91 mg/dL (ref 65–99)
POTASSIUM: 4 mmol/L (ref 3.5–5.3)
Sodium: 138 mmol/L (ref 135–146)
Total Bilirubin: 0.7 mg/dL (ref 0.2–1.2)
Total Protein: 7.8 g/dL (ref 6.1–8.1)

## 2018-01-29 LAB — CBC WITH DIFFERENTIAL/PLATELET
BASOS ABS: 29 {cells}/uL (ref 0–200)
Basophils Relative: 0.3 %
EOS ABS: 163 {cells}/uL (ref 15–500)
Eosinophils Relative: 1.7 %
HCT: 38.9 % (ref 35.0–45.0)
HEMOGLOBIN: 13 g/dL (ref 11.7–15.5)
LYMPHS ABS: 2803 {cells}/uL (ref 850–3900)
MCH: 29.4 pg (ref 27.0–33.0)
MCHC: 33.4 g/dL (ref 32.0–36.0)
MCV: 88 fL (ref 80.0–100.0)
MONOS PCT: 6.2 %
MPV: 10.9 fL (ref 7.5–12.5)
NEUTROS ABS: 6010 {cells}/uL (ref 1500–7800)
Neutrophils Relative %: 62.6 %
Platelets: 374 10*3/uL (ref 140–400)
RBC: 4.42 10*6/uL (ref 3.80–5.10)
RDW: 12.7 % (ref 11.0–15.0)
Total Lymphocyte: 29.2 %
WBC mixed population: 595 cells/uL (ref 200–950)
WBC: 9.6 10*3/uL (ref 3.8–10.8)

## 2018-01-29 LAB — MICROALBUMIN / CREATININE URINE RATIO
Creatinine, Urine: 182 mg/dL (ref 20–275)
MICROALB UR: 1.2 mg/dL
MICROALB/CREAT RATIO: 7 ug/mg{creat} (ref <30)

## 2018-01-29 LAB — HEMOGLOBIN A1C
EAG (MMOL/L): 6.2 (calc)
Hgb A1c MFr Bld: 5.5 % of total Hgb (ref <5.7)
MEAN PLASMA GLUCOSE: 111 (calc)

## 2018-01-29 LAB — LIPID PANEL
CHOLESTEROL: 274 mg/dL — AB (ref <200)
HDL: 52 mg/dL (ref >50)
LDL Cholesterol (Calc): 195 mg/dL (calc) — ABNORMAL HIGH
NON-HDL CHOLESTEROL (CALC): 222 mg/dL — AB (ref <130)
Total CHOL/HDL Ratio: 5.3 (calc) — ABNORMAL HIGH (ref <5.0)
Triglycerides: 125 mg/dL (ref <150)

## 2018-01-29 LAB — TSH: TSH: 3.5 mIU/L (ref 0.40–4.50)

## 2018-02-02 ENCOUNTER — Other Ambulatory Visit: Payer: Medicare Other | Admitting: Internal Medicine

## 2018-02-03 ENCOUNTER — Other Ambulatory Visit: Payer: Self-pay | Admitting: Internal Medicine

## 2018-02-05 ENCOUNTER — Encounter: Payer: Medicare Other | Admitting: Internal Medicine

## 2018-02-15 ENCOUNTER — Ambulatory Visit (INDEPENDENT_AMBULATORY_CARE_PROVIDER_SITE_OTHER): Payer: Medicare Other | Admitting: Internal Medicine

## 2018-02-15 ENCOUNTER — Other Ambulatory Visit: Payer: Self-pay

## 2018-02-15 ENCOUNTER — Other Ambulatory Visit (HOSPITAL_COMMUNITY)
Admission: RE | Admit: 2018-02-15 | Discharge: 2018-02-15 | Disposition: A | Discharge status: Home / Self Care | Payer: Medicare Other | Source: Ambulatory Visit | Attending: Internal Medicine | Admitting: Internal Medicine

## 2018-02-15 ENCOUNTER — Encounter: Payer: Self-pay | Admitting: Internal Medicine

## 2018-02-15 VITALS — BP 138/82 | HR 57 | Ht 62.0 in | Wt 198.0 lb

## 2018-02-15 DIAGNOSIS — N950 Postmenopausal bleeding: Secondary | ICD-10-CM

## 2018-02-15 LAB — HEMOCCULT GUIAC POC 1CARD (OFFICE): Fecal Occult Blood, POC: NEGATIVE

## 2018-02-15 NOTE — Patient Instructions (Signed)
Pap smear performed and pending.  Stool guaiac negative.  Please call [facility] to see when your repeat colonoscopy is due.

## 2018-02-15 NOTE — Progress Notes (Signed)
   Subjective:    Patient ID: [name], female    DOB: March 16, 1951, 67 y.o.   MRN: [id number]  HPI 67 year old Black Female who had one episode of one vaginal blood spot about 3 or 4 weeks ago while sitting watching TV.  She has been menopausal since her late 10s.  Did not take estrogen replacement.  Had Pap smear here 2016 which was normal.  Also asking about colon cancer screening.  Our records indicate an order was placed with Dr. Madilyn Fireman in 2011 [facility] but I do not have the result.  She recalls going for colonoscopy and I have asked her to call them to see when she is due again.  Since her sister has history of colon cancer.  Patient does not recall having had vigorous intercourse or significant irritation in the vaginal area to cause this bleeding.    Review of Systems see above     Objective:   Physical Exam Normal female external genitalia.  Pap smear taken.  Atrophic vaginitis noted.  Cervix appears to be normal.  Loss is closed.  No masses appreciated on bimanual exam.  Rectovaginal confirms.  Stool is guaiac negative.       Assessment & Plan:  Vaginal spotting x1 with only one spot of blood noted.  Patient is to notify me if recurs.  Pap smear pending.  Colon cancer screening-asked patient to call [facility] to see when callback was due.

## 2018-02-16 LAB — CYTOLOGY - PAP: Diagnosis: NEGATIVE

## 2018-04-23 ENCOUNTER — Encounter: Payer: Medicare Other | Admitting: Internal Medicine

## 2018-05-18 ENCOUNTER — Other Ambulatory Visit: Payer: Self-pay | Admitting: Internal Medicine

## 2018-05-25 ENCOUNTER — Encounter: Payer: Self-pay | Admitting: Internal Medicine

## 2018-05-25 ENCOUNTER — Ambulatory Visit (INDEPENDENT_AMBULATORY_CARE_PROVIDER_SITE_OTHER): Payer: Medicare Other | Admitting: Internal Medicine

## 2018-05-25 VITALS — BP 130/80 | HR 60 | Temp 98.5°F | Ht 62.0 in | Wt 192.0 lb

## 2018-05-25 DIAGNOSIS — I10 Essential (primary) hypertension: Secondary | ICD-10-CM

## 2018-05-25 DIAGNOSIS — E7800 Pure hypercholesterolemia, unspecified: Secondary | ICD-10-CM | POA: Diagnosis not present

## 2018-05-25 DIAGNOSIS — R7302 Impaired glucose tolerance (oral): Secondary | ICD-10-CM | POA: Diagnosis not present

## 2018-05-25 DIAGNOSIS — R413 Other amnesia: Secondary | ICD-10-CM | POA: Diagnosis not present

## 2018-05-25 NOTE — Progress Notes (Signed)
Subjective:    Patient ID: [name], female    DOB: 10/27/1950, 68 y.o.   MRN: [id number]  HPI 68 year old Black Female has been a patient here for a number of years.  She has a history of hypertension, hyperlipidemia, impaired glucose tolerance, sleep apnea.  Surgery for left ganglion cyst 1989.  Bilateral tubal ligation 1983.  Right knee replacement February 2010.  History of anxiety depression.  Social history: She is married.  Has 3 adult children.  She retired as a Occupational psychologist for [employer].  Husband retired as an Forensic psychologist.  Does not smoke and very occasional alcohol consumption.  Family history: Father with history of hypertension died of renal failure and congestive heart failure with history of subdural hematoma.  He was 67 years old.  Mother died at age 67 of diabetes and hypertension and apparently had memory issues according to husband.  One brother with history of hypertension and renal failure deceased.  2 sisters- one  with history of colon cancer and other sister died in 67 of a cerebral hemorrhage.  She is accompanied by her husband today.  Husband tells me that the family has been concerned recently about her memory.  There was a gender reveal party for her daughter recently and apparently a few days later and the patient could not really remember much about the party or the gender of the baby.  We have noticed recently she has had some issues on a couple of occasions with missing appointments.  We did not think too much about it at the time.  Her affect appeared to be normal.  Was started on Crestor in January 68 due to a cholesterol of 280 with an LDL cholesterol of 197.  Triglycerides were normal.  In 2018 was seen for anxiety.  She indicated at the time that she did not have much free time for herself and her husband had retired as well and he wanted to go everywhere she went.  Has been on Zoloft 100 mg daily for a number of years.   Has been prescribed Xanax twice daily sparingly for anxiety in the past.    She has a history of innocent cardiac murmur.  Her last colonoscopy I believe was in 2011 and was performed by Dr. Madilyn Fireman.  We had asked her to call and see when the next colonoscopy was due as we do not have a copy of that report.  B12 and folate levels are normal.  CBC is normal.  Sed rate is elevated at 65.  RPR is nonreactive.  TSH and free T4 are normal.  Calcium elevated at 10.5 normal being up to 10.4 and an intact PTH and repeat calcium were drawn on February 7 and are pending.  Review of Systems denies headache or visual disturbances.  No history of head trauma.  Husband reports she does not really want to do very much anymore.  She used to enjoy going to the gym but seems to be a bit apathetic.  Has not had issues with crying.     Objective:   Physical Exam Vital signs reviewed.  Blood pressure 130/80, pulse 60, temperature 98.5 degrees, pulse oximetry 96%.  Weight 192 pounds.  Cranial nerves II through XII are grossly intact.  Funduscopic exam is benign.  She is alert.  She knows the name of President of the [place].  She does serial sevens well but quite slowly.  Not oriented to day of week.  Can only remember 1 out of 3 objects after 5 minutes. Knows the year.  When asked about difficulty remembering details of her daughter's gender reveal party she is slightly defensive.  She is cooperative and her affect is quiet.  Muscle strength is normal in the upper and lower extremities.  Cerebellar finger-to-nose testing is normal.  Her gait is normal.  PERRLA.  TMs are clear.  Pharynx is clear.  Neck is supple without adenopathy or thyromegaly.  Her chest is clear.  Cardiac exam regular rate and rhythm murmur is unchanged.  Extremities without edema.       Assessment & Plan:  I am truly concerned about her presentation today.  She clearly has some issues with memory.  Labs are drawn and pending.  I am going to  asked that she and her husband seek consultation with Neurology.  She needs imaging of her brain.  There is no history of seizures according to her husband.  I am not certain what is causing her elevated sed rate.  50 minutes spent with patient and her husband today including evaluation, history taking and chart review.

## 2018-05-26 LAB — COMPLETE METABOLIC PANEL WITH GFR
AG RATIO: 1.1 (calc) (ref 1.0–2.5)
ALT: 18 U/L (ref 6–29)
AST: 19 U/L (ref 10–35)
Albumin: 4.3 g/dL (ref 3.6–5.1)
Alkaline phosphatase (APISO): 109 U/L (ref 37–153)
BILIRUBIN TOTAL: 0.5 mg/dL (ref 0.2–1.2)
BUN: 17 mg/dL (ref 7–25)
CALCIUM: 10.5 mg/dL — AB (ref 8.6–10.4)
CHLORIDE: 100 mmol/L (ref 98–110)
CO2: 25 mmol/L (ref 20–32)
Creat: 0.79 mg/dL (ref 0.50–0.99)
GFR, EST AFRICAN AMERICAN: 90 mL/min/{1.73_m2} (ref >=60)
GFR, EST NON AFRICAN AMERICAN: 78 mL/min/{1.73_m2} (ref >=60)
GLOBULIN: 3.9 g/dL — AB (ref 1.9–3.7)
Glucose, Bld: 92 mg/dL (ref 65–99)
POTASSIUM: 4.1 mmol/L (ref 3.5–5.3)
SODIUM: 137 mmol/L (ref 135–146)
Total Protein: 8.2 g/dL — ABNORMAL HIGH (ref 6.1–8.1)

## 2018-05-26 LAB — CBC WITH DIFFERENTIAL/PLATELET
Absolute Monocytes: 759 cells/uL (ref 200–950)
BASOS ABS: 62 {cells}/uL (ref 0–200)
Basophils Relative: 0.6 %
EOS PCT: 1.5 %
Eosinophils Absolute: 156 cells/uL (ref 15–500)
HEMATOCRIT: 40.4 % (ref 35.0–45.0)
HEMOGLOBIN: 13.4 g/dL (ref 11.7–15.5)
Lymphs Abs: 3661 cells/uL (ref 850–3900)
MCH: 29.4 pg (ref 27.0–33.0)
MCHC: 33.2 g/dL (ref 32.0–36.0)
MCV: 88.6 fL (ref 80.0–100.0)
MONOS PCT: 7.3 %
MPV: 11.2 fL (ref 7.5–12.5)
NEUTROS ABS: 5762 {cells}/uL (ref 1500–7800)
Neutrophils Relative %: 55.4 %
PLATELETS: 410 10*3/uL — AB (ref 140–400)
RBC: 4.56 10*6/uL (ref 3.80–5.10)
RDW: 13.1 % (ref 11.0–15.0)
Total Lymphocyte: 35.2 %
WBC: 10.4 10*3/uL (ref 3.8–10.8)

## 2018-05-26 LAB — FOLATE

## 2018-05-26 LAB — TIQ-NTM

## 2018-05-26 LAB — SEDIMENTATION RATE: Sed Rate: 65 mm/h — ABNORMAL HIGH (ref 0–30)

## 2018-05-26 LAB — VITAMIN B12: VITAMIN B 12: 1807 pg/mL — AB (ref 200–1100)

## 2018-05-26 LAB — T4, FREE: Free T4: 1.1 ng/dL (ref 0.8–1.8)

## 2018-05-26 LAB — RPR: RPR Ser Ql: NONREACTIVE

## 2018-05-26 LAB — TSH: TSH: 3.64 mIU/L (ref 0.40–4.50)

## 2018-05-27 ENCOUNTER — Ambulatory Visit: Payer: Medicare Other | Admitting: Neurology

## 2018-05-27 ENCOUNTER — Telehealth: Payer: Self-pay

## 2018-05-27 NOTE — Telephone Encounter (Signed)
I called pt, advised her that our office is closing early due to inclement weather. Pt is rescheduled for 20/01/2019 at 1:00pm. Pt verbalized understanding of new appt date and time.

## 2018-05-28 ENCOUNTER — Other Ambulatory Visit: Payer: Medicare Other | Admitting: Internal Medicine

## 2018-05-30 NOTE — Patient Instructions (Signed)
Labs drawn and pending. Neurology appt obtained for next week.

## 2018-05-31 ENCOUNTER — Ambulatory Visit (INDEPENDENT_AMBULATORY_CARE_PROVIDER_SITE_OTHER): Payer: Medicare Other | Admitting: Neurology

## 2018-05-31 ENCOUNTER — Encounter: Payer: Self-pay | Admitting: Neurology

## 2018-05-31 VITALS — BP 142/80 | HR 65 | Ht 62.0 in | Wt 195.0 lb

## 2018-05-31 DIAGNOSIS — G4733 Obstructive sleep apnea (adult) (pediatric): Secondary | ICD-10-CM

## 2018-05-31 DIAGNOSIS — R4589 Other symptoms and signs involving emotional state: Secondary | ICD-10-CM

## 2018-05-31 DIAGNOSIS — G3184 Mild cognitive impairment, so stated: Secondary | ICD-10-CM | POA: Diagnosis not present

## 2018-05-31 DIAGNOSIS — R413 Other amnesia: Secondary | ICD-10-CM

## 2018-05-31 LAB — PTH, INTACT AND CALCIUM
Calcium: 10 mg/dL (ref 8.6–10.4)
PTH: 28 pg/mL (ref 14–64)

## 2018-05-31 NOTE — Progress Notes (Signed)
Subjective:    Patient ID: [name] is a 68 y.o. female.  HPI     Star Age, MD, PhD [facility] [address] [place], [place] [address]  Dear Dr. Renold Genta,   I saw your patient, [name], upon your kind request in neurologic clinic today for initial consultation of her memory loss. The patient is accompanied by her husband today. As you know, Ms. [name] is a 67 year old right-handed woman with an underlying medical history of hypertention, hyperlipidemia, anemia, prediabetes, sleep apnea, and obesity, who reports increased forgetfulness. Her husband reports that she was diagnosed with mild congnitive impairment some 4-5 years ago. There was no FU testing or treatment at the time. Her husband reports that she has been more forgetful, repetitive, asking the same questions over and over again. Her driving seems to be good. She has not gotten lost. They take turns with a drive up to [place].  She endorses stress and attributes some of her forgetfulness to stress.  She reported sleeping fairly well. Sometimes she goes to bed late and sleeps in late. She does snore per husband's report. Her Epworth sleepiness score is 6 out of 24 today. I reviewed your office note from 05/25/2018. She had blood work through your office at the time which I reviewed. She had B12, folate, CBC with differential, CMP, ESR, RPR, TSH, free T4. B12 was elevated above 1000, folate normal, platelets slightly elevated at 410 CMP showed slightly elevated calcium and total protein, ESR mildly elevated at 65, RPR nonreactive, TSH and free T4 normal. She was advised to return for additional laboratory testing including parathyroid hormone and calcium.  She had a sleep study 2008 and was diagnosed with sleep apnea. She has not been on CPAP therapy. She had seen Dr. Valentina Shaggy in December 2016 on 03/22/2015 and 03/30/2015 and had neuropsychological evaluation. She was diagnosed with mild  cognitive impairment in the setting of depression. He recommended that she be restarted on antidepressant medication and consider counseling. She reports taking her pills regularly, but her husband is not so sure. She had b/l TKAs. She has no FHx of dementia. 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 Has 3 children, twin daughters in [place] and son in [place]. She admits that since her retirement she has become more withdrawn. She also adds that she is content being by herself. She has residual depression, and was supposed to increase the Zoloft. [facility] but not [facility]. She is a nonsmoker and drinks alcohol rarely, no daily caffeine, does drink occasional tea. She does not always hydrate well enough she admits, does not typically get 8 cups of water per day.  Her Past Medical History Is Significant For: Past Medical History:  Diagnosis Date  . Anemia   . Hyperlipidemia   . Hypertension   . Pre-diabetes   . Sleep apnea     Her Past Surgical History Is Significant For: Past Surgical History:  Procedure Laterality Date  . GANGLION CYST EXCISION     l hand  . JOINT REPLACEMENT     knee right  . TUBAL LIGATION      Her Family History Is Significant For: Family History  Problem Relation Age of Onset  . Diabetes Mother   . Hypertension Mother   . Kidney disease Father   . Heart disease Father   . Stroke Sister     Her Social History Is Significant For: Social  History   Socioeconomic History  . Marital status: Married    Spouse name: Not on file  . Number of children: Not on file  . Years of education: Not on file  . Highest education level: Not on file  Occupational History  . Not on file  Social Needs  . Financial resource strain: Not on file  . Food insecurity:    Worry: Not on file    Inability: Not on file  . Transportation needs:    Medical: Not on file    Non-medical: Not on file  Tobacco Use  . Smoking status: Never Smoker  .  Smokeless tobacco: Never Used  Substance and Sexual Activity  . Alcohol use: Yes    Comment: rarely  . Drug use: No  . Sexual activity: Not on file  Lifestyle  . Physical activity:    Days per week: Not on file    Minutes per session: Not on file  . Stress: Not on file  Relationships  . Social connections:    Talks on phone: Not on file    Gets together: Not on file    Attends religious service: Not on file    Active member of club or organization: Not on file    Attends meetings of clubs or organizations: Not on file    Relationship status: Not on file  Other Topics Concern  . Not on file  Social History Narrative  . Not on file    Her Allergies Are:  No Known Allergies:   Her Current Medications Are:  Outpatient Encounter Medications as of 05/31/2018  Medication Sig  . Cholecalciferol (VITAMIN D) 1000 UNITS capsule Take 1,000 Units by mouth daily.    . cyanocobalamin 500 MCG tablet Take 500 mcg by mouth daily.  . ferrous sulfate 325 (65 FE) MG EC tablet Take 325 mg by mouth daily.   Marland Kitchen lisinopril (PRINIVIL,ZESTRIL) 10 MG tablet TAKE ONE TABLET BY MOUTH DAILY  . Multiple Vitamins-Minerals (MULTIVITAMIN WITH MINERALS) tablet Take 1 tablet by mouth daily.    . sertraline (ZOLOFT) 100 MG tablet TAKE ONE TABLET BY MOUTH DAILY  . triamterene-hydrochlorothiazide (MAXZIDE-25) 37.5-25 MG tablet TAKE ONE TABLET BY MOUTH DAILY  . vitamin E (VITAMIN E) 400 UNIT capsule Take 400 Units by mouth daily.  . [DISCONTINUED] rosuvastatin (CRESTOR) 10 MG tablet Take 1 tablet (10 mg total) by mouth daily.   No facility-administered encounter medications on file as of 05/31/2018.   : Review of Systems:  Out of a complete 14 point review of systems, all are reviewed and negative with the exception of these symptoms as listed below:  Review of Systems  Neurological:       Pt presents today to discuss her memory. Pt reports that she is stressed and thinks her forgetfulness is related to that.     Objective:  Neurological Exam  Physical Exam Physical Examination:   Vitals:   05/31/18 1251  BP: (!) 142/80  Pulse: 65    General Examination: The patient is a very pleasant 68 y.o. female in no acute distress. She appears well-developed and well-nourished and well groomed.   HEENT: Normocephalic, atraumatic, pupils are equal, round and reactive to light and accommodation. Extraocular tracking is good without limitation to gaze excursion or nystagmus noted. Normal smooth pursuit is noted. Hearing is grossly intact. Face is symmetric with normal facial animation and normal facial sensation. Speech is clear with no dysarthria noted. There is no hypophonia. There is no lip, neck/head,  jaw or voice tremor. Neck is supple with full range of passive and active motion. There are no carotid bruits on auscultation. Oropharynx exam reveals: mild mouth dryness, good dental hygiene and mild airway crowding, due to redundant soft palate and smaller airway entry. Mallampati is class II. Tongue protrudes centrally and palate elevates symmetrically. Tonsils are absent. Neck size is 16 inches.    Chest: Clear to auscultation without wheezing, rhonchi or crackles noted.  Heart: S1+S2+0, regular and normal without murmurs, rubs or gallops noted.   Abdomen: Soft, non-tender and non-distended with normal bowel sounds appreciated on auscultation.  Extremities: There is no pitting edema in the distal lower extremities bilaterally.   Skin: Warm and dry without trophic changes noted.  Musculoskeletal: exam reveals no obvious joint deformities, tenderness or joint swelling or erythema.   Neurologically:  Mental status: The patient is awake, alert and oriented in all 4 spheres. Her immediate and remote memory, attention, language skills and fund of knowledge are appropriate. There is no evidence of aphasia, agnosia, apraxia or anomia. Speech is clear with normal prosody and enunciation. Thought process is  linear. Mood is constricted and affect is blunted.  Cranial nerves II - XII are as described above under HEENT exam. In addition: shoulder shrug is normal with equal shoulder height noted. Motor exam: Normal bulk, strength and tone is noted. There is no tremor.  On 05/31/2018: MMSE: 20/30 but variable effort noted, pt reported feeling stressed, CDT: 4/4, AFP: 10/min.   Romberg is negative. Reflexes are 1+ throughout. Fine motor skills and coordination: intact with normal finger taps, normal hand movements, normal rapid alternating patting, normal foot taps and normal foot agility.  Cerebellar testing: No dysmetria or intention tremor. There is no truncal or gait ataxia.  Sensory exam: intact to light touch in the upper and lower extremities.  Gait, station and balance: She stands easily. No veering to one side is noted. No leaning to one side is noted. Posture is age-appropriate and stance is narrow based. Gait shows normal stride length and normal pace. No problems turning are noted. Tandem walk is unremarkable. Intact toe and heel stance is noted.               Assessment and Plan:   In summary, TANNY HARNACK is a very pleasant 68 y.o.-year old female with an underlying medical history of hypertention, hyperlipidemia, anemia, prediabetes, sleep apnea, and obesity, who presents for evaluation of her memory loss. Her MMSE is abnormal but she also had some variable effort today. She endorses stress and suboptimally controlled depression. She may benefit from additional depression management, she reports that she was supposed to increase her sertraline at her last visit.  I had a long chat with the patient and her about my findings and the diagnosis of MCI, Memory loss, and dementia, the prognosis and treatment options. We talked about medical treatments and non-pharmacological approaches. We talked about maintaining a healthy lifestyle in general. I encouraged the patient to eat healthy, exercise daily  and keep well hydrated, to keep a scheduled bedtime and wake time routine, to not skip any meals and eat healthy snacks in between meals and to have protein with every meal.   As far as further diagnostic testing is concerned, I suggested the following today: MRI brain w and w/o Gad, and I will request repeat neuropsychological evaluation. I did not suggest any new medications but she is encouraged to talk to about optimization of her depression regimen. We will  also proceed with a sleep study to rule out obstructive sleep apnea as she carries a prior diagnosis of this. I will see her back after testing is completed. I answered all their questions today and the patient and her husband were in agreement.  Thank you very much for allowing me to participate in the care of this nice patient. If I can be of any further assistance to you please do not hesitate to call me at [phone].  Sincerely,   Star Age, MD, PhD

## 2018-05-31 NOTE — Patient Instructions (Signed)
You have complaints of memory loss: memory loss or changes in cognitive function can have many reasons and does not always mean you have dementia. Conditions that can contribute to subjective or objective memory loss include: depression, stress, poor sleep from insomnia or sleep apnea, dehydration, fluctuation in blood sugar values, thyroid or electrolyte dysfunction and certain vitamin deficiencies. Dementia can be caused by stroke, brain atherosclerosis or brain vascular disease due to vascular risk factors (smoking, high blood pressure, high cholesterol, obesity and uncontrolled diabetes), certain degenerative brain disorders (including Parkinson's disease and Multiple sclerosis) and by Alzheimer's disease or other, more rare and sometimes hereditary causes. We will do some additional testing: blood work (which has been done recently already) and we will do a brain scan. We will not start medication as yet. We will also request a formal cognitive test called neuropsychological evaluation which is done by a licensed neuropsychologist. We will make a referral in that regard.  We will do a sleep study also. We will call you with brain scan test results and monitor your symptoms. You may benefit from increasing or optimizing your depression medicine.

## 2018-05-31 NOTE — Progress Notes (Signed)
Thanks for you thorough evaluation.

## 2018-05-31 NOTE — Progress Notes (Signed)
Epworth Sleepiness Scale 0= would never doze 1= slight chance of dozing 2= moderate chance of dozing 3= high chance of dozing  Sitting and reading: 1 Watching TV: 1 Sitting inactive in a public place (ex. Theater or meeting): 0 As a passenger in a car for an hour without a break: 1 Lying down to rest in the afternoon: 3 Sitting and talking to someone: 0 Sitting quietly after lunch (no alcohol): 0 In a car, while stopped in traffic: 0 Total: 6

## 2018-06-01 ENCOUNTER — Telehealth: Payer: Self-pay | Admitting: Neurology

## 2018-06-01 NOTE — Telephone Encounter (Signed)
Medicare order sent to GI. No auth they will reach out to the pt to schedule.  °

## 2018-06-01 NOTE — Telephone Encounter (Signed)
lvm for pt to be aware I left GI phone number of [phone] and to give them a call if she has not heard from them in the next 2-3 business days.

## 2018-06-14 ENCOUNTER — Other Ambulatory Visit: Payer: Medicare Other

## 2018-06-30 ENCOUNTER — Other Ambulatory Visit: Payer: Self-pay

## 2018-06-30 ENCOUNTER — Ambulatory Visit
Admission: RE | Admit: 2018-06-30 | Discharge: 2018-06-30 | Disposition: A | Discharge status: Home / Self Care | Payer: Medicare Other | Source: Ambulatory Visit | Attending: Neurology | Admitting: Neurology

## 2018-06-30 DIAGNOSIS — R413 Other amnesia: Secondary | ICD-10-CM | POA: Diagnosis not present

## 2018-06-30 DIAGNOSIS — G4733 Obstructive sleep apnea (adult) (pediatric): Secondary | ICD-10-CM

## 2018-06-30 DIAGNOSIS — R4589 Other symptoms and signs involving emotional state: Secondary | ICD-10-CM

## 2018-06-30 DIAGNOSIS — G3184 Mild cognitive impairment, so stated: Secondary | ICD-10-CM

## 2018-06-30 MED ORDER — GADOBENATE DIMEGLUMINE 529 MG/ML IV SOLN
15.0000 mL | Freq: Once | INTRAVENOUS | Status: AC | PRN
  Administered 2018-06-30: 15 mL via INTRAVENOUS

## 2018-07-01 ENCOUNTER — Telehealth: Payer: Self-pay

## 2018-07-01 NOTE — Telephone Encounter (Signed)
I called pt to discuss her MRI results. No answer, left a message asking her to call me back. 

## 2018-07-01 NOTE — Telephone Encounter (Signed)
-----   Message from Huston Foley, MD sent at 07/01/2018  1:10 PM EDT ----- Brain MRI shows age-appropriate findings, no acute findings, no stroke, no lesions, no abnormal contrast uptake, all in all reassuring findings. As discussed, will proceed with neuropsychological evaluation, and sleep study. Sleep study appears to be scheduled but I do not see any appointments for neuropsychology, please inquire. Please update patient by phone call regarding her brain scan result. [name]

## 2018-07-01 NOTE — Progress Notes (Signed)
Brain MRI shows age-appropriate findings, no acute findings, no stroke, no lesions, no abnormal contrast uptake, all in all reassuring findings. As discussed, will proceed with neuropsychological evaluation, and sleep study. Sleep study appears to be scheduled but I do not see any appointments for neuropsychology, please inquire. Please update patient by phone call regarding her brain scan result. [name]

## 2018-07-02 NOTE — Telephone Encounter (Signed)
I contacted the pt's husband and provided # to Rodenbaugh's office, [phone]. Husband was agreeable to calling.

## 2018-07-02 NOTE — Telephone Encounter (Signed)
Pt husband(on DPR)has returned call to RN for results.  Please call

## 2018-07-02 NOTE — Telephone Encounter (Signed)
Please provide ph number to Dr. Elyn Aquas office.

## 2018-07-02 NOTE — Telephone Encounter (Signed)
I contacted the husbandGenevie Cheshire ok per dpr) and advised of results he confirmed the pt has scheduled her sleep study but claims she did not receive a call to schedule the Neuro psych appt.  I provided the # to Dr. Maxwell Marion office  p: [phone] and advised Mr. Sprinkles to call.  It appears our office sent the necessary documents/orders on 2/12 for this referral.  Husband states he will call about getting testing set up.

## 2018-08-05 ENCOUNTER — Telehealth: Payer: Self-pay

## 2018-08-05 DIAGNOSIS — G4733 Obstructive sleep apnea (adult) (pediatric): Secondary | ICD-10-CM

## 2018-08-05 NOTE — Telephone Encounter (Signed)
VO for HST from Dr. Athar received. HST order placed.  

## 2018-08-05 NOTE — Telephone Encounter (Signed)
Can we get a HST order so patient does not have to wait until lab opens up?

## 2018-08-07 ENCOUNTER — Other Ambulatory Visit: Payer: Self-pay | Admitting: Internal Medicine

## 2018-08-26 ENCOUNTER — Ambulatory Visit: Payer: Medicare Other | Admitting: Internal Medicine

## 2018-09-06 ENCOUNTER — Telehealth: Payer: Self-pay | Admitting: Neurology

## 2018-09-06 NOTE — Telephone Encounter (Signed)
Due to current COVID 19 pandemic, our office is severely reducing in office visits until further notice, in order to minimize the risk to our patients and healthcare providers.   Called patient to offer a virtual visit for her 5/20 appointment. Patient declined and stated she would rather wait to come in the office. I offered her the option to come in with a few precautions. She verbalized understanding that upon arrival she will have her temperature taken and will be asked screening questions in reference to COVID-19. She understands that she will receive a call from RN prior to appointment to update chart.

## 2018-09-07 NOTE — Telephone Encounter (Signed)
Dr. Frances Furbish recommended that this pt's appt be delayed until neuro psych and sleep study testing have been completed.   I called pt. She is agreeable to this. Rescheduled for 10/20/18 at 9:30am. Pt verbalized understanding of new appt date and time.

## 2018-09-08 ENCOUNTER — Ambulatory Visit: Payer: Medicare Other | Admitting: Neurology

## 2018-09-08 NOTE — Telephone Encounter (Signed)
I called Kalman Drape asking her to call me back about patient's referral for Dr. Kieth Brightly. [phone] .

## 2018-09-14 ENCOUNTER — Other Ambulatory Visit: Payer: Self-pay | Admitting: Internal Medicine

## 2018-09-28 NOTE — Telephone Encounter (Signed)
Dr. Chelsea Aus bough's  office  Called the patient twice in Feb. I will call the patient and give her the number so she can call and schedule [phone]. Thanks Hinton Dyer .

## 2018-10-06 ENCOUNTER — Ambulatory Visit (INDEPENDENT_AMBULATORY_CARE_PROVIDER_SITE_OTHER): Payer: Medicare Other | Admitting: Neurology

## 2018-10-06 DIAGNOSIS — G4733 Obstructive sleep apnea (adult) (pediatric): Secondary | ICD-10-CM | POA: Diagnosis not present

## 2018-10-13 ENCOUNTER — Telehealth: Payer: Self-pay

## 2018-10-13 NOTE — Telephone Encounter (Signed)
-----   Message from Star Age, MD sent at 10/13/2018  8:35 AM EDT ----- Patient referred by Dr. Renold Genta, seen by me on 05/31/18 for cognitive complaints and she gave a prior Dx of OSA, had HST on 10/07/18.    Please call and notify the patient that the recent home sleep test showed obstructive sleep apnea in the moderate range. While I recommend treatment for this in the form CPAP, we are not yet bringing patients in for in-lab testing for CPAP titration studies, due to the virus pandemic; therefore, I suggest we start her on a trial of autoPAP at home, which means, that we don't have to bring her in for a sleep study with CPAP, but will let her start using an autoPAP machine at home, through a DME company (of patient's choice, or as per insurance requirement, as per in [employer], if there are such restrictions, depending on insurance carrier). The DME representative will educate the patient on how to use the machine, how to put the mask on, etc. I have placed an order in the chart. Please send referral, talk to patient, send report to referring MD. We will need a FU in sleep clinic for 10 weeks post-PAP set up, please arrange that with me or one of our NPs.  Also, please remind patient about the importance of compliance with PAP usage; this is an Designer, industrial/product, but good compliance also helps [place] track improvements in patient's sleep related complaints and objective improvements, such as BP and weight for example or nocturia or headaches, etc. Especially, given her cognitive complaints, treatment of her OSA is recommended.  For concerns and questions about how to clean the PAP machine and the supplies and how frequently to change the hose, mask and filters, etc., patient can call the DME company, for more information, education and troubleshooting. Especially in the current situation, I recommend, patients be extra mindful about hand hygiene, handling the PAP equipment only with clean hands, wipe the  mask daily, keep little one and four-legged companions (and any other pets for that matter) away from the machine and mask at all times.     Thanks,   Star Age, MD, PhD [facility])

## 2018-10-13 NOTE — Procedures (Signed)
Patient Information     First Name: Steward DroneBrenda Last Name: Lynita Khan ID: [id number]  Birth Date: Aug 11, 1950 Age: [age] Gender: Female  Referring Provider: Margaree MackintoshBaxley, Mary J, MD BMI: 36.1 (W=196 lb, H=5' 2'')  Neck Circ.:  16 Epworth:   Sleep Study Information    Study Date: Oct 07, 2018 S/H/A Version: 5.1.77.7 / 4.1.1528 / 3177  History: 68 year old woman with a history of hypertension, hyperlipidemia, anemia, prediabetes, sleep apnea, and obesity, who reports increased forgetfulness. She was previously diagnosed with OSA.   Summary & Diagnosis:      OSA  Recommendations:       This home sleep test demonstrates moderate obstructive sleep apnea with a total AHI of 24.4/hour and O2 nadir of 82%. Treatment with positive airway pressure (PAP) - in the form of CPAP - is recommended. This will require, ideally, a full night CPAP titration study for proper treatment settings, O2 monitoring and mask fitting. Based on the severity of the sleep disordered breathing, an attended titration study is indicated. However, under the current circumstances (i.e. the COVID-19 pandemic), in order to ensure continuity of care and for the safety of the patient and healthcare professionals, she will be advised to proceed with an autoPAP titration/trial at home. A proper overnight, lab-attended PAP titration study with CPAP may be helpful or needed down the road to optimize treatment, when considered safe. Please note, that untreated obstructive sleep apnea may carry additional perioperative morbidity. Patients with significant obstructive sleep apnea should receive perioperative PAP therapy and the surgeons and particularly the anesthesiologist should be informed of the diagnosis and the severity of the sleep disordered breathing. Patient will be reminded regarding compliance with the PAP machine and to be mindful of cleanliness with the equipment and timely with supply changes (i.e. changing filter, mask, hose, humidifier chamber  on an ongoing basis, as recommended, and cleaning parts that touch the face and nose daily, etc). The patient should be cautioned not to drive, work at heights, or operate dangerous or heavy equipment when tired or sleepy. Review and reiteration of good sleep hygiene measures should be pursued with any patient. Other causes of the patient's symptoms, including circadian rhythm disturbances, an underlying mood disorder, medication effect and/or an underlying medical problem cannot be ruled out based on this test. Clinical correlation is recommended. The patient and her referring provider will be notified of the test results. The patient will be [facility], either for a face-to-face or virtual visit, whichever feasible and recommended at the time.  I certify that I have reviewed the raw data recording prior to the issuance of this report in accordance with the standards of the American Academy of Sleep Medicine (AASM).  Huston FoleySaima Kyriaki Moder, MD, PhD [facility] Diplomat, ABPN (Neurology and Sleep)          Sleep Summary    Oxygen Saturation Statistics     Start Study Time: End Study Time: Total Recording Time:        1:04:47 AM 10:08:06 AM 9 h, 3 min  Total Sleep Time % REM of Sleep Time:  7 h, 29 min  16.5    Mean: 95 Minimum: 82 Maximum: 99  Mean of Desaturations Nadirs (%):   91  Oxygen Desaturation %:   4-9 10-20 >20 Total  Events Number Total    97  1 99.0 1.0  0 0.0  98 100.0  Oxygen Saturation: <90 <=88 <85 <80 <  70  Duration (minutes): Sleep % 1.2 0.3  0.4 0.0  0.1 0.0 0.0 0.0 0.0 0.0     Respiratory Indices      Total Events REM NREM All Night  pRDI:  210  pAHI:  179 ODI:  98  pAHIc:  0  % CSR: 0.0 51.7 50.1 36.1 0.0 24.1 19.3 8.9 0.0 28.7 24.4 13.4 0.0       Pulse Rate Statistics during Sleep (BPM)      Mean: 54 Minimum: N/A Maximum: 85    Indices are calculated using technically valid sleep  time of  7 hrs, 19 min. pRDI/pAHI are calculated using oxi desaturations ? 3%  Body Position Statistics  Position Supine Prone Right Left Non-Supine  Sleep (min) 209.0 0.0 240.5 0.0 240.5  Sleep % 46.5 0.0 53.5 0.0 53.5  pRDI 37.4 N/A 21.1 N/A 21.1  pAHI 32.1 N/A 17.7 N/A 17.7  ODI 21.6 N/A 6.2 N/A 6.2     Snoring Statistics Snoring Level (dB) >40 >50 >60 >70 >80 >Threshold (45)  Sleep (min) 279.6 17.5 0.8 0.4 0.0 70.1  Sleep % 62.2 3.9 0.2 0.1 0.0 15.6    Mean: 42 dB Sleep Stages Chart                                     pAHI=24.4                                                  Mild              Moderate                    Severe                                                    5              15                    30

## 2018-10-13 NOTE — Addendum Note (Signed)
Addended by: Star Age on: 10/13/2018 08:35 AM   Modules accepted: Orders

## 2018-10-13 NOTE — Telephone Encounter (Signed)
I called pt. I advised pt that Dr. Rexene Alberts reviewed their sleep study results and found that pt has moderate osa. Dr. Rexene Alberts recommends that pt start an auto pap at home. I reviewed PAP compliance expectations with the pt. Pt is agreeable to starting an auto-PAP. I advised pt that an order will be sent to a DME, [facility], and [facility] will call the pt within about one week after they file with the pt's insurance. [facility] will show the pt how to use the machine, fit for masks, and troubleshoot the auto-PAP if needed. A follow up appt was made for insurance purposes with Dr. Rexene Alberts on 12/30/18 at 9:30am. Pt verbalized understanding to arrive 15 minutes early and bring their auto-PAP. A letter with all of this information in it will be mailed to the pt as a reminder. I verified with the pt that the address we have on file is correct. Pt verbalized understanding of results. Pt had no questions at this time but was encouraged to call back if questions arise. I have sent the order to [facility] and have received confirmation that they have received the order.  Pt asked that her 7/1 appt be cancelled. I gave her Dr. Lajuana Carry number again to schedule her neuropsych testing. Pt verbalized understanding.

## 2018-10-13 NOTE — Progress Notes (Signed)
Patient referred by Dr. Renold Genta, seen by me on 05/31/18 for cognitive complaints and she gave a prior Dx of OSA, had HST on 10/07/18.    Please call and notify the patient that the recent home sleep test showed obstructive sleep apnea in the moderate range. While I recommend treatment for this in the form CPAP, we are not yet bringing patients in for in-lab testing for CPAP titration studies, due to the virus pandemic; therefore, I suggest we start her on a trial of autoPAP at home, which means, that we don't have to bring her in for a sleep study with CPAP, but will let her start using an autoPAP machine at home, through a DME company (of patient's choice, or as per insurance requirement, as per in [employer], if there are such restrictions, depending on insurance carrier). The DME representative will educate the patient on how to use the machine, how to put the mask on, etc. I have placed an order in the chart. Please send referral, talk to patient, send report to referring MD. We will need a FU in sleep clinic for 10 weeks post-PAP set up, please arrange that with me or one of our NPs.  Also, please remind patient about the importance of compliance with PAP usage; this is an Designer, industrial/product, but good compliance also helps [place] track improvements in patient's sleep related complaints and objective improvements, such as BP and weight for example or nocturia or headaches, etc. Especially, given her cognitive complaints, treatment of her OSA is recommended.  For concerns and questions about how to clean the PAP machine and the supplies and how frequently to change the hose, mask and filters, etc., patient can call the DME company, for more information, education and troubleshooting. Especially in the current situation, I recommend, patients be extra mindful about hand hygiene, handling the PAP equipment only with clean hands, wipe the mask daily, keep little one and four-legged companions (and any other pets  for that matter) away from the machine and mask at all times.    Thanks,   Star Age, MD, PhD [facility])

## 2018-10-20 ENCOUNTER — Ambulatory Visit: Payer: Self-pay | Admitting: Neurology

## 2018-11-15 ENCOUNTER — Other Ambulatory Visit: Payer: Self-pay

## 2018-11-15 ENCOUNTER — Other Ambulatory Visit: Payer: Medicare Other | Admitting: Internal Medicine

## 2018-11-15 DIAGNOSIS — F329 Major depressive disorder, single episode, unspecified: Secondary | ICD-10-CM

## 2018-11-15 DIAGNOSIS — E7800 Pure hypercholesterolemia, unspecified: Secondary | ICD-10-CM

## 2018-11-15 DIAGNOSIS — R011 Cardiac murmur, unspecified: Secondary | ICD-10-CM

## 2018-11-15 DIAGNOSIS — F419 Anxiety disorder, unspecified: Secondary | ICD-10-CM

## 2018-11-15 DIAGNOSIS — E2839 Other primary ovarian failure: Secondary | ICD-10-CM

## 2018-11-15 DIAGNOSIS — I10 Essential (primary) hypertension: Secondary | ICD-10-CM | POA: Diagnosis not present

## 2018-11-15 DIAGNOSIS — E559 Vitamin D deficiency, unspecified: Secondary | ICD-10-CM

## 2018-11-15 DIAGNOSIS — R7302 Impaired glucose tolerance (oral): Secondary | ICD-10-CM | POA: Diagnosis not present

## 2018-11-15 DIAGNOSIS — Z0000 Encounter for general adult medical examination without abnormal findings: Secondary | ICD-10-CM | POA: Diagnosis not present

## 2018-11-16 LAB — COMPLETE METABOLIC PANEL WITH GFR
AG Ratio: 0.9 (calc) — ABNORMAL LOW (ref 1.0–2.5)
ALT: 17 U/L (ref 6–29)
AST: 24 U/L (ref 10–35)
Albumin: 3.6 g/dL (ref 3.6–5.1)
Alkaline phosphatase (APISO): 99 U/L (ref 37–153)
BUN: 14 mg/dL (ref 7–25)
CO2: 26 mmol/L (ref 20–32)
Calcium: 9.6 mg/dL (ref 8.6–10.4)
Chloride: 105 mmol/L (ref 98–110)
Creat: 0.72 mg/dL (ref 0.50–0.99)
GFR, Est African American: 100 mL/min/{1.73_m2} (ref >=60)
GFR, Est Non African American: 87 mL/min/{1.73_m2} (ref >=60)
Globulin: 3.9 g/dL (calc) — ABNORMAL HIGH (ref 1.9–3.7)
Glucose, Bld: 92 mg/dL (ref 65–99)
Potassium: 3.8 mmol/L (ref 3.5–5.3)
Sodium: 138 mmol/L (ref 135–146)
Total Bilirubin: 0.7 mg/dL (ref 0.2–1.2)
Total Protein: 7.5 g/dL (ref 6.1–8.1)

## 2018-11-16 LAB — CBC WITH DIFFERENTIAL/PLATELET
Absolute Monocytes: 525 cells/uL (ref 200–950)
Basophils Absolute: 41 cells/uL (ref 0–200)
Basophils Relative: 0.5 %
Eosinophils Absolute: 180 cells/uL (ref 15–500)
Eosinophils Relative: 2.2 %
HCT: 37.7 % (ref 35.0–45.0)
Hemoglobin: 12.6 g/dL (ref 11.7–15.5)
Lymphs Abs: 2673 cells/uL (ref 850–3900)
MCH: 29.6 pg (ref 27.0–33.0)
MCHC: 33.4 g/dL (ref 32.0–36.0)
MCV: 88.7 fL (ref 80.0–100.0)
MPV: 11.2 fL (ref 7.5–12.5)
Monocytes Relative: 6.4 %
Neutro Abs: 4781 cells/uL (ref 1500–7800)
Neutrophils Relative %: 58.3 %
Platelets: 351 10*3/uL (ref 140–400)
RBC: 4.25 10*6/uL (ref 3.80–5.10)
RDW: 12.6 % (ref 11.0–15.0)
Total Lymphocyte: 32.6 %
WBC: 8.2 10*3/uL (ref 3.8–10.8)

## 2018-11-16 LAB — TSH: TSH: 4.96 mIU/L — ABNORMAL HIGH (ref 0.40–4.50)

## 2018-11-16 LAB — HEMOGLOBIN A1C
Hgb A1c MFr Bld: 5.5 % of total Hgb (ref <5.7)
Mean Plasma Glucose: 111 (calc)
eAG (mmol/L): 6.2 (calc)

## 2018-11-16 LAB — LIPID PANEL
Cholesterol: 245 mg/dL — ABNORMAL HIGH (ref <200)
HDL: 54 mg/dL (ref >=50)
LDL Cholesterol (Calc): 174 mg/dL (calc) — ABNORMAL HIGH
Non-HDL Cholesterol (Calc): 191 mg/dL (calc) — ABNORMAL HIGH (ref <130)
Total CHOL/HDL Ratio: 4.5 (calc) (ref <5.0)
Triglycerides: 72 mg/dL (ref <150)

## 2018-11-18 ENCOUNTER — Other Ambulatory Visit: Payer: Self-pay

## 2018-11-18 ENCOUNTER — Encounter: Payer: Medicare Other | Admitting: Internal Medicine

## 2018-11-18 ENCOUNTER — Ambulatory Visit (INDEPENDENT_AMBULATORY_CARE_PROVIDER_SITE_OTHER): Payer: Medicare Other | Admitting: Internal Medicine

## 2018-11-18 ENCOUNTER — Encounter: Payer: Self-pay | Admitting: Internal Medicine

## 2018-11-18 VITALS — BP 140/80 | HR 64 | Temp 98.3°F | Ht 62.0 in | Wt 194.0 lb

## 2018-11-18 DIAGNOSIS — Z0000 Encounter for general adult medical examination without abnormal findings: Secondary | ICD-10-CM

## 2018-11-18 DIAGNOSIS — I10 Essential (primary) hypertension: Secondary | ICD-10-CM | POA: Diagnosis not present

## 2018-11-18 DIAGNOSIS — Z6835 Body mass index (BMI) 35.0-35.9, adult: Secondary | ICD-10-CM

## 2018-11-18 DIAGNOSIS — E7800 Pure hypercholesterolemia, unspecified: Secondary | ICD-10-CM | POA: Diagnosis not present

## 2018-11-18 DIAGNOSIS — Z23 Encounter for immunization: Secondary | ICD-10-CM | POA: Diagnosis not present

## 2018-11-18 DIAGNOSIS — G4733 Obstructive sleep apnea (adult) (pediatric): Secondary | ICD-10-CM | POA: Diagnosis not present

## 2018-11-18 DIAGNOSIS — R011 Cardiac murmur, unspecified: Secondary | ICD-10-CM | POA: Diagnosis not present

## 2018-11-18 DIAGNOSIS — E039 Hypothyroidism, unspecified: Secondary | ICD-10-CM

## 2018-11-18 DIAGNOSIS — R7302 Impaired glucose tolerance (oral): Secondary | ICD-10-CM

## 2018-11-18 DIAGNOSIS — F411 Generalized anxiety disorder: Secondary | ICD-10-CM

## 2018-11-18 DIAGNOSIS — Z8659 Personal history of other mental and behavioral disorders: Secondary | ICD-10-CM | POA: Diagnosis not present

## 2018-11-18 LAB — POCT URINALYSIS DIPSTICK
Appearance: NEGATIVE
Bilirubin, UA: NEGATIVE
Blood, UA: NEGATIVE
Glucose, UA: NEGATIVE
Ketones, UA: NEGATIVE
Leukocytes, UA: NEGATIVE
Nitrite, UA: NEGATIVE
Odor: NEGATIVE
Protein, UA: NEGATIVE
Spec Grav, UA: 1.015 (ref 1.010–1.025)
Urobilinogen, UA: 0.2 E.U./dL
pH, UA: 6 (ref 5.0–8.0)

## 2018-11-18 MED ORDER — ROSUVASTATIN CALCIUM 5 MG PO TABS: 5.0000 mg | ORAL_TABLET | Freq: Every day | ORAL | 3 refills | Status: DC

## 2018-11-18 MED ORDER — LEVOTHYROXINE SODIUM 50 MCG PO TABS: 50.0000 ug | ORAL_TABLET | Freq: Every day | ORAL | 3 refills | Status: DC

## 2018-11-18 MED ORDER — SERTRALINE HCL 100 MG PO TABS: 100.0000 mg | ORAL_TABLET | Freq: Every day | ORAL | 3 refills | Status: DC

## 2018-11-18 NOTE — Patient Instructions (Signed)
Pneumococcal 23 given today.  Take Crestor 5 mg daily with supper.  Start levothyroxine 0.05 mg daily and follow-up in 6 weeks with office visit lipid panel liver functions and TSH.

## 2018-11-18 NOTE — Progress Notes (Signed)
Subjective:    Patient ID: [name], female    DOB: 07/18/1950, 68 y.o.   MRN: [id number]  HPI  68 year old Female for health maintenance exam, Medicare wellness and evaluation of medical issues.  She says she is feeling better than she has in some time.  At one point we were concerned about her memory but that seems to have improved.  Has been diagnosed recently with moderate obstructive sleep apnea.  Is also seen neurologist for memory loss in February 2020.  Was diagnosed with mild cognitive impairment by Dr. Valentina Shaggy in December 2016.  No history and family of dementia.  Was started on Zoloft around that time for depression.  Has been evaluated by a neurologist this year who performed an MRI of the brain with and without contrast showing no acute findings or abnormal lesions.  Mild periventricular and subcortical foci of nonspecific gliosis.  History of bilateral TKAs  History of impaired glucose tolerance, hypertension and hyperlipidemia.  Social history: Married.  His twin daughters and 1 son.  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  Non-smoker.  Rarely drinks alcohol.  Son has had a kidney transplant.  Husband retired from [employer] where he worked as an Chief Financial Officer.  She had colonoscopy in 08-28-2009.  Dr. Donneta Romberg did allergy testing in 08-28-2005 which was negative.  She saw ENT physician at that time for chronic sinusitis and was thought to have GE reflux.  CT of the sinuses was normal.  History in the remote past of iron deficiency anemia related to menorrhagia that resolved with menopause.  Ganglion cyst removed from left hand 1999, bilateral tubal ligation 1983.  Thrombosed hemorrhoid excised by Dr. Dalbert Batman in 08/29/03.  Family history: Father with history of hypertension died of renal failure and congestive heart failure.  He also had a history of subdural hematoma.  He was 67 years old when he died.  Mother died at age 49 of diabetes and hypertension.  One brother  with history of hypertension on dialysis.  2 sisters 1 of whom is living with history of colon cancer.  Younger sister died in Aug 29, 1994 of a cerebral hemorrhage.  Had bilateral knee replacement surgery February and in December 2010.  Both surgeries were done by Dr. Durward Fortes.    Review of Systems see above     Objective:   Physical Exam Blood pressure 140/80, BMI 35.48, pulse 64, temperature 98.3 degrees orally weight 194 pounds height 5 feet 2 inches pulse oximetry 98%.  Skin warm and dry.  Nodes none.  PERRLA.  Extraocular movements are full.  TMs and pharynx are clear.  Neck is supple without JVD thyromegaly or carotid bruits.  Chest is clear to auscultation without rales or wheezing.  Cardiac exam 1/6 systolic ejection murmur consistent with innocent cardiac murmur.  Abdomen obese soft nondistended without hepatosplenomegaly masses or tenderness.  Bimanual exam is normal.  Pap deferred due to age.  Extremities without edema.  Neuro no gross focal deficits on brief neurological exam.       Assessment & Plan:     Pure hypercholesterolemia-total cholesterol 245 and LDL cholesterol 174.  Patient will try Crestor 5 mg daily and follow-up in September.  Essential hypertension-stable on current regimen.  Reports blood pressure readings at home are better than here at the office.  Currently on Maxide 25 and lisinopril.  Amlodipine can be added if blood pressure remains elevated or lisinopril can be increased or changed to another  drug.  Impaired glucose tolerance- stable at 5.5% with diet alone  BMI 35.4-needs to work on diet exercise and weight loss  Primary hypothyroidism-her TSH is elevated on recent lab work and she will be started on levothyroxine 0.05 mg daily with follow-up in 6 weeks  History of anxiety treated with Xanax twice daily as needed  History of mild depression treated with Zoloft 100 mg daily  History of mild cognitive impairment followed by neurologist  History of  innocent cardiac murmur-stable and asymptomatic  Plan: Follow-up in September with lipid panel liver functions and TSH with office visit.  Subjective:   Patient presents for Medicare Annual/Subsequent preventive examination.  Review Past Medical/Family/Social: See above   Risk Factors  Current exercise habits: Not going to the gym since the jams are closed Dietary issues discussed: Low-fat low carbohydrate  Cardiac risk factors: Hyperlipidemia, hypertension, family history and father died of congestive heart failure, obesity  Depression Screen  (Note: if answer to either of the following is "Yes", a more complete depression screening is indicated)   Over the past two weeks, have you felt down, depressed or hopeless? No  Over the past two weeks, have you felt little interest or pleasure in doing things? No Have you lost interest or pleasure in daily life? No Do you often feel hopeless? No Do you cry easily over simple problems? No   Activities of Daily Living  In your present state of health, do you have any difficulty performing the following activities?:   Driving? No  Managing money? No  Feeding yourself? No  Getting from bed to chair? No  Climbing a flight of stairs? No  Preparing food and eating?: No  Bathing or showering? No  Getting dressed: No  Getting to the toilet? No  Using the toilet:No  Moving around from place to place: No  In the past year have you fallen or had a near fall?:No  Are you sexually active?  Yes Do you have more than one partner? No   Hearing Difficulties: No  Do you often ask people to speak up or repeat themselves? No  Do you experience ringing or noises in your ears?  Yes Do you have difficulty understanding soft or whispered voices? No  Do you feel that you have a problem with memory? No Do you often misplace items? No    Home Safety:  Do you have a smoke alarm at your residence? Yes Do you have grab bars in the bathroom?  None  do  you have throw rugs in your house?  Yes   Cognitive Testing  Alert? Yes Normal Appearance?Yes  Oriented to person? Yes Place? Yes  Time? Yes  Recall of three objects? Yes  Can perform simple calculations? Yes  Displays appropriate judgment?Yes  Can read the correct time from a watch face?Yes   List the Names of Other Physician/Practitioners you currently use:  See referral list for the physicians patient is currently seeing.  Neurologist   Review of Systems: See above   Objective:     General appearance: Appears stated age and  obese  Head: Normocephalic, without obvious abnormality, atraumatic  Eyes: conj clear, EOMi PEERLA  Ears: normal TM's and external ear canals both ears  Nose: Nares normal. Septum midline. Mucosa normal. No drainage or sinus tenderness.  Throat: lips, mucosa, and tongue normal; teeth and gums normal  Neck: no adenopathy, no carotid bruit, no JVD, supple, symmetrical, trachea midline and thyroid not enlarged, symmetric, no tenderness/mass/nodules  No CVA tenderness.  Lungs: clear to auscultation bilaterally  Breasts: normal appearance, no masses or tenderness Heart: regular rate and rhythm, S1, S2 normal, 1/6 systolic murmur, without click, rub or gallop  Abdomen: soft, non-tender; bowel sounds normal; no masses, no organomegaly  Musculoskeletal: ROM normal in all joints, no crepitus, no deformity, Normal muscle strengthen. Back  is symmetric, no curvature. Skin: Skin color, texture, turgor normal. No rashes or lesions  Lymph nodes: Cervical, supraclavicular, and axillary nodes normal.  Neurologic: CN 2 -12 Normal, Normal symmetric reflexes. Normal coordination and gait  Psych: Alert & Oriented x 3, Mood appear stable.    Assessment:    Annual wellness medicare exam   Plan:    During the course of the visit the patient was educated and counseled about appropriate screening and preventive services including:   Annual mammogram  Annual flu  vaccine  Pneumococcal 23 vaccine given  Tetanus immunization is up-to-date     Patient Instructions (the written plan) was given to the patient.  Medicare Attestation  I have personally reviewed:  The patient's medical and social history  Their use of alcohol, tobacco or illicit drugs  Their current medications and supplements  The patient's functional ability including ADLs,fall risks, home safety risks, cognitive, and hearing and visual impairment  Diet and physical activities  Evidence for depression or mood disorders  The patient's weight, height, BMI, and visual acuity have been recorded in the chart. I have made referrals, counseling, and provided education to the patient based on review of the above and I have provided the patient with a written personalized care plan for preventive services.

## 2018-11-29 ENCOUNTER — Other Ambulatory Visit: Payer: Self-pay | Admitting: Internal Medicine

## 2018-12-22 ENCOUNTER — Other Ambulatory Visit: Payer: Self-pay

## 2018-12-22 ENCOUNTER — Other Ambulatory Visit (INDEPENDENT_AMBULATORY_CARE_PROVIDER_SITE_OTHER): Payer: Medicare Other | Admitting: Internal Medicine

## 2018-12-22 ENCOUNTER — Other Ambulatory Visit: Payer: Medicare Other | Admitting: Internal Medicine

## 2018-12-22 DIAGNOSIS — E039 Hypothyroidism, unspecified: Secondary | ICD-10-CM

## 2018-12-22 DIAGNOSIS — E7800 Pure hypercholesterolemia, unspecified: Secondary | ICD-10-CM

## 2018-12-22 DIAGNOSIS — I10 Essential (primary) hypertension: Secondary | ICD-10-CM

## 2018-12-22 DIAGNOSIS — R7302 Impaired glucose tolerance (oral): Secondary | ICD-10-CM

## 2018-12-22 NOTE — Patient Instructions (Signed)
Fasting labs drawn. Pt chewing gum.

## 2018-12-22 NOTE — Progress Notes (Signed)
Patient presented for fasting labs drawn by CMA

## 2018-12-23 ENCOUNTER — Other Ambulatory Visit: Payer: Medicare Other | Admitting: Internal Medicine

## 2018-12-23 LAB — HEPATIC FUNCTION PANEL
AG Ratio: 1.2 (calc) (ref 1.0–2.5)
ALT: 19 U/L (ref 6–29)
AST: 19 U/L (ref 10–35)
Albumin: 4.1 g/dL (ref 3.6–5.1)
Alkaline phosphatase (APISO): 97 U/L (ref 37–153)
Bilirubin, Direct: 0.1 mg/dL (ref 0.0–0.2)
Globulin: 3.5 g/dL (calc) (ref 1.9–3.7)
Indirect Bilirubin: 0.6 mg/dL (calc) (ref 0.2–1.2)
Total Bilirubin: 0.7 mg/dL (ref 0.2–1.2)
Total Protein: 7.6 g/dL (ref 6.1–8.1)

## 2018-12-23 LAB — LIPID PANEL
Cholesterol: 197 mg/dL (ref <200)
HDL: 54 mg/dL (ref >=50)
LDL Cholesterol (Calc): 124 mg/dL (calc) — ABNORMAL HIGH
Non-HDL Cholesterol (Calc): 143 mg/dL (calc) — ABNORMAL HIGH (ref <130)
Total CHOL/HDL Ratio: 3.6 (calc) (ref <5.0)
Triglycerides: 86 mg/dL (ref <150)

## 2018-12-23 LAB — TSH: TSH: 3.88 mIU/L (ref 0.40–4.50)

## 2018-12-28 ENCOUNTER — Encounter: Payer: Self-pay | Admitting: Neurology

## 2018-12-30 ENCOUNTER — Ambulatory Visit (INDEPENDENT_AMBULATORY_CARE_PROVIDER_SITE_OTHER): Payer: Medicare Other | Admitting: Neurology

## 2018-12-30 ENCOUNTER — Encounter: Payer: Self-pay | Admitting: Neurology

## 2018-12-30 ENCOUNTER — Ambulatory Visit: Payer: Medicare Other | Admitting: Internal Medicine

## 2018-12-30 ENCOUNTER — Other Ambulatory Visit: Payer: Self-pay

## 2018-12-30 VITALS — BP 180/84 | HR 53 | Ht 62.0 in | Wt 194.0 lb

## 2018-12-30 DIAGNOSIS — G4733 Obstructive sleep apnea (adult) (pediatric): Secondary | ICD-10-CM | POA: Diagnosis not present

## 2018-12-30 DIAGNOSIS — F419 Anxiety disorder, unspecified: Secondary | ICD-10-CM | POA: Diagnosis not present

## 2018-12-30 DIAGNOSIS — R413 Other amnesia: Secondary | ICD-10-CM

## 2018-12-30 DIAGNOSIS — Z9989 Dependence on other enabling machines and devices: Secondary | ICD-10-CM

## 2018-12-30 NOTE — Progress Notes (Signed)
Subjective:    Patient ID: [name] is a 68 y.o. female.  HPI     Interim history:   [name] is a 68 year old right-handed woman with an underlying medical history of hypertention, hyperlipidemia, anemia, prediabetes, sleep apnea, and obesity, who presents for follow-up consultation of Brianna memory loss, and for follow-up of Brianna obstructive sleep apnea after recent sleep study testing.  The patient is unaccompanied today.  I first met Brianna on 05/31/2018 at the request of Brianna primary care physician, Dr. Renold Genta, at which time the patient reported a prior diagnosis of mild cognitive impairment some for 5 years ago.  The patient reported forgetfulness and also stress as well as residual depression.  Brianna MMSE was 20 out of 30 at the time.  She was encouraged to seek optimization of Brianna depressive symptoms and follow-up with primary care.  In the interim, I also suggested we proceed with neuropsychological referral for cognitive testing, sleep study to rule out obstructive sleep apnea and a brain MRI.  She had a brain MRI with and without contrast on 06/30/2018 and I reviewed the results: IMPRESSION:    MRI brain (with and without) demonstrating: - Mild periventricular and subcortical foci of non-specific gliosis.  - No abnormal lesions are seen on post contrast views.   - No acute findings.   Brianna split-night sleep study was canceled in March 2020 due to the COVID-19 pandemic.  She was referred to neuropsychology, Dr. Sima Matas, but has not had Brianna evaluation yet.  She had a home sleep test on 10/07/2018 which indicated moderate obstructive sleep apnea with an AHI of 24.4/h, and O2 nadir of 82%.  Given the COVID-19 pandemic she was advised to proceed with AutoPap therapy.  Today, 12/30/2018: I reviewed Brianna AutoPap compliance data from 11/29/2018 through 12/28/2018 which is a total of 30 days, during which time she used Brianna machine 18 days only, with percent use days greater than 4 hours at 17% only,  indicating significantly low compliance with an average usage of 3 hours and 14 minutes only, residual AHI elevated at 12.2/h, 95th percentile of pressure at 10.7 cm, leak very high with a 95th percentile at 47.7 L/min, pressure range of 7 cm to 13 cm with EPR.  Brianna set up date was 11/01/2018, Brianna percent use days greater than 4 hours for the past nearly 60 days was about 31%.  Residual AHI for all days about 7.8/h, average pressure for the 95th percentile at 9.8 cm, but leak has been consistently high. She reports feeling stable with regards to Brianna memory, [name] agrees.  She does have good days and bad days and it seems to correlate with Brianna level of anxiety has noted.  She takes Xanax 1 pill daily.  She is also on sertraline.  She feels like the AutoPap is going well but he admits that she skips a few nights here and there.  He has to encourage Brianna to use it.  He also tries to encourage Brianna to drink more water as she has a tendency to drink less.  She seems to have a cup of water next to Brianna or close to Brianna most of the time but does not seem to drink from it very much.  She denies any recent falls.  She tries to walk the dog but could do better with Brianna exercise she admits.  He reports that they both need to walk a little more and are motivated to walk a mile a  day, weather permitting. She is scheduled for consultation with Dr. Sima Matas on 01/13/2019.  For the AutoPap she uses a nasal cradle interface from his description.  He has noted air leaking from the mouth and she also makes snoring sounds through it.  The patient's allergies, current medications, family history, past medical history, past social history, past surgical history and problem list were reviewed and updated as appropriate.   Previously:  05/31/2018: She reports increased forgetfulness. [name] reports that she was diagnosed with mild congnitive impairment some 4-5 years ago. There was no FU testing or treatment at the time. Brianna  Khan reports that she has been more forgetful, repetitive, asking the same questions over and over again. Brianna driving seems to be good. She has not gotten lost. They take turns with a drive up to [place].  She endorses stress and attributes some of Brianna forgetfulness to stress.  She reported sleeping fairly well. Sometimes she goes to bed late and sleeps in late. She does snore per Khan's report. Brianna Epworth sleepiness score is 6 out of 24 today. I reviewed your office note from 05/25/2018. She had blood work through your office at the time which I reviewed. She had B12, folate, CBC with differential, CMP, ESR, RPR, TSH, free T4. B12 was elevated above 1000, folate normal, platelets slightly elevated at 410 CMP showed slightly elevated calcium and total protein, ESR mildly elevated at 65, RPR nonreactive, TSH and free T4 normal. She was advised to return for additional laboratory testing including parathyroid hormone and calcium.  She had a sleep study 2008 and was diagnosed with sleep apnea. She has not been on CPAP therapy. She had seen Dr. Valentina Shaggy in December 2016 on 03/22/2015 and 03/30/2015 and had neuropsychological evaluation. She was diagnosed with mild cognitive impairment in the setting of depression. He recommended that she be restarted on antidepressant medication and consider counseling. She reports taking Brianna pills regularly, but [name] is not so sure. She had b/l TKAs. She has no FHx of dementia. 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 Has 3 children, twin daughters in [place] and son in [place]. She admits that since Brianna retirement she has become more withdrawn. She also adds that she is content being by herself. She has residual depression, and was supposed to increase the Zoloft. [facility]. She is a nonsmoker and drinks alcohol rarely, no daily caffeine, does drink occasional tea. She does not always hydrate well enough she admits, does not  typically get 8 cups of water per day.  Brianna Past Medical History Is Significant For: Past Medical History:  Diagnosis Date  . Anemia   . Hyperlipidemia   . Hypertension   . Pre-diabetes   . Sleep apnea     Brianna Past Surgical History Is Significant For: Past Surgical History:  Procedure Laterality Date  . GANGLION CYST EXCISION     l hand  . JOINT REPLACEMENT     knee right  . TUBAL LIGATION      Brianna Family History Is Significant For: Family History  Problem Relation Age of Onset  . Diabetes Mother   . Hypertension Mother   . Kidney disease Father   . Heart disease Father   . Stroke Sister     Brianna Social History Is Significant For: Social History   Socioeconomic History  . Marital status: Married    Spouse name: Not on file  . Number of  children: Not on file  . Years of education: Not on file  . Highest education level: Not on file  Occupational History  . Not on file  Social Needs  . Financial resource strain: Not on file  . Food insecurity    Worry: Not on file    Inability: Not on file  . Transportation needs    Medical: Not on file    Non-medical: Not on file  Tobacco Use  . Smoking status: Never Smoker  . Smokeless tobacco: Never Used  Substance and Sexual Activity  . Alcohol use: Yes    Comment: rarely  . Drug use: No  . Sexual activity: Not on file  Lifestyle  . Physical activity    Days per week: Not on file    Minutes per session: Not on file  . Stress: Not on file  Relationships  . Social Herbalist on phone: Not on file    Gets together: Not on file    Attends religious service: Not on file    Active member of club or organization: Not on file    Attends meetings of clubs or organizations: Not on file    Relationship status: Not on file  Other Topics Concern  . Not on file  Social History Narrative  . Not on file    Brianna Allergies Are:  No Known Allergies:   Brianna Current Medications Are:  Outpatient Encounter  Medications as of 12/30/2018  Medication Sig  . ALPRAZolam (XANAX) 0.5 MG tablet TAKE ONE TABLET BY MOUTH TWICE A DAY AS NEEDED FOR ANXIETY  . Cholecalciferol (VITAMIN D) 1000 UNITS capsule Take 1,000 Units by mouth daily.    . cyanocobalamin 500 MCG tablet Take 500 mcg by mouth daily.  . ferrous sulfate 325 (65 FE) MG EC tablet Take 325 mg by mouth daily.   Marland Kitchen levothyroxine (SYNTHROID) 50 MCG tablet Take 1 tablet (50 mcg total) by mouth daily.  Marland Kitchen lisinopril (ZESTRIL) 10 MG tablet TAKE ONE TABLET BY MOUTH DAILY  . Multiple Vitamins-Minerals (MULTIVITAMIN WITH MINERALS) tablet Take 1 tablet by mouth daily.    . rosuvastatin (CRESTOR) 5 MG tablet Take 1 tablet (5 mg total) by mouth daily.  . sertraline (ZOLOFT) 100 MG tablet Take 1 tablet (100 mg total) by mouth daily.  Marland Kitchen triamterene-hydrochlorothiazide (MAXZIDE-25) 37.5-25 MG tablet TAKE ONE TABLET BY MOUTH DAILY  . vitamin E (VITAMIN E) 400 UNIT capsule Take 400 Units by mouth daily.   No facility-administered encounter medications on file as of 12/30/2018.   :  Review of Systems:  Out of a complete 14 point review of systems, all are reviewed and negative with the exception of these symptoms as listed below: Review of Systems  Neurological:       Pt presents today to discuss Brianna memory and auto pap. Pt reports that she enjoys Brianna auto pap. Pt reports that Brianna memory is stable.    Objective:  Neurological Exam  Physical Exam Physical Examination:   Vitals:   12/30/18 0930  BP: (!) 180/84  Pulse: (!) 53   General Examination: The patient is a very pleasant 68 y.o. female in no acute distress. She appears well-developed and well-nourished and well groomed.   HEENT: Normocephalic, atraumatic, pupils are equal, round and reactive to light and accommodation. Extraocular tracking is good without limitation to gaze excursion or nystagmus noted. Normal smooth pursuit is noted. Hearing is grossly intact. Face is symmetric with normal facial  animation and  normal facial sensation. Speech is clear with no dysarthria noted. There is no hypophonia. There is no lip, neck/head, jaw or voice tremor. Neck shows FROM. There are no carotid bruits on auscultation. Oropharynx exam reveals: mild to moderate mouth dryness, good dental hygiene and mild airway crowding. Tongue protrudes centrally and palate elevates symmetrically.   Chest: Clear to auscultation without wheezing, rhonchi or crackles noted.  Heart: S1+S2+0, regular and normal without murmurs, rubs or gallops noted.   Abdomen: Soft, non-tender and non-distended with normal bowel sounds appreciated on auscultation.  Extremities: There is no pitting edema in the distal lower extremities bilaterally.   Skin: Warm and dry without trophic changes noted.  Musculoskeletal: exam reveals no obvious joint deformities, tenderness or joint swelling or erythema.   Neurologically:  Mental status: The patient is awake, alert and oriented in all 4 spheres. Brianna immediate and remote memory, attention, language skills and fund of knowledge are appropriate. There is no evidence of aphasia, agnosia, apraxia or anomia. Speech is clear with normal prosody and enunciation. Thought process is linear. Mood is slightly better, and affect somewhat blunted.  Cranial nerves II - XII are as described above under HEENT exam. In addition: shoulder shrug is normal with equal shoulder height noted. Motor exam: Normal bulk, strength and tone is noted. There is no tremor.   On 05/31/2018: MMSE: 20/30 but variable effort noted, pt reported feeling stressed, CDT: 4/4, AFP: 10/min.  On 12/30/2018: MMSE: 19/30, CDT: 4/4, AFT 12/min. Better effort noted by nurse today.  Reflexes are 1+ throughout. Fine motor skills and coordination: grossly intact. Cerebellar testing: No dysmetria or intention tremor. There is no truncal or gait ataxia.  Sensory exam: intact to light touch in the upper and lower extremities.  Gait,  station and balance: She stands easily. No veering to one side is noted. No leaning to one side is noted. Posture is age-appropriate and stance is narrow based. Gait shows normal stride length and normal pace. No problems turning are noted.  Assessment and Plan:   In summary, VOULA WALN is a 68 year old female with an underlying medical history of hypertention, hyperlipidemia, anemia, prediabetes, sleep apnea, and obesity, who presents for Follow-up consultation of Brianna memory loss and sleep apnea.  She had interim testing.  Brain MRI from March 2020 showed mild vascular/white matter changes, no obvious atrophy, no acute findings thankfully.  She had a home sleep test in June 2020 which indicated moderate obstructive sleep apnea.  She has been on AutoPap therapy since mid July.  She is struggling with consistency in Brianna usage.  She is motivated to continue.  We talked about the importance of compliance and the importance of treating moderate sleep apnea with regards to prevention of chronic vascular complications including cardiovascular complications as well as cerebrovascular complications.  In particular, for Brianna memory and cognitive complaints, it is imperative that she be treated for sleep apnea as well.  We talked about the importance of maintaining a healthy lifestyle and also the importance of addressing anxiety and depression optimally.She still has some residual anxiety, this is confirmed by [name] as well.  She is motivated to increase Brianna water intake and Brianna exercise level.  We talked about the insurance criteria for compliance which have to be fulfilled within the first 90 days most typically.  We still are about a month away from that and we have room for improvement.  We also have room for improvement for Brianna mask seal and she  is advised to make an appointment with Brianna DME company for a mask refit.  Hopefully Brianna residual AHI will come down once the seal is better.  We may have to tweak  Brianna pressure accordingly as well.  I suggested we reconvene in about 3 months, sooner if needed.  I answered all the questions today and the patient and [name] were in agreement, she is reminded to keep Brianna appointment later this month with Dr. Jefm Miles.  I spent 25 minutes in total face-to-face time with the patient, more than 50% of which was spent in counseling and coordination of care, reviewing test results, reviewing medication and discussing or reviewing the diagnosis of memory loss, OSA, the prognosis and treatment options. Pertinent laboratory and imaging test results that were available during this visit with the patient were reviewed by me and considered in my medical decision making (see chart for details).

## 2018-12-30 NOTE — Progress Notes (Signed)
Left message letting her know of Dr. Verlene Mayer instructions, patient needs to call back to schedule an appointment.

## 2018-12-30 NOTE — Patient Instructions (Signed)
I appreciate you using your AutoPap therapy, please try to increase your usage and try not to skip any nights.  You would benefit from a mask refit as your air is leaking, probably through the mouth since you have a nasal cushion type of mask.Please call [employer], your DME company for a mask change and refit.  The address for [employer] is: [address] [phone].  Please continue using your autoPAP regularly. While your insurance requires that you use PAP at least 4 hours each night on 70% of the nights, I recommend, that you not skip any nights and use it throughout the night if you can. Getting used to PAP and staying with the treatment long term does take time and patience and discipline. Untreated obstructive sleep apnea when it is moderate to severe can have an adverse impact on cardiovascular health and raise her risk for heart disease, arrhythmias, hypertension, congestive heart failure, stroke and diabetes. Untreated obstructive sleep apnea causes sleep disruption, nonrestorative sleep, and sleep deprivation. This can have an impact on your day to day functioning and cause daytime sleepiness and impairment of cognitive function, memory loss, mood disturbance, and problems focussing. Using PAP regularly can improve these symptoms.  You are not quite there with your compliance in terms of using your AutoPap consistently more than 4 hours.  We do have some time to fulfill the compliance requirement in the next month.  You will also likely reap benefit from treating your sleep apnea when you are using it more consistently.  You have moderate obstructive sleep apnea and it would benefit from treatment and can also help with memory function.  Please keep your appointment with Dr. Sima Matas later this month.  We will reevaluate things in 3 months.   Please increase your water intake, 6 to 8 cups of water per day are recommended and try to exercise in the form of walking daily, within the  recommendations of social distancing as well as weather permitting.

## 2018-12-31 ENCOUNTER — Telehealth: Payer: Self-pay | Admitting: Internal Medicine

## 2018-12-31 NOTE — Telephone Encounter (Signed)
Called to let patient know when her appointment was rescheduled for, she was not up yet, so I spoke with husband and he let me know that the day we scheduled they were going to be out of town. So we rescheduled for next day. While talking with him I let him know that she needs to be recording her blood pressure ever day and bring to office visit on the 01/11/19. He stated that she was being forgetful and he does not believe she is taking her medication right. So he is going to put in the daily pill containers and keep closer eye on it. He will also come to next appointment and help her keep the blood pressure log by taking at least twice a day and writing down the times it was taken.

## 2019-01-10 ENCOUNTER — Ambulatory Visit: Payer: Medicare Other | Admitting: Internal Medicine

## 2019-01-11 ENCOUNTER — Other Ambulatory Visit: Payer: Self-pay

## 2019-01-11 ENCOUNTER — Encounter: Payer: Self-pay | Admitting: Internal Medicine

## 2019-01-11 ENCOUNTER — Ambulatory Visit (INDEPENDENT_AMBULATORY_CARE_PROVIDER_SITE_OTHER): Payer: Medicare Other | Admitting: Internal Medicine

## 2019-01-11 VITALS — BP 128/70 | HR 63 | Temp 98.1°F | Ht 62.0 in | Wt 195.0 lb

## 2019-01-11 DIAGNOSIS — Z0000 Encounter for general adult medical examination without abnormal findings: Secondary | ICD-10-CM | POA: Diagnosis not present

## 2019-01-11 DIAGNOSIS — Z1231 Encounter for screening mammogram for malignant neoplasm of breast: Secondary | ICD-10-CM

## 2019-01-11 MED ORDER — LISINOPRIL 10 MG PO TABS: 10.0000 mg | ORAL_TABLET | Freq: Every day | ORAL | 0 refills | Status: DC

## 2019-01-11 MED ORDER — TRIAMTERENE-HCTZ 37.5-25 MG PO TABS: 1.0000 | ORAL_TABLET | Freq: Every day | ORAL | 1 refills | Status: DC

## 2019-01-11 NOTE — Patient Instructions (Signed)
Continue current medications.  Mammogram appointment arranged.  Memory seems stable.  Follow-up in early March for 47-month recheck appointment.

## 2019-01-11 NOTE — Progress Notes (Signed)
   Subjective:    Patient ID: [name], female    DOB: 06/11/1950, 69 y.o.   MRN: [id number]  HPI  68 year old Female for follow up on hypertension. BP readings over the past several days are very acceptable 128/68, 138/68, 124/57 and 128/70.  Blood pressure today is 128/70 here in the office.  Weight 195 pounds.      Review of Systems history of mild memory loss exacerbated by anxiety.     Objective:   Physical Exam Neck is supple without JVD thyromegaly or carotid bruits.  Chest clear to auscultation.  Cardiac exam regular rate and rhythm 1/6 to 2/6 systolic ejection murmur heard along the left sternal border.  This is not a new murmur.  Extremities without edema.  She is alert and oriented x3.  Able to do serial sevens slowly and knows [name] is the president.  Oriented to day of week and year.       Assessment & Plan:  Essential hypertension-stable on current regimen of low-dose Zestril and Maxide 25  History of anxiety treated with Xanax which she takes twice daily if needed  Hypothyroidism with stable TSH on thyroid replacement medication  Mild memory loss-continue to follow-seems to be exacerbated with anxiety.  She is aware of it at times.  We will continue with Zoloft and as needed Xanax.  Continue to monitor.  She will follow-up in early March for 45-month recheck.  We will try to get through the winter before we see her again.  She had CPE in late July.  She knows she can call if she needs anything.  She declines flu vaccine.  Arrange for mammogram.  She is not had one recently.

## 2019-01-13 ENCOUNTER — Other Ambulatory Visit: Payer: Self-pay

## 2019-01-13 ENCOUNTER — Encounter: Payer: Medicare Other | Attending: Psychology | Admitting: Psychology

## 2019-01-13 ENCOUNTER — Encounter: Payer: Self-pay | Admitting: Psychology

## 2019-01-13 DIAGNOSIS — G4733 Obstructive sleep apnea (adult) (pediatric): Secondary | ICD-10-CM | POA: Insufficient documentation

## 2019-01-13 DIAGNOSIS — F418 Other specified anxiety disorders: Secondary | ICD-10-CM | POA: Diagnosis not present

## 2019-01-13 DIAGNOSIS — R413 Other amnesia: Secondary | ICD-10-CM | POA: Diagnosis not present

## 2019-01-13 NOTE — Progress Notes (Signed)
Neuropsychological Consultation   Patient:   [name]   DOB:   [birth year]  MR Number:  [id number]  [facility]  [facility] [address] [id number] [facility] [place] [id number] Dept: [phone]           Date of Service:   01/13/2019  Start Time:   3 PM End Time:   5 PM  Today's visit was an in person visit that consisted of myself along with the patient.  It was conducted in my outpatient clinic office.  1 hour of this time was spent in direct in person face-to-face clinical interview and the other hour consisted of medical records review and formal report writing.  Provider/Observer:  Arley PhenixJohn Rodenbough, Psy.D.       Clinical Neuropsychologist       Billing Code/Service: [phone], [phone]  Chief Complaint:    [name] is a 68 year old female referred by Dr. Frances FurbishAthar, who is her neurologist with [facility], for neuropsychological evaluation due to the patient reporting memory difficulties that seem to be progressing to some degree.  The patient is also followed by Luanna ColeMary J. Lenord FellersBaxley, MD, who is her PCP.  The patient reports that she was diagnosed with mild cognitive impairment approximately 5 years ago and has consistently reported difficulties with forgetfulness and memory issues as well as anxiety, stress and depressive symptoms.  The patient reports that her memory difficulties have slowly progressed over the past 5 years or so but does not describe a stepwise progression.  The patient denies any other types of particular cognitive difficulties.  The patient has had times where she did rather poorly on mental status exams at various assessments.  The patient is also been diagnosed with obstructive sleep apnea and at times have had significantly low compliance but other times where she is improving compliance.  She still has a way to go as far as improving her compliance with CPAP  machine.  Reason for Service:  [name] is a 68 year old female referred by Dr. Frances FurbishAthar, who is her neurologist with [facility], for neuropsychological evaluation due to the patient reporting memory difficulties that seem to be progressing to some degree.  The patient is also followed by Luanna ColeMary J. Lenord FellersBaxley, MD, who is her PCP.  The patient reports that she was diagnosed with mild cognitive impairment approximately 5 years ago and has consistently reported difficulties with forgetfulness and memory issues as well as anxiety, stress and depressive symptoms.  The patient reports that her memory difficulties have slowly progressed over the past 5 years or so but does not describe a stepwise progression.  The patient denies any other types of particular cognitive difficulties.  The patient has had times where she did rather poorly on mental status exams at various assessments.  The patient is also been diagnosed with obstructive sleep apnea and at times have had significantly low compliance but other times where she is improving compliance.  She still has a way to go as far as improving her compliance with CPAP machine.  While the patient reports some progression with her memory difficulties she describes it is generally stable over the past several years.  The patient describes good days and bad days and describes correlation between her memory difficulties and her level of anxiety.  The patient does take Xanax (0.5 mg up to twice per day.  The patient is also taking sertraline for her depression.  The  patient takes lisinopril at bedtime to facilitate with sleep.  The patient denies any other cognitive changes including expressive or receptive language changes, executive functioning/reasoning and problem changes or visual spatial/geographic disorientation type symptoms.  She relates to the only cognitive change that she identifies has to do with her memory.  The patient did have some questions about whether  relationship issues, anxiety or depression could play a role in her memory difficulties and is concerned that they may be playing a significant role as they are correlated.  The patient reports that she has had issues with anxiety for some time.  She reports that years ago her son was diagnosed with kidney disease and as her father had also been on dialysis this really concerned and worried her.  Her adult son has had a kidney transplant and at this point is doing quite well with no problems as far as his kidneys.  The patient reports that more recently that she feels more alone at times and does not feel like she gets a lot of support from her husband who spends a lot of time away from the home playing golf or with other activities during the day.  The patient has a medical history including hypertension, hyperlipidemia, sleep apnea, history of iron deficiency anemia, osteoarthritis, benign or innocent cardiac murmurs, anxiety, impaired glucose intolerance, metabolic syndrome and depression.  The patient had an MRI of her brain with and without contrast conducted on 06/30/2018.  The impressions of this MRI were of mild periventricular and subcortical foci of nonspecific gliosis.  There were no abnormal lesions or findings of acute changes or indication suggestive of stroke or other cerebrovascular issues.  Current Status:  The patient describes mildly progressing or stable memory deficits that have been going on for the past 5 years along with significant history of anxiety and depressive symptoms.  The patient also has been diagnosed with obstructive sleep apnea with initiation of CPAP/AutoPap.  The patient is described as having times of inconsistent compliance but reported improved compliance more recently.  Behavioral Observation: [name]  presents as a 68 y.o.-year-old Right African American Female who appeared her stated age. her dress was Appropriate and she was Well Groomed and her manners  were Appropriate to the situation.  her participation was indicative of Appropriate and Redirectable behaviors.  There were not any physical disabilities noted.  she displayed an appropriate level of cooperation and motivation.     Interactions:    Active Appropriate  Attention:   abnormal and the patient appeared to be distracted by some internal preoccupations and worries.  Memory:   abnormal; remote memory intact, recent memory impaired  Visuo-spatial:  not examined  Speech (Volume):  low  Speech:   normal; normal  Thought Process:  Coherent and Relevant  Though Content:  Rumination; not suicidal and not homicidal  Orientation:   person, place, time/date and situation  Judgment:   Good  Planning:   Good  Affect:    Anxious  Mood:    Anxious and Dysphoric  Insight:   Good  Intelligence:   normal  Marital Status/Living: The patient currently lives with her husband of 43 years.  They were married in 1977.  The patient has a 6 year old son who is had a kidney transplant as well as twin daughters age 65.  Current Employment: The patient is retired.  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  Hobbies and interests include  reading, gardening, flowers, crochet and doing puzzles.  Substance Use:  No concerns of substance abuse are reported.    Education:   HS Graduate  Medical History:   Past Medical History:  Diagnosis Date  . Anemia   . Hyperlipidemia   . Hypertension   . Pre-diabetes   . Sleep apnea       Psychiatric History:  The patient does have a prior history of anxiety that really came to the surface when her son began having kidney issues and medical issues.  The patient also describes symptoms consistent with depression has been diagnosed with anxiety and depression in the past.  She is currently taking Xanax twice a day 0.5 mg as well as sertraline.  Family Med/Psych History:  Family History  Problem  Relation Age of Onset  . Diabetes Mother   . Hypertension Mother   . Kidney disease Father   . Heart disease Father   . Stroke Sister      Impression/DX:  [name] is a 68 year old female referred by Dr. Rexene Alberts, who is her neurologist with [facility], for neuropsychological evaluation due to the patient reporting memory difficulties that seem to be progressing to some degree.  The patient is also followed by Cresenciano Lick. Renold Genta, MD, who is her PCP.  The patient reports that she was diagnosed with mild cognitive impairment approximately 5 years ago and has consistently reported difficulties with forgetfulness and memory issues as well as anxiety, stress and depressive symptoms.  The patient reports that her memory difficulties have slowly progressed over the past 5 years or so but does not describe a stepwise progression.  The patient denies any other types of particular cognitive difficulties.  The patient has had times where she did rather poorly on mental status exams at various assessments.  The patient is also been diagnosed with obstructive sleep apnea and at times have had significantly low compliance but other times where she is improving compliance.  She still has a way to go as far as improving her compliance with CPAP machine.  The patient describes mildly progressing or stable memory deficits that have been going on for the past 5 years along with significant history of anxiety and depressive symptoms.  The patient also has been diagnosed with obstructive sleep apnea with initiation of CPAP/AutoPap.  The patient is described as having times of inconsistent compliance but reported improved compliance more recently.  Disposition/Plan:  We have scheduled the patient for formal neuropsychological testing that will initially consist of the Wechsler Adult Intelligence Scale-IV as well as the Wechsler Memory Scale-IV.  We will also likely look at other memory test including the CVLT.  Once  this objective testing is completed a formal report will be produced and provided to her referring physician and a feedback session will be scheduled with the patient herself.  Diagnosis:    Memory loss  Depression with anxiety  Obstructive sleep apnea syndrome         Electronically Signed   _______________________ Ilean Skill, Psy.D.

## 2019-01-13 NOTE — Progress Notes (Signed)
Thanks so much. 

## 2019-01-24 ENCOUNTER — Other Ambulatory Visit: Payer: Self-pay

## 2019-01-24 ENCOUNTER — Encounter: Payer: Medicare Other | Attending: Neurology | Admitting: Psychology

## 2019-01-24 ENCOUNTER — Encounter: Payer: Self-pay | Admitting: Psychology

## 2019-01-24 DIAGNOSIS — E8881 Metabolic syndrome: Secondary | ICD-10-CM | POA: Diagnosis not present

## 2019-01-24 DIAGNOSIS — F418 Other specified anxiety disorders: Secondary | ICD-10-CM | POA: Insufficient documentation

## 2019-01-24 DIAGNOSIS — R4589 Other symptoms and signs involving emotional state: Secondary | ICD-10-CM | POA: Insufficient documentation

## 2019-01-24 DIAGNOSIS — F0280 Dementia in other diseases classified elsewhere without behavioral disturbance: Secondary | ICD-10-CM | POA: Diagnosis not present

## 2019-01-24 DIAGNOSIS — R413 Other amnesia: Secondary | ICD-10-CM | POA: Diagnosis not present

## 2019-01-24 DIAGNOSIS — G4733 Obstructive sleep apnea (adult) (pediatric): Secondary | ICD-10-CM | POA: Diagnosis not present

## 2019-01-24 DIAGNOSIS — Z79899 Other long term (current) drug therapy: Secondary | ICD-10-CM | POA: Insufficient documentation

## 2019-01-24 DIAGNOSIS — G3184 Mild cognitive impairment, so stated: Secondary | ICD-10-CM | POA: Diagnosis not present

## 2019-01-24 DIAGNOSIS — G300 Alzheimer's disease with early onset: Secondary | ICD-10-CM | POA: Diagnosis not present

## 2019-01-24 NOTE — Progress Notes (Signed)
The patient arrived on time to her 8:00 testing appointment and was accompanied by her husband. The evaluation lasted 240 minutes.  Behavioral Observations:  Appearance: Casually and appropriately dressed with good hygiene. Gait: Ambulated independently without assistance.  Speech: Normal rate, tone & volume Thought process:  Mostly logical, goal directed, somewhat disorganized and concrete. Mood/Affect: Mild-moderately anxious and depressed, appropriate range.  Interpersonal: Polite and appropriate.  Orientation: Mostly oriented, incorrect month and date (e.g. "June"), correct year. Accurately named current [employer].  Effort/Motivation: Good   She had no difficulty hearing or seeing test items. She appeared to get mildly frustrated on questions she did not know or tasks that were more difficult but exhibited appropriate distress tolerance overall.   Tests Administered: . Animal Naming  . [place] Verbal Learning Test, 3rd Edition, Brief Version (CVLT-III, Brief) . Controlled Word Association Test (COWAT) . Trail Making Test (Part A & B) . Wechsler Adult Intelligence Scale, 4th Edition (WAIS-IV) . Wechsler Memory Scale, 4th edition, Adult Battery (WMS-IV-A), Select Subtests  Results:  Animal Naming . Total=13, T=45, 31st % o Repetitions=1 o Intrusion(s)=1 COWAT  . FAS Total=34, T=54, 46th % o Repetitions=8 o Intrusions=3 CVLT-III, Brief  Demographically Adjusted Core Scores Summary    Immediate Recall Score Scaled score Demographically adjusted score    Trial 1 Correct 8 43    Trial 2 Correct 3 20    Trial 3 Correct 3 24    Trial 4 Correct 3 24      Delayed Recall Score Scaled score Demographically adjusted score    Short Delay Free Recall Correct 1 20    Long Delay Free Recall Correct 1 20    Long Delay Cued Recall Correct 1 20      Yes/No Recognition Score Scaled score Demographically adjusted score    Total Hits 5 34    Total False Positives 1  23    Recognition Discriminability (d') 2 26      Recall Errors Score Scaled score Demographically adjusted score    Total Intrusions 6 36      Standard Score Score Standard score Demographically adjusted score    Total Recall Responses 60 20    Demographically Adjusted Standard Score Summary Index Index score Demographically adjusted score    Trials 1-4 Correct 62 20    Delayed Recall Correct 45 20    Total Recall Correct 50 20      Trail Making Test . Part A  o Time=34", T=54,  66th %  . Part B o Time=153", T=43, 25th % WAIS-IV  Composite Score Summary  Scale Sum of Scaled Scores Composite Score Percentile Rank 95% Conf. Interval Qualitative Description  Verbal Comprehension 24 VCI 89 23 84-95 Low Average  Perceptual Reasoning 27 PRI 94 34 88-101 Average  Working Memory 17 WMI 92 30 86-99 Average  Processing Speed 11 PSI 76 5 70-87 Borderline  Full Scale 79 FSIQ 85 16 81-89 Low Average  General Ability 51 GAI 91 27 86-96 Average   Index Level Discrepancy Comparisons  Comparison Score 1 Score 2 Difference Critical Value .05 Significant Difference Y/N Base Rate by Overall Sample  VCI - PRI 89 94 -5 8.31 N 37.1  VCI - WMI 89 92 -3 8.82 N 41.5  VCI - PSI 89 76 13 10.19 Y 20.3  PRI - WMI 94 92 2 9.74 N 46.5  PRI - PSI 94 76 18 11.00 Y 12.1  WMI - PSI 92 76 16 11.38 Y 14.9  FSIQ -  GAI 85 91 -6 3.08 Y 12.4   Differences Between Subtest and Overall Mean of Subtest Scores  Subtest Subtest Scaled Score Mean Scaled Score Difference Critical Value .05 Strength or Weakness Base Rate  Symbol Search 4 7.90 -3.90 3.42 W 10%  Information 5 7.90 -2.90 2.19 W 15-25%        WMS-IV- Adult Battery Primary Subtest Scaled Score Summary  Subtest Domain Raw Score Scaled Score Percentile Rank  Logical Memory I AM 13 4 2   Logical Memory II AM 0 1 0.1  Visual Reproduction I VM 14 1 0.1  Visual Reproduction II VM 0 1 0.1  Spatial Addition VWM 3 4 2   Symbol Span VWM  7 5 5    Auditory Memory Process Score Summary  Process Score Raw Score Scaled Score Percentile Rank Cumulative Percentage (Base Rate)  LM II Recognition 19 - - 3-9%   Visual Memory Process Score Summary  Process Score Raw Score Scaled Score Percentile Rank Cumulative Percentage (Base Rate)  VR II Recognition 3 - - 10-16%

## 2019-01-28 ENCOUNTER — Telehealth: Payer: Self-pay | Admitting: Internal Medicine

## 2019-01-28 ENCOUNTER — Encounter

## 2019-01-28 ENCOUNTER — Encounter (HOSPITAL_BASED_OUTPATIENT_CLINIC_OR_DEPARTMENT_OTHER): Payer: Medicare Other | Admitting: Psychology

## 2019-01-28 ENCOUNTER — Other Ambulatory Visit: Payer: Self-pay

## 2019-01-28 ENCOUNTER — Encounter: Payer: Self-pay | Admitting: Psychology

## 2019-01-28 DIAGNOSIS — F418 Other specified anxiety disorders: Secondary | ICD-10-CM

## 2019-01-28 DIAGNOSIS — R413 Other amnesia: Secondary | ICD-10-CM

## 2019-01-28 DIAGNOSIS — G300 Alzheimer's disease with early onset: Secondary | ICD-10-CM

## 2019-01-28 DIAGNOSIS — F0280 Dementia in other diseases classified elsewhere without behavioral disturbance: Secondary | ICD-10-CM | POA: Diagnosis not present

## 2019-01-28 DIAGNOSIS — E8881 Metabolic syndrome: Secondary | ICD-10-CM

## 2019-01-28 DIAGNOSIS — G4733 Obstructive sleep apnea (adult) (pediatric): Secondary | ICD-10-CM

## 2019-01-28 DIAGNOSIS — Z79899 Other long term (current) drug therapy: Secondary | ICD-10-CM | POA: Diagnosis not present

## 2019-01-28 NOTE — Progress Notes (Signed)
Thank you! This is very helpful.

## 2019-01-28 NOTE — Progress Notes (Signed)
Neuropsychological Evaluation   Patient:  [name]   DOB: 1951/02/23  MR Number: [id number]  Location: [facility]ILITATION [address] [place], [address] [id number] [facility] [place] [address] Dept: [phone]  Start: 10 AM End: 11 AM  Provider/Observer:     Hershal Coria PsyD  Chief Complaint:      Chief Complaint  Patient presents with  . Memory Loss  . Anxiety  . Depression    Reason For Service:     Kristan Brummitt is a 68 year old female referred by Dr. Frances Furbish, who is her neurologist with [facility], for neuropsychological evaluation due to the patient reporting memory difficulties that seem to be progressing to some degree.  The patient is also followed by Luanna Cole. Lenord Fellers, MD, who is her PCP.  The patient reports that she was diagnosed with mild cognitive impairment approximately 5 years ago and has consistently reported difficulties with forgetfulness and memory issues as well as anxiety, stress and depressive symptoms.  The patient reports that her memory difficulties have slowly progressed over the past 5 years or so but does not describe a stepwise progression.  The patient denies any other types of particular cognitive difficulties.  The patient has had times where she did rather poorly on mental status exams at various assessments.  The patient is also been diagnosed with obstructive sleep apnea and at times have had significantly low compliance but other times where she is improving compliance.  She still has a way to go as far as improving her compliance with CPAP machine.  While the patient reports some progression with her memory difficulties she describes it as generally stable over the past several years.  The patient describes good days and bad days and describes correlation between her memory difficulties and her level of anxiety.  The patient does take Xanax (0.5 mg up to  twice per day.  The patient is also taking sertraline for her depression.  The patient takes lisinopril at bedtime to facilitate with sleep.  The patient denies any other cognitive changes including expressive or receptive language changes, executive functioning/reasoning and problem changes or visual spatial/geographic disorientation type symptoms.  She relates that the only cognitive change that she identifies have to do with her memory.  The patient did have some questions about whether relationship issues, anxiety or depression could play a role in her memory difficulties and is concerned that they may be playing a significant role as they are correlated.  The patient reports that she has had issues with anxiety for some time.  She reports that years ago her son was diagnosed with kidney disease and as her father had also been on dialysis this really concerned and worried her.  Her adult son has had a kidney transplant and at this point is doing quite well with no problems as far as his kidneys.  The patient reports that more recently that she feels more alone at times and does not feel like she gets a lot of support from her husband who spends a lot of time away from the home playing golf or with other activities during the day.  The patient has a medical history including hypertension, hyperlipidemia, sleep apnea, history of iron deficiency anemia, osteoarthritis, benign or innocent cardiac murmurs, anxiety, impaired glucose intolerance, metabolic syndrome and depression.  The patient had an MRI of her brain with and without contrast conducted on 06/30/2018.  The impressions of  this MRI were of mild periventricular and subcortical foci of nonspecific gliosis.  There were no abnormal lesions or findings of acute changes or indication suggestive of stroke or other cerebrovascular issues.  Behavioral Observations: Appearance:Casually and appropriately dressed with good hygiene. Gait:Ambulated  independently without assistance.  Speech:Normal rate, tone & volume Thought process: Mostly logical, goal directed, somewhat disorganized and concrete. Mood/Affect:Mild-moderately anxious and depressed, appropriate range.  Interpersonal: Polite and appropriate.  Orientation: Mostly oriented, incorrect month and date (e.g. "June"), correct year. Accurately named current [employer].  Effort/Motivation: Good   She had no difficulty hearing or seeing test items. She appeared to get mildly frustrated on questions she did not know or tasks that were more difficult but exhibited appropriate distress tolerance overall.   Tests Administered:  Animal Naming   [employer], 3rd Edition, Brief Version (CVLT-III, Brief)  Controlled Word Association Test (COWAT)  Trail Making Test (Part A & B)  Wechsler Adult Intelligence Scale, 4th Edition (WAIS-IV) Wechsler Memory Scale, 4th edition, Adult Battery (WMS-IV-A), Select Subtests   Test Results:   Initially, an estimation was produced regarding the patient's historical/premorbid cognitive functioning based on her educational and occupational history.  This estimation was that the patient should be performing in the average range relative to a normative population historically with performances typically between composite scores of 95-105.  Composite Score Summary  Scale Sum of Scaled Scores Composite Score Percentile Rank 95% Conf. Interval Qualitative Description  Verbal Comprehension 24 VCI 89 23 84-95 Low Average  Perceptual Reasoning 27 PRI 94 34 88-101 Average  Working Memory 17 WMI 92 30 86-99 Average  Processing Speed 11 PSI 76 5 70-87 Borderline  Full Scale 79 FSIQ 85 16 81-89 Low Average  General Ability 51 GAI 91 27 86-96 Average   The patient was administered the Wechsler Adult Intelligence Scale-IV.  She produced a full scale IQ score of 85 which falls at the 16th percentile and is in the low average  range.  This composite global score suggest that there have been some decreases in some aspects of overall cognitive functioning.  We also calculated the general abilities index score which was a 91 and falls at the 27th percentile and is in the average range.  This is only slightly below predicted levels.  The general abilities index score does not take into account or weight very heavily issues of information processing speed and working memory and the fact that the patient's information processing speed composite was her lowest performing area this accounts for the higher general abilities index versus full-scale IQ score index.  Verbal Comprehension Subtests Summary  Subtest Raw Score Scaled Score Percentile Rank Reference Group Scaled Score SEM  Similarities 24 10 50 10 1.04  Vocabulary 35 9 37 10 0.67  Information 6 5 5 6  0.73  (Comprehension) 17 7 16 7  1.08    The patient produced a verbal comprehension index score of 89 which falls at the 23rd percentile and is in the low average range.  There was considerable variability in subtest performance.  The patient performed in the average range with regard to her verbal reasoning and problem-solving in general vocabulary knowledge.  There was some mild weakness with regard to her social judgment and comprehension relative to a normative population and predicted levels.  The patient showed deficits for her general fund of information and world knowledge.  However, this particular measures unlikely to be significantly affected by her current neurological and medical issues and likely  reflects her focus in her educational experience and reduced engagement in overall world knowledge and information.  Perceptual Reasoning Subtests Summary  Subtest Raw Score Scaled Score Percentile Rank Reference Group Scaled Score SEM  Block Design 28 9 37 6 1.08  Matrix Reasoning 11 8 25 6  0.90  Visual Puzzles 11 10 50 7 0.85  (Picture Completion) 5 6 9 4  1.16   The  patient produced a perceptual reasoning index score of 94 which falls in the 34th percentile and is in the average range.  There was also scatter for measures on this indices.  The patient performed in the average range with regard to her visual analysis and organizational abilities, visual reasoning and problem-solving and estimation abilities.  The patient showed mild to moderate impairments with regard to her ability to identify visual anomalies within a visual gestalt.   Working Doctor, general practice Raw Score Scaled Score Percentile Rank Reference Group Scaled Score SEM  Digit Span 21 8 25 6  0.79  Arithmetic 13 9 37 9 0.99    The patient produced a working memory index score of 92 which falls at the 30th percentile and is in the average range.  Little subtest scatter was noted.  The patient performed in the average to lower end of the average range with regard to auditory encoding abilities and multi processing challenges.   Processing Speed Subtests Summary  Subtest Raw Score Scaled Score Percentile Rank Reference Group Scaled Score SEM  Symbol Search 11 4 2 2  1.31  Coding 40 7 16 5  0.99  (Cancellation) 24 6 9 5  1.34    The patient produced a processing speed index score of 76 which falls at the 5th percentile and is in the borderline/impaired range of functioning.  The patient showed significant deficits on measures of visual scanning, visual searching and overall speed of mental operations.  This deficit with regard to focus execute abilities was significant and was in fact her lowest global area of functioning.   Trail Making Test  Part A  ? Time=34", T=54,  66th %   Part B ? Time=153", T=43, 25th %   To further analyze the patient's information processing speed and cognitive shifting the patient was administered the Trail Making Test part a and B.  The patient performed well on simple measures of visual scanning and visual searching on this item but when shifting  of attention and information processing similar to other measures were added the patient showed a significant decrease in performance.  This is consistent with what was found in the other subtest of the processing speed index suggesting that the patient has difficulty with rapid information processing speed and focus execute abilities.   WMS-IV- Adult Battery       Primary Subtest Scaled Score Summary   Subtest Domain Raw Score Scaled Score Percentile Rank  Logical Memory I AM 13 4 2   Logical Memory II AM 0 1 0.1  Visual Reproduction I VM 14 1 0.1  Visual Reproduction II VM 0 1 0.1  Spatial Addition VWM 3 4 2   Symbol Span VWM 7 5 5          Auditory Memory Process Score Summary   Process Score Raw Score Scaled Score Percentile Rank Cumulative Percentage (Base Rate)  LM II Recognition 19 - - 3-9%         Visual Memory Process Score Summary   Process Score Raw Score Scaled Score Percentile Rank Cumulative Percentage (Base Rate)  VR  II Recognition 3 - - 10-16%   The patient was administered selected subtest from the Wechsler Memory Scale-IV.  The patient's performance on verbal memory task (logical memory) showed significant deficits for both initial immediate learning as well as delayed memory and recall of information.  The patient performed below the 2nd percentile on both measures.  She did improve some on recognition format but her performance remained impaired even under a cued/recognition format suggesting significant problems with storage, organization and retrieval of information.  The patient did well on measures of auditory encoding so this is more than simply an encoding deficit and having greater issues with transfer of information from encoded format to stored and organized information for both immediate and delayed recall.  The patient was then administered visual memory task (visual reproduction 1 and 2) the patient's performance on this visual memory task was significantly  impaired.  While the patient had done relatively well on the auditory encoding test the patient did have significant deficits with regard to visual encoding measures.  However, the patient's visual learning both initial and delayed was significantly more impaired than could be explained by simple deficits with regard to visual encoding.  The patient did not initially organize store or be able to retrieve information after period of delay.  In fact her visual reproductive recognition score was also significantly impaired suggesting deficits and storage and organization of visual information as well.   CVLT-III, Brief  Demographically Adjusted Core Scores Summary    Immediate Recall Score Scaled score Demographically adjusted score    Trial 1 Correct 8 43    Trial 2 Correct 3 20    Trial 3 Correct 3 24    Trial 4 Correct 3 24      The patient's performance on the [place] showed that after the presentation of the initial list of words for recall she did fairly well with one presentation producing a scaled score of 8 and a T score of 43 which falls at the lower end of the average range relative to a normative population.  However, the patient showed little improvement or benefit from repeated exposures to this list and by trial for her performance was significantly impaired with little improvement over time.  Delayed Recall Score Scaled score Demographically adjusted score    Short Delay Free Recall Correct 1 20    Long Delay Free Recall Correct 1 20    Long Delay Cued Recall Correct 1 20      On the delayed recall portion of this measure the patient showed severe and significant deficits with regard to both short delayed free recall as well as long delayed free recall.  Also, after the long delayed free recall was completed a cued format was done where the patient was asked whether specific words were or were not on the list.  Her performance showed no improvement  with cueing.  This strongly suggest as before that the patient is having significant impairments with the storage and organization of information.  Yes/No Recognition Score Scaled score Demographically adjusted score    Total Hits 5 34    Total False Positives 1 23    Recognition Discriminability (d') 2 26      Recall Errors Score Scaled score Demographically adjusted score    Total Intrusions 6 36      Standard Score Score Standard score Demographically adjusted score    Total Recall Responses 60 20    Demographically Adjusted Standard Score  Summary Index Index score Demographically adjusted score    Trials 1-4 Correct 62 20    Delayed Recall Correct 45 20    Total Recall Correct 50 20       Animal Naming  Total=13, T=45, 31st % ? Repetitions=1 ? Intrusion(s)=1 COWAT   FAS Total=34, T=54, 46th % ? Repetitions=8 ? Intrusions=3  The patient was administered to verbal fluency tasks to assess verbal fluency and naming abilities.  The patient's performance on both of these measures was generally within normal limits and fell between the 31st and 46 percentile suggesting no significant reduction in verbal fluency.  Summary of Results:   The results of the current neuropsychological evaluation are consistent with specific deficits with regard to global information processing speed and focus execute abilities, some deficits with regard to visual attention and visual processing, and most significantly significant deficits in both auditory and verbal learning and memory.  The patient did fairly well on auditory encoding task and only mild deficits with regard to visual encoding measures.  However, on both short delayed and long delayed measures visually and auditorily the patient showed significant deficits in her ability to store and organize information.  These deficits in memory and learning were not simply associated to impaired retrieval of learned information as the  patient showed no improvement in memory functions under cued/recognition formats.  Impression/Diagnosis:   Overall, the results of the current neuropsychological evaluation are consistent with a significant loss of function with regard to memory and learning, overall information processing speed as well as some specific aspects of visual perception and identification.  The patient showed some mild mild periventricular and subcortical foci of nonspecific gliosis but no significant or widespread white matter/subcortical abnormalities.  The patient does have a long history of significant depressive and anxiety based symptoms and significant chronic stress anxiety.  The patient does have a diagnosis of obstructive sleep apnea and she is not always been compliant with her CPAP machine although that is improving.  As far as diagnostic considerations, this pattern would be consistent with some early stage conditions such as Alzheimer's dementia although her parietal lobe functions appear to be doing quite well as well as frontal lobe functions.  This does appear to be more of a involvement of temporal lobe/hippocampal function as well as potential changes in white matter tracts producing the slowed information processing speed.  The level of impairments could not be explained simply by depression and anxiety or untreated sleep apnea as the patient did well on encoding measures, which would have been most affected by depression, anxiety or chronic sleep deprivation.  However, sustained sleep deprivation, anxiety and depression can have a negative neurological effect on hippocampal function and deterioration.  The patient shows no other symptoms consistent with Lewy body dementia and the working diagnosis at this point would be most consistent with an Alzheimer's dementia type condition primarily impacting the temporal and hippocampal functions along with some subcortical/white matter changes producing reduction in  overall speed of mental operations.  We will need to reassess the patient in approximately 9 months to look at any progressive or significant changes over this time to be more definitive about diagnostic questions.  It will be imperative for the patient to place special emphasis on utilizing her CPAP machine and engaging in activities that would be helpful for overall health including dietary, sleep and physical activity issues.  Diagnosis:    Axis I: Early onset Alzheimer's dementia without behavioral disturbance (HCC)  Memory loss  Depression with anxiety  Obstructive sleep apnea syndrome  Metabolic syndrome   Arley PhenixJohn Gurkirat Basher, Psy.D. Neuropsychologist

## 2019-01-28 NOTE — Telephone Encounter (Addendum)
Called [name] to schedule appointment for next week so that he and Marene came come in and discuss recent testing results.

## 2019-02-01 ENCOUNTER — Encounter: Payer: Self-pay | Admitting: Internal Medicine

## 2019-02-01 ENCOUNTER — Ambulatory Visit (INDEPENDENT_AMBULATORY_CARE_PROVIDER_SITE_OTHER): Payer: Medicare Other | Admitting: Internal Medicine

## 2019-02-01 ENCOUNTER — Other Ambulatory Visit: Payer: Self-pay

## 2019-02-01 VITALS — BP 130/88 | HR 62 | Temp 98.2°F | Ht 62.0 in | Wt 195.0 lb

## 2019-02-01 DIAGNOSIS — R413 Other amnesia: Secondary | ICD-10-CM | POA: Diagnosis not present

## 2019-02-01 MED ORDER — DONEPEZIL HCL 5 MG PO TABS: 5.0000 mg | ORAL_TABLET | Freq: Every day | ORAL | 0 refills | Status: DC

## 2019-02-01 NOTE — Progress Notes (Signed)
   Subjective:    Patient ID: [name], female    DOB: [birth year], 68 y.o.   MRN: [id number]  HPI I asked patient and her husband to come in today to discuss recent evaluation by Dr. Sima Matas, neuropsychologist.  Patient recently had extensive neuropsychological testing showing specific deficit with regard to global information processing, speed, focus, and executing abilities in some deficits regard to visual attention and visual processing.  Significant deficits in both auditory and verbal learning and memory.  It was felt she likely has early stages of Alzheimer's dementia with more involvement in temporal lobe/hippocampal function.  She is able to comprehend that she does have some issues with her memory.  Her husband also acknowledges that as well.  Apparently her mother had history of dementia.    Review of Systems no new complaints and appears to not be anxious     Objective:   Physical Exam  Spent 20 minutes speaking with patient and her husband about these results and my concerns.  Sometimes patient forgets appointments here.  She has follow-up appointment with neurologist in December but I would like to go ahead and start her on Aricept 5 mg daily pending that appointment.  She is agreeable to this.  She is also on Zoloft 100 mg daily for anxiety and depression and takes Xanax twice daily as needed for anxiety.      Assessment & Plan:  Memory loss  Plan: Begin Aricept 5 mg daily and follow-up with neurologist in December.

## 2019-02-07 ENCOUNTER — Other Ambulatory Visit: Payer: Self-pay

## 2019-02-07 ENCOUNTER — Encounter (HOSPITAL_BASED_OUTPATIENT_CLINIC_OR_DEPARTMENT_OTHER): Payer: Medicare Other | Admitting: Psychology

## 2019-02-07 DIAGNOSIS — F418 Other specified anxiety disorders: Secondary | ICD-10-CM

## 2019-02-07 DIAGNOSIS — F0280 Dementia in other diseases classified elsewhere without behavioral disturbance: Secondary | ICD-10-CM

## 2019-02-07 DIAGNOSIS — Z79899 Other long term (current) drug therapy: Secondary | ICD-10-CM | POA: Diagnosis not present

## 2019-02-07 DIAGNOSIS — G300 Alzheimer's disease with early onset: Secondary | ICD-10-CM

## 2019-02-07 DIAGNOSIS — G4733 Obstructive sleep apnea (adult) (pediatric): Secondary | ICD-10-CM | POA: Diagnosis not present

## 2019-02-07 DIAGNOSIS — R413 Other amnesia: Secondary | ICD-10-CM

## 2019-02-07 DIAGNOSIS — E8881 Metabolic syndrome: Secondary | ICD-10-CM | POA: Diagnosis not present

## 2019-02-11 ENCOUNTER — Encounter: Payer: Self-pay | Admitting: Psychology

## 2019-02-11 NOTE — Progress Notes (Signed)
Today I provided feedback regarding the results of the recent neuropsychological evaluation.  This visit was 1 hour duration and was conducted in person at my outpatient clinic office with myself and the patient present.  Below you will find a copy of the summary and impressions from that full neuropsychological evaluation that can be found in its entirety with the EMR date visit of 01/28/2019.  The full initial diagnostic/clinical interview can be found on her appointment date of 01/13/2019.    Summary of Results:                        The results of the current neuropsychological evaluation are consistent with specific deficits with regard to global information processing speed and focus execute abilities, some deficits with regard to visual attention and visual processing, and most significantly significant deficits in both auditory and verbal learning and memory.  The patient did fairly well on auditory encoding task and only mild deficits with regard to visual encoding measures.  However, on both short delayed and long delayed measures visually and auditorily the patient showed significant deficits in her ability to store and organize information.  These deficits in memory and learning were not simply associated to impaired retrieval of learned information as the patient showed no improvement in memory functions under cued/recognition formats.  Impression/Diagnosis:                     Overall, the results of the current neuropsychological evaluation are consistent with a significant loss of function with regard to memory and learning, overall information processing speed as well as some specific aspects of visual perception and identification.  The patient showed some mild mild periventricular and subcortical foci of nonspecific gliosis but no significant or widespread white matter/subcortical abnormalities.  The patient does have a long history of significant depressive and anxiety based symptoms and  significant chronic stress anxiety.  The patient does have a diagnosis of obstructive sleep apnea and she is not always been compliant with her CPAP machine although that is improving.  As far as diagnostic considerations, this pattern would be consistent with some early stage conditions such as Alzheimer's dementia although her parietal lobe functions appear to be doing quite well as well as frontal lobe functions.  This does appear to be more of a involvement of temporal lobe/hippocampal function as well as potential changes in white matter tracts producing the slowed information processing speed.  The level of impairments could not be explained simply by depression and anxiety or untreated sleep apnea as the patient did well on encoding measures, which would have been most affected by depression, anxiety or chronic sleep deprivation.  However, sustained sleep deprivation, anxiety and depression can have a negative neurological effect on hippocampal function and deterioration.  The patient shows no other symptoms consistent with Lewy body dementia and the working diagnosis at this point would be most consistent with an Alzheimer's dementia type condition primarily impacting the temporal and hippocampal functions along with some subcortical/white matter changes producing reduction in overall speed of mental operations.  We will need to reassess the patient in approximately 9 months to look at any progressive or significant changes over this time to be more definitive about diagnostic questions.  It will be imperative for the patient to place special emphasis on utilizing her CPAP machine and engaging in activities that would be helpful for overall health including dietary, sleep and physical activity issues.  Diagnosis:  Axis I: Early onset Alzheimer's dementia without behavioral disturbance (HCC)  Memory loss  Depression with anxiety  Obstructive sleep apnea  syndrome  Metabolic syndrome   Arley Phenix, Psy.D. Neuropsychologist

## 2019-02-13 ENCOUNTER — Other Ambulatory Visit: Payer: Self-pay | Admitting: Internal Medicine

## 2019-02-16 NOTE — Patient Instructions (Signed)
Begin Aricept 5 mg daily and follow-up with neurologist in December.

## 2019-02-25 ENCOUNTER — Ambulatory Visit: Payer: Medicare Other

## 2019-03-14 ENCOUNTER — Ambulatory Visit: Payer: Medicare Other | Admitting: Psychology

## 2019-03-29 ENCOUNTER — Ambulatory Visit: Payer: Medicare Other | Admitting: Psychology

## 2019-03-29 ENCOUNTER — Encounter: Payer: Self-pay | Admitting: Neurology

## 2019-03-31 ENCOUNTER — Other Ambulatory Visit: Payer: Self-pay

## 2019-03-31 ENCOUNTER — Encounter: Payer: Self-pay | Admitting: Neurology

## 2019-03-31 ENCOUNTER — Ambulatory Visit (INDEPENDENT_AMBULATORY_CARE_PROVIDER_SITE_OTHER): Payer: Medicare Other | Admitting: Neurology

## 2019-03-31 VITALS — BP 140/78 | HR 59 | Ht 62.0 in | Wt 196.0 lb

## 2019-03-31 DIAGNOSIS — R413 Other amnesia: Secondary | ICD-10-CM | POA: Diagnosis not present

## 2019-03-31 DIAGNOSIS — G4733 Obstructive sleep apnea (adult) (pediatric): Secondary | ICD-10-CM

## 2019-03-31 DIAGNOSIS — Z789 Other specified health status: Secondary | ICD-10-CM

## 2019-03-31 DIAGNOSIS — Z9989 Dependence on other enabling machines and devices: Secondary | ICD-10-CM

## 2019-03-31 NOTE — Progress Notes (Signed)
Subjective:    Patient ID: [name] is a 68 y.o. female.  HPI     Interim history:   [name] is a 68 year old right-handed woman with an underlying medical history of hypertention, hyperlipidemia, anemia, prediabetes, sleep apnea, and obesity, who presents for follow-up consultation of her memory loss, her obstructive sleep apnea.  The patient is unaccompanied today.  I last saw her on 12/30/2018, at which time we talked about her interim test results including her brain MRI result and sleep evaluation with her home sleep test showing moderate obstructive sleep apnea.  She was not fully compliant with AutoPap.  She was encouraged to be fully compliant.  She was also reminded to stay well-hydrated.  She was advised to make an appointment with her DME company for a mask refit.  She had interim consultation with Dr. Sima Matas in September and a follow-up appointment for discussion on 02/07/2019 and I reviewed the note: << Diagnosis:                                Axis I: Early onset Alzheimer's dementia without behavioral disturbance (Hydro)   Memory loss   Depression with anxiety   Obstructive sleep apnea syndrome   Metabolic syndrome>>   Reassessment in about 9 months was recommended by Dr. Sima Matas.    Today, 03/31/2019: I reviewed her AutoPap compliance data from 02/28/2019 through 03/29/2019 which is a total of 30 days, during which time she used her machine 26 days with percent use days greater than 4 hours at 27%, indicating low compliance with an average usage of 3 hours, residual AHI elevated at 16.4/h with primarily obstructive events, 95th percentile of pressure at 9.7 cm with a range of 7 cm to 13 cm with significant leak with the 95th percentile of 50.2 L/min and consistently high leak throughout.  She reports putting it on every night.  Her husband has noted the leak and they have had a mask refit appointment.  She would be willing to come in for a proper titration.  She is  willing to continue with sleep apnea treatment.  Memory wise she is stable.  She does not drink water very much.  Her husband tries to remind her.  They have been trying to eat well at home.  She has a follow-up appointment with Dr. Sima Matas in July next year.    The patient's allergies, current medications, family history, past medical history, past social history, past surgical history and problem list were reviewed and updated as appropriate.    Previously:    I first met her on 05/31/2018 at the request of her primary care physician, Dr. Renold Genta, at which time the patient reported a prior diagnosis of mild cognitive impairment some for 5 years ago.  The patient reported forgetfulness and also stress as well as residual depression.  Her MMSE was 20 out of 30 at the time.  She was encouraged to seek optimization of her depressive symptoms and follow-up with primary care.  In the interim, I also suggested we proceed with neuropsychological referral for cognitive testing, sleep study to rule out obstructive sleep apnea and a brain MRI.  She had a brain MRI with and without contrast on 06/30/2018 and I reviewed the results: IMPRESSION:    MRI brain (with and without) demonstrating: - Mild periventricular and subcortical foci of non-specific gliosis.  - No abnormal lesions are seen on post contrast views.   -  No acute findings.   Her split-night sleep study was canceled in March 2020 due to the COVID-19 pandemic.  She was referred to neuropsychology, Dr. Sima Matas, but has not had her evaluation yet.   She had a home sleep test on 10/07/2018 which indicated moderate obstructive sleep apnea with an AHI of 24.4/h, and O2 nadir of 82%.  Given the COVID-19 pandemic she was advised to proceed with AutoPap therapy.   I reviewed her AutoPap compliance data from 11/29/2018 through 12/28/2018 which is a total of 30 days, during which time she used her machine 18 days only, with percent use days greater than 4  hours at 17% only, indicating significantly low compliance with an average usage of 3 hours and 14 minutes only, residual AHI elevated at 12.2/h, 95th percentile of pressure at 10.7 cm, leak very high with a 95th percentile at 47.7 L/min, pressure range of 7 cm to 13 cm with EPR.  Her set up date was 11/01/2018, her percent use days greater than 4 hours for the past nearly 60 days was about 31%.  Residual AHI for all days about 7.8/h, average pressure for the 95th percentile at 9.8 cm, but leak has been consistently high.   05/31/2018: She reports increased forgetfulness. Her husband reports that she was diagnosed with mild congnitive impairment some 4-5 years ago. There was no FU testing or treatment at the time. Her husband reports that she has been more forgetful, repetitive, asking the same questions over and over again. Her driving seems to be good. She has not gotten lost. They take turns with a drive up to [place].  She endorses stress and attributes some of her forgetfulness to stress.  She reported sleeping fairly well. Sometimes she goes to bed late and sleeps in late. She does snore per husband's report. Her Epworth sleepiness score is 6 out of 24 today. I reviewed your office note from 05/25/2018. She had blood work through your office at the time which I reviewed. She had B12, folate, CBC with differential, CMP, ESR, RPR, TSH, free T4. B12 was elevated above 1000, folate normal, platelets slightly elevated at 410 CMP showed slightly elevated calcium and total protein, ESR mildly elevated at 65, RPR nonreactive, TSH and free T4 normal. She was advised to return for additional laboratory testing including parathyroid hormone and calcium.  She had a sleep study 2008 and was diagnosed with sleep apnea. She has not been on CPAP therapy. She had seen Dr. Valentina Shaggy in December 2016 on 03/22/2015 and 03/30/2015 and had neuropsychological evaluation. She was diagnosed with mild cognitive impairment in the setting of  depression. He recommended that she be restarted on antidepressant medication and consider counseling. She reports taking her pills regularly, but her husband is not so sure. She had b/l TKAs. She has no FHx of dementia. 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 Has 3 children, twin daughters in [place] and son in [place]. She admits that since her retirement she has become more withdrawn. She also adds that she is content being by herself. She has residual depression, and was supposed to increase the Zoloft. [facility] but not [facility]. She is a nonsmoker and drinks alcohol rarely, no daily caffeine, does drink occasional tea. She does not always hydrate well enough she admits, does not typically get 8 cups of water per day.  Her Past Medical History Is Significant For: Past Medical History:  Diagnosis Date  . Anemia   .  Hyperlipidemia   . Hypertension   . Pre-diabetes   . Sleep apnea     Her Past Surgical History Is Significant For: Past Surgical History:  Procedure Laterality Date  . GANGLION CYST EXCISION     l hand  . JOINT REPLACEMENT     knee right  . TUBAL LIGATION      Her Family History Is Significant For: Family History  Problem Relation Age of Onset  . Diabetes Mother   . Hypertension Mother   . Kidney disease Father   . Heart disease Father   . Stroke Sister     Her Social History Is Significant For: Social History   Socioeconomic History  . Marital status: Married    Spouse name: Not on file  . Number of children: Not on file  . Years of education: Not on file  . Highest education level: Not on file  Occupational History  . Not on file  Tobacco Use  . Smoking status: Never Smoker  . Smokeless tobacco: Never Used  Substance and Sexual Activity  . Alcohol use: Yes    Comment: rarely  . Drug use: No  . Sexual activity: Not on file  Other Topics Concern  . Not on file  Social History Narrative  . Not on file   Social  Determinants of Health   Financial Resource Strain:   . Difficulty of Paying Living Expenses: Not on file  Food Insecurity:   . Worried About Charity fundraiser in the Last Year: Not on file  . Ran Out of Food in the Last Year: Not on file  Transportation Needs:   . Lack of Transportation (Medical): Not on file  . Lack of Transportation (Non-Medical): Not on file  Physical Activity:   . Days of Exercise per Week: Not on file  . Minutes of Exercise per Session: Not on file  Stress:   . Feeling of Stress : Not on file  Social Connections:   . Frequency of Communication with Friends and Family: Not on file  . Frequency of Social Gatherings with Friends and Family: Not on file  . Attends Religious Services: Not on file  . Active Member of Clubs or Organizations: Not on file  . Attends Archivist Meetings: Not on file  . Marital Status: Not on file    Her Allergies Are:  No Known Allergies:   Her Current Medications Are:  Outpatient Encounter Medications as of 03/31/2019  Medication Sig  . ALPRAZolam (XANAX) 0.5 MG tablet TAKE ONE TABLET BY MOUTH TWICE A DAY AS NEEDED FOR ANXIETY  . Cholecalciferol (VITAMIN D) 1000 UNITS capsule Take 1,000 Units by mouth daily.    . cyanocobalamin 500 MCG tablet Take 500 mcg by mouth daily.  Marland Kitchen donepezil (ARICEPT) 5 MG tablet Take 1 tablet (5 mg total) by mouth at bedtime.  . ferrous sulfate 325 (65 FE) MG EC tablet Take 325 mg by mouth daily.   Marland Kitchen levothyroxine (SYNTHROID) 50 MCG tablet Take 1 tablet (50 mcg total) by mouth daily.  Marland Kitchen lisinopril (ZESTRIL) 10 MG tablet Take 1 tablet (10 mg total) by mouth daily.  . Multiple Vitamins-Minerals (MULTIVITAMIN WITH MINERALS) tablet Take 1 tablet by mouth daily.    . rosuvastatin (CRESTOR) 5 MG tablet Take 1 tablet (5 mg total) by mouth daily.  . sertraline (ZOLOFT) 100 MG tablet Take 1 tablet (100 mg total) by mouth daily.  Marland Kitchen triamterene-hydrochlorothiazide (MAXZIDE-25) 37.5-25 MG tablet Take 1  tablet by  mouth daily.  . vitamin E (VITAMIN E) 400 UNIT capsule Take 400 Units by mouth daily.   No facility-administered encounter medications on file as of 03/31/2019.  :  Review of Systems:  Out of a complete 14 point review of systems, all are reviewed and negative with the exception of these symptoms as listed below: Review of Systems  Neurological:       Pt presents today to discuss her cpap. Pt reports that she tries to use it every night.    Objective:  Neurological Exam  Physical Exam Physical Examination:   Vitals:   03/31/19 1044  BP: 140/78  Pulse: (!) 59   General Examination: The patient is a very pleasant 68 y.o. female in no acute distress. She appears well-developed and very well groomed.   HEENT:Normocephalic, atraumatic, pupils are equal, round and reactive to light and accommodation. Extraocular tracking is good without limitation to gaze excursion or nystagmus noted. Normal smooth pursuit is noted. Hearing is grossly intact. Face is symmetric with normal facial animation and normal facial sensation. Speech is clear with no dysarthria noted. There is no hypophonia. There is no lip, neck/head, jaw or voice tremor. Neck shows FROM. There are no carotid bruits on auscultation. Oropharynx exam reveals: moderate mouth dryness, gooddental hygiene and mildairway crowding. Tongue protrudes centrally and palate elevates symmetrically.   Chest:Clear to auscultation without wheezing, rhonchi or crackles noted.  VQXIH:W3+U8+8, mild systolic murmur noted. Not new, per pt.   Abdomen:Soft, non-tender and non-distended with normal bowel sounds appreciated on auscultation.  Extremities:There isnopitting edema in the distal lower extremities bilaterally.   Skin: Warm and dry without trophic changes noted.  Musculoskeletal: exam reveals no obvious joint deformities, tenderness or joint swelling or erythema.   Neurologically:  Mental status: The patient is  awake, alert and oriented in all 4 spheres.Herimmediate and remote memory, attention, language skills and fund of knowledge are fairly appropriate. There is no evidence of aphasia, agnosia, apraxia or anomia. Speech is clear with normal prosody and enunciation. Thought process is linear. Mood is okay, affect somewhat blunted.  Cranial nerves II - XII are as described above under HEENT exam. In addition: shoulder shrug is normal with equal shoulder height noted. Motor exam: Normal bulk, strength and tone is noted. There is notremor.   On2/01/2019: MMSE: 20/30 but variable effort noted, pt reported feeling stressed, CDT: 4/4, AFP: 10/min. On 12/30/2018: MMSE: 19/30, CDT: 4/4, AFT 12/min. Better effort noted by nurse.  Reflexes are1+ throughout. Fine motor skills and coordination: grossly intact. Cerebellar testing: No dysmetria or intention tremor. There is no truncal or gait ataxia.  Sensory exam: intact to light touch in the upper and lower extremities.  Gait, station and balance:Shestands easily. No veering to one side is noted. No leaning to one side is noted. Posture is age-appropriate and stance is narrow based. Gait showsnormalstride length and normalpace. No problems turning are noted.  Assessmentand Plan:   In summary,Jovonda T Simmsis a 50 year oldfemalewith an underlying medical history of hypertention, hyperlipidemia, anemia, prediabetes, sleep apnea, and obesity, whopresents for Follow-up consultation of her memory loss and sleep apnea. Her recent neuropsychological evaluation was supportive of a diagnosis of early Alzheimer's disease.  Reevaluation in about 9 months was recommended and she is scheduled to see the neuropsychologist in July of next year.  She has vascular risk factors.  Brain MRI from March 2020 showed mild vascular/white matter changes, no obvious atrophy, no acute findings thankfully. She had a home sleep test in  June 2020 which indicated moderate  obstructive sleep apnea.  She has been on AutoPap therapy since mid July, But her sleep apnea is not under full control, she is struggling with the usage with AutoPap and her leak is consistently high despite a recent mask refit through the DME provider.  I think we need to optimize her sleep apnea management which may in turn help her daytime symptoms and her mood and memory.I recommended a overnight laboratory attended CPAP titration study.  She is agreeable.  Her husband has been Very supportive and has been encouraging her to hydrate better.  She is advised to follow-up after his sleep study, we mutually agreed to hold off on any new medications including dementia medications at this moment until after her sleep study. I answered all the questions today and the patient and her husband were in agreement.

## 2019-03-31 NOTE — Patient Instructions (Signed)
You still have sleep apnea events, while on autoPAP and you still have Significant air leaking from the mask. We will potentially get better results if we brought you in for a sleep study for proper titration with a CPAP rather than AutoPap and/or consideration of another type of treatment called BiPAP which is a different pressure modality and a little bit more sophisticated than a CPAP machine.  We will call to set up the sleep study.  In the meantime, please continue using your autoPAP regularly. While your insurance requires that you use PAP at least 4 hours each night on 70% of the nights, I recommend, that you not skip any nights and use it throughout the night if you can. Getting used to PAP and staying with the treatment long term does take time and patience and discipline. Untreated obstructive sleep apnea when it is moderate to severe can have an adverse impact on cardiovascular health and raise her risk for heart disease, arrhythmias, hypertension, congestive heart failure, stroke and diabetes. Untreated obstructive sleep apnea causes sleep disruption, nonrestorative sleep, and sleep deprivation. This can have an impact on your day to day functioning and cause daytime sleepiness and impairment of cognitive function, memory loss, mood disturbance, and problems focussing. Using PAP regularly can improve these symptoms.

## 2019-05-01 ENCOUNTER — Other Ambulatory Visit: Payer: Self-pay | Admitting: Internal Medicine

## 2019-05-07 ENCOUNTER — Other Ambulatory Visit (HOSPITAL_COMMUNITY)
Admission: RE | Admit: 2019-05-07 | Discharge: 2019-05-07 | Disposition: A | Discharge status: Home / Self Care | Payer: Medicare Other | Source: Ambulatory Visit | Attending: Neurology | Admitting: Neurology

## 2019-05-07 DIAGNOSIS — Z01812 Encounter for preprocedural laboratory examination: Secondary | ICD-10-CM | POA: Diagnosis not present

## 2019-05-07 DIAGNOSIS — Z20822 Contact with and (suspected) exposure to covid-19: Secondary | ICD-10-CM | POA: Diagnosis not present

## 2019-05-07 LAB — SARS CORONAVIRUS 2 (TAT 6-24 HRS): SARS Coronavirus 2: NEGATIVE

## 2019-05-10 ENCOUNTER — Ambulatory Visit (INDEPENDENT_AMBULATORY_CARE_PROVIDER_SITE_OTHER): Payer: Medicare Other | Admitting: Neurology

## 2019-05-10 DIAGNOSIS — Z789 Other specified health status: Secondary | ICD-10-CM

## 2019-05-10 DIAGNOSIS — G4733 Obstructive sleep apnea (adult) (pediatric): Secondary | ICD-10-CM

## 2019-05-10 DIAGNOSIS — R413 Other amnesia: Secondary | ICD-10-CM

## 2019-05-10 DIAGNOSIS — Z9989 Dependence on other enabling machines and devices: Secondary | ICD-10-CM

## 2019-05-19 ENCOUNTER — Ambulatory Visit: Payer: Medicare Other | Admitting: Psychology

## 2019-05-19 ENCOUNTER — Telehealth: Payer: Self-pay

## 2019-05-19 NOTE — Procedures (Signed)
PATIENT'S NAME:  [name], [name] DOB:      1950-09-11      MR#:    [id number]     DATE OF RECORDING: 05/10/2019 REFERRING M.D.:  Tedra Senegal MD Study Performed:   CPAP  Titration HISTORY: 69 year old right-handed woman with an underlying medical history of hypertention, hyperlipidemia, anemia, prediabetes, sleep apnea, and obesity, who presents for a full night titration study to optimize her treatment and tolerance of PAP; she has been on autoPAP, but has had difficulty, residual AHI has been around 16/h and leak has been high. The patient's weight 196 pounds with a height of 62 (inches), resulting in a BMI of 36.1 kg/m2. The patient's neck circumference measured 14 inches.  CURRENT MEDICATIONS: Xanax, Vitamin D, cyanocobalamin, Aricept, ferrous sulfate, Synthroid, Zestril, Multivitamin, Crestor, Zoloft, Maxzide, Vitamin E.  PROCEDURE:  This is a multichannel digital polysomnogram utilizing the SomnoStar 11.2 system.  Electrodes and sensors were applied and monitored per AASM Specifications.   EEG, EOG, Chin and Limb EMG, were sampled at 200 Hz.  ECG, Snore and Nasal Pressure, Thermal Airflow, Respiratory Effort, CPAP Flow and Pressure, Oximetry was sampled at 50 Hz. Digital video and audio were recorded.      The patient started using her usual Dreamwear FFM, but was switched to a small Simplus FFM for a better fit. CPAP was initiated at 5 cmH20 with heated humidity per AASM standards and pressure was advanced to 11 cmH20 because of hypopneas, apneas and desaturations.  At a PAP pressure of 11 cmH20, there was a reduction of the AHI to 2.4/hour with supine NREM sleep achieved and O2 nadir of 90%.   Lights Out was at 20:45 and Lights On at 04:58. Total recording time (TRT) was 494 minutes, with a total sleep time (TST) of 443.5 minutes. The patient's sleep latency was 20.5 minutes. REM latency was 188.5 minutes.  The sleep efficiency was 89.8 %.    SLEEP ARCHITECTURE: WASO (Wake after sleep onset)  was 45 minutes with one longer period of wakefulness. There were 6 minutes in Stage N1, 323 minutes Stage N2, 75 minutes Stage N3 and 39.5 minutes in Stage REM.  The percentage of Stage N1 was 1.4%, Stage N2 was 72.8%, which is increased, Stage N3 was 16.9%, which is normal and Stage R (REM sleep) was 8.9%, which is reduced. The arousals were noted as: 66 were spontaneous, 22 were associated with PLMs, 4 were associated with respiratory events.  RESPIRATORY ANALYSIS:  There was a total of 17 respiratory events: 7 obstructive apneas, 6 central apneas and 0 mixed apneas with a total of 13 apneas and an apnea index (AHI) of 1.8 /hour. There were 4 hypopneas with a hypopnea index of .5/hour. The patient also had 0 respiratory event related arousals (RERAs).      The total APNEA/HYPOPNEA INDEX  (AHI) was 2.3 /hour and the total RESPIRATORY DISTURBANCE INDEX was 2.3 /hour  0 events occurred in REM sleep and 17 events in NREM. The REM AHI was 0 /hour versus a non-REM AHI of 2.5 /hour.  The patient spent 325.5 minutes of total sleep time in the supine position and 118 minutes in non-supine. The supine AHI was 2.4, versus a non-supine AHI of 2.0.  OXYGEN SATURATION & C02:  The baseline 02 saturation was 92%, with the lowest being 89%. Time spent below 89% saturation equaled 0 minutes.  PERIODIC LIMB MOVEMENTS:  The patient had a total of 90 Periodic Limb Movements. The Periodic Limb Movement (PLM)  index was 12.2 and the PLM Arousal index was 3. /hour.  Audio and video analysis did not show any abnormal or unusual movements, behaviors, phonations or vocalizations. The patient took no bathroom breaks. The EKG was in keeping with normal sinus rhythm.  Post-study, the patient indicated that sleep was better than usual.   DIAGNOSIS:  1. Obstructive Sleep Apnea   PLANS/RECOMMENDATIONS: 1. This study demonstrates resolution of the patient's obstructive sleep apnea with CPAP therapy. I will, therefore, start the  patient on home CPAP treatment at a pressure of 11 cm via small Simplus FFM with heated humidity. The patient shouldbe reminded to be fully compliant with PAP therapy to improve sleep related symptoms and decrease long term cardiovascular risks. The patient should be reminded, that it may take up to 3 months to get fully used to using PAP with all planned sleep. The earlier full compliance is achieved, the better long term compliance tends to be. Please note that untreated obstructive sleep apnea may carry additional perioperative morbidity. Patients with significant obstructive sleep apnea should receive perioperative PAP therapy and the surgeons and particularly the anesthesiologist should be informed of the diagnosis and the severity of the sleep disordered breathing. 2. The patient should be cautioned not to drive, work at heights, or operate dangerous or heavy equipment when tired or sleepy. Review and reiteration of good sleep hygiene measures should be pursued with any patient. 3. The patient will be seen in follow-up in the sleep clinic at [facility] for discussion of the test results, symptom and treatment compliance review, further management strategies, etc. The referring provider will be notified of the test results.  Huston Foley, MD, PhD Diplomat, American Board of Neurology and Sleep Medicine (Neurology and Sleep Medicine)

## 2019-05-19 NOTE — Progress Notes (Signed)
Patient had a CPAP titration study on 05/10/19 to help optimize her treatment. She has been having difficulty with autoPAP, last seen on 03/31/19.  Please call and inform patient that I have entered an order for treatment with positive airway pressure (PAP) treatment for obstructive sleep apnea (OSA). She did well during the latest sleep study with CPAP. We will, therefore, arrange for a machine for home use through a DME (durable medical equipment) company of Her choice; and I will see the patient back in follow-up in about 10 weeks. Please also explain to the patient that I will be looking out for compliance data, which can be downloaded from the machine (stored on an SD card, that is inserted in the machine) or via remote access through a modem, that is built into the machine. At the time of the followup appointment we will discuss sleep study results and how it is going with PAP treatment at home. Please advise patient to bring Her machine at the time of the first FU visit, even though this is cumbersome. Bringing the machine for every visit after that will likely not be needed, but often helps for the first visit to troubleshoot if needed. Please re-enforce the importance of compliance with treatment and the need for [place] to monitor compliance data - often an insurance requirement and actually good feedback for the patient as far as how they are doing.  Also remind patient, that any interim PAP machine or mask issues should be first addressed with the DME company, as they can often help better with technical and mask fit issues. Please ask if patient has a preference regarding DME company.  Please also make sure, the patient has a follow-up appointment with me in about 10 weeks from the setup date, thanks. May see one of our nurse practitioners if needed for proper timing of the FU appointment.  Please fax or rout report to the referring provider. Thanks,   Huston Foley, MD, PhD [facility])

## 2019-05-19 NOTE — Telephone Encounter (Signed)
I reached out to the pt's husband and we discussed sleep study result. He verbalized understanding and advised pt will continue to use [employer] for DME. He is going to up date pt on these results and pt will call back if she has any questions.  F/u has been made for 08/22/2019 for initial f/u appointment.   I advised pt's husband we would send a letter to the pt including the details of this call.

## 2019-05-19 NOTE — Telephone Encounter (Signed)
-----   Message from Huston Foley, MD sent at 05/19/2019  8:54 AM EST ----- Patient had a CPAP titration study on 05/10/19 to help optimize her treatment. She has been having difficulty with autoPAP, last seen on 03/31/19.  Please call and inform patient that I have entered an order for treatment with positive airway pressure (PAP) treatment for obstructive sleep apnea (OSA). She did well during the latest sleep study with CPAP. We will, therefore, arrange for a machine for home use through a DME (durable medical equipment) company of Her choice; and I will see the patient back in follow-up in about 10 weeks. Please also explain to the patient that I will be looking out for compliance data, which can be downloaded from the machine (stored on an SD card, that is inserted in the machine) or via remote access through a modem, that is built into the machine. At the time of the followup appointment we will discuss sleep study results and how it is going with PAP treatment at home. Please advise patient to bring Her machine at the time of the first FU visit, even though this is cumbersome. Bringing the machine for every visit after that will likely not be needed, but often helps for the first visit to troubleshoot if needed. Please re-enforce the importance of compliance with treatment and the need for [place] to monitor compliance data - often an insurance requirement and actually good feedback for the patient as far as how they are doing.  Also remind patient, that any interim PAP machine or mask issues should be first addressed with the DME company, as they can often help better with technical and mask fit issues. Please ask if patient has a preference regarding DME company.  Please also make sure, the patient has a follow-up appointment with me in about 10 weeks from the setup date, thanks. May see one of our nurse practitioners if needed for proper timing of the FU appointment.  Please fax or rout report to the referring  provider. Thanks,   Huston Foley, MD, PhD [facility])

## 2019-05-19 NOTE — Addendum Note (Signed)
Addended by: Huston Foley on: 05/19/2019 08:54 AM   Modules accepted: Orders

## 2019-06-28 ENCOUNTER — Other Ambulatory Visit: Payer: Self-pay

## 2019-06-28 ENCOUNTER — Other Ambulatory Visit: Payer: Medicare Other | Admitting: Internal Medicine

## 2019-06-28 DIAGNOSIS — E039 Hypothyroidism, unspecified: Secondary | ICD-10-CM

## 2019-06-28 DIAGNOSIS — E7800 Pure hypercholesterolemia, unspecified: Secondary | ICD-10-CM | POA: Diagnosis not present

## 2019-06-28 DIAGNOSIS — E2839 Other primary ovarian failure: Secondary | ICD-10-CM | POA: Diagnosis not present

## 2019-06-29 LAB — COMPLETE METABOLIC PANEL WITH GFR
AG Ratio: 0.9 (calc) — ABNORMAL LOW (ref 1.0–2.5)
ALT: 14 U/L (ref 6–29)
AST: 15 U/L (ref 10–35)
Albumin: 3.8 g/dL (ref 3.6–5.1)
Alkaline phosphatase (APISO): 107 U/L (ref 37–153)
BUN: 14 mg/dL (ref 7–25)
CO2: 25 mmol/L (ref 20–32)
Calcium: 9.5 mg/dL (ref 8.6–10.4)
Chloride: 107 mmol/L (ref 98–110)
Creat: 0.6 mg/dL (ref 0.50–0.99)
GFR, Est African American: 109 mL/min/{1.73_m2} (ref >=60)
GFR, Est Non African American: 94 mL/min/{1.73_m2} (ref >=60)
Globulin: 4.1 g/dL (calc) — ABNORMAL HIGH (ref 1.9–3.7)
Glucose, Bld: 99 mg/dL (ref 65–99)
Potassium: 3.9 mmol/L (ref 3.5–5.3)
Sodium: 138 mmol/L (ref 135–146)
Total Bilirubin: 0.5 mg/dL (ref 0.2–1.2)
Total Protein: 7.9 g/dL (ref 6.1–8.1)

## 2019-06-29 LAB — LIPID PANEL
Cholesterol: 239 mg/dL — ABNORMAL HIGH (ref <200)
HDL: 51 mg/dL (ref >=50)
LDL Cholesterol (Calc): 171 mg/dL (calc) — ABNORMAL HIGH
Non-HDL Cholesterol (Calc): 188 mg/dL (calc) — ABNORMAL HIGH (ref <130)
Total CHOL/HDL Ratio: 4.7 (calc) (ref <5.0)
Triglycerides: 70 mg/dL (ref <150)

## 2019-06-29 LAB — TSH: TSH: 3.9 mIU/L (ref 0.40–4.50)

## 2019-06-29 LAB — HEPATIC FUNCTION PANEL
AG Ratio: 0.9 (calc) — ABNORMAL LOW (ref 1.0–2.5)
ALT: 14 U/L (ref 6–29)
AST: 15 U/L (ref 10–35)
Albumin: 3.8 g/dL (ref 3.6–5.1)
Alkaline phosphatase (APISO): 107 U/L (ref 37–153)
Bilirubin, Direct: 0.1 mg/dL (ref 0.0–0.2)
Globulin: 4.1 g/dL (calc) — ABNORMAL HIGH (ref 1.9–3.7)
Indirect Bilirubin: 0.4 mg/dL (calc) (ref 0.2–1.2)
Total Bilirubin: 0.5 mg/dL (ref 0.2–1.2)
Total Protein: 7.9 g/dL (ref 6.1–8.1)

## 2019-06-30 ENCOUNTER — Encounter: Payer: Self-pay | Admitting: Internal Medicine

## 2019-06-30 ENCOUNTER — Other Ambulatory Visit: Payer: Self-pay

## 2019-06-30 ENCOUNTER — Ambulatory Visit (INDEPENDENT_AMBULATORY_CARE_PROVIDER_SITE_OTHER): Payer: Medicare Other | Admitting: Internal Medicine

## 2019-06-30 VITALS — BP 130/80 | HR 66 | Temp 98.0°F | Ht 62.0 in | Wt 197.0 lb

## 2019-06-30 DIAGNOSIS — I10 Essential (primary) hypertension: Secondary | ICD-10-CM | POA: Diagnosis not present

## 2019-06-30 DIAGNOSIS — E7800 Pure hypercholesterolemia, unspecified: Secondary | ICD-10-CM

## 2019-06-30 DIAGNOSIS — E039 Hypothyroidism, unspecified: Secondary | ICD-10-CM | POA: Diagnosis not present

## 2019-06-30 DIAGNOSIS — R413 Other amnesia: Secondary | ICD-10-CM

## 2019-06-30 DIAGNOSIS — R7302 Impaired glucose tolerance (oral): Secondary | ICD-10-CM | POA: Diagnosis not present

## 2019-06-30 DIAGNOSIS — Z6835 Body mass index (BMI) 35.0-35.9, adult: Secondary | ICD-10-CM | POA: Diagnosis not present

## 2019-06-30 MED ORDER — ROSUVASTATIN CALCIUM 5 MG PO TABS: 5.0000 mg | ORAL_TABLET | Freq: Every day | ORAL | 3 refills | Status: DC

## 2019-06-30 NOTE — Progress Notes (Signed)
   Subjective:    Patient ID: [name], female    DOB: 1951-02-21, 69 y.o.   MRN: [id number]  HPI     Review of Systems     Objective:   Physical Exam Weight is up 2 pounds since September. She is alert.  Memory not tested today.  Chest clear to auscultation.  No thyromegaly.  No carotid bruits.  2/6 systolic ejection murmur unchanged.  No lower extremity edema.      Assessment & Plan:  Memory loss followed by neurologist and neuropsychologist treated with Aricept  Hypothyroidism treated with low-dose Synthroid  Anxiety treated with Xanax twice daily as needed  History of depression treated with Zoloft 100 mg daily  Hyperlipidemia-Take Crestor 5 mg daily.  Total cholesterol was 239 and was 197 in September.  LDL in September was 124 and is now 171.  Follow-up in May  Elevated globulin at 4.1-will have SPEP with next lab draw  Diagnosis of sleep apnea in January change patient advised to use CPAP device  Essential hypertension stable on Maxide 25 the blood pressure 130/80  BMI 36.03-encourage diet exercise and weight loss.  Used to go to the gym regularly and is hoping to get back to that soon.  Plan: Follow-up of lipid management in May. Check SPEP at that time.

## 2019-06-30 NOTE — Patient Instructions (Addendum)
Crestor has been refilled.  Follow-up in 6 to 8 weeks with repeat lipid panel.  Will check SPEP with elevated protein level at that time.  Take Crestor once weekly.  Continue other medications as previously prescribed.

## 2019-07-20 ENCOUNTER — Encounter: Payer: Self-pay | Admitting: Internal Medicine

## 2019-08-22 ENCOUNTER — Encounter: Payer: Self-pay | Admitting: Neurology

## 2019-08-22 ENCOUNTER — Other Ambulatory Visit: Payer: Self-pay

## 2019-08-22 ENCOUNTER — Ambulatory Visit (INDEPENDENT_AMBULATORY_CARE_PROVIDER_SITE_OTHER): Payer: Medicare Other | Admitting: Neurology

## 2019-08-22 VITALS — BP 125/80 | HR 65 | Temp 97.5°F | Ht 62.0 in | Wt 189.3 lb

## 2019-08-22 DIAGNOSIS — Z9989 Dependence on other enabling machines and devices: Secondary | ICD-10-CM | POA: Diagnosis not present

## 2019-08-22 DIAGNOSIS — R413 Other amnesia: Secondary | ICD-10-CM | POA: Diagnosis not present

## 2019-08-22 DIAGNOSIS — G4733 Obstructive sleep apnea (adult) (pediatric): Secondary | ICD-10-CM | POA: Diagnosis not present

## 2019-08-22 NOTE — Patient Instructions (Addendum)
Please continue using your CPAP regularly. While your insurance requires that you use CPAP at least 4 hours each night on 70% of the nights, I recommend, that you not skip any nights and use it throughout the night if you can. Getting used to CPAP and staying with the treatment long term does take time and patience and discipline. Untreated obstructive sleep apnea when it is moderate to severe can have an adverse impact on cardiovascular health and raise her risk for heart disease, arrhythmias, hypertension, congestive heart failure, stroke and diabetes. Untreated obstructive sleep apnea causes sleep disruption, nonrestorative sleep, and sleep deprivation. This can have an impact on your day to day functioning and cause daytime sleepiness and impairment of cognitive function, memory loss, mood disturbance, and problems focussing. Using CPAP regularly can improve these symptoms.  Please continue to try to hydrate well and try to exercise on a regular basis.  Follow-up in about 6 months, we will recheck your memory scores at the time.  Please call us with any interim questions or concerns.  Your sleep apnea is under better control on CPAP therapy as opposed to the AutoPap setting.  Your mask fit is better lately.

## 2019-08-22 NOTE — Progress Notes (Signed)
Subjective:    Patient ID: [name] is a 69 y.o. female.  HPI     Interim history:    [name] is a 69 year old right-handed woman with an underlying medical history of hypertention, hyperlipidemia, anemia, prediabetes, sleep apnea, and obesity, who presents for follow-up consultation of her memory loss, her obstructive sleep apnea.  The patient is accompanied by her husband today.  I last saw her on 03/31/19, At which time her residual apnea scores were around 16, her leak was rather high, and I suggested we bring her in for proper titration study.  She had an interim CPAP titration study on 05/10/2019, during which time she did well with CPAP of 11 cm, AHI was 2.4/h, supine non-REM sleep was achieved an O2 nadir of 90%.  She started CPAP therapy at 11 cm in the beginning of February.  Today, 08/22/2019: I reviewed her CPAP compliance data for the past 87 days, she used her CPAP only 30 days, percent use days greater than 4 hours was 20%.  In the first month after starting CPAP in the beginning of February through beginning of March, her compliance for more than 4 hours was a little better at 29%.  Residual AHI is at goal, an average of 1.2/h in the past nearly 90 days.  Leak has improved significantly since the beginning of March, since she is switched to a different mask per husband's report.  She reports that she does not take her machine with her when she is out of town.  He reports that she may skip her machine even when she is at home.  He tries to remind her and is also encouraging her to drink more water.  She has had no recent falls, reports some arthritic pain in both ankles.  Her primary care physician start donepezil low-dose.  She reports that she continues to take it.     She has a follow-up appointment with Dr. Sima Matas in July year.    The patient's allergies, current medications, family history, past medical history, past social history, past surgical history and problem  list were reviewed and updated as appropriate.    Previously:   I saw her on 12/30/2018, at which time we talked about her interim test results including her brain MRI result and sleep evaluation with her home sleep test showing moderate obstructive sleep apnea.  She was not fully compliant with AutoPap.  She was encouraged to be fully compliant.  She was also reminded to stay well-hydrated.  She was advised to make an appointment with her DME company for a mask refit.  She had interim consultation with Dr. Sima Matas in September and a follow-up appointment for discussion on 02/07/2019 and I reviewed the note: << Diagnosis:                                Axis I: Early onset Alzheimer's dementia without behavioral disturbance ([place])   Memory loss   Depression with anxiety   Obstructive sleep apnea syndrome   Metabolic syndrome>>    Reassessment in about 9 months was recommended by Dr. Sima Matas.     I reviewed her AutoPap compliance data from 02/28/2019 through 03/29/2019 which is a total of 30 days, during which time she used her machine 26 days with percent use days greater than 4 hours at 27%, indicating low compliance with an average usage of 3 hours, residual AHI elevated at 16.4/h  with primarily obstructive events, 95th percentile of pressure at 9.7 cm with a range of 7 cm to 13 cm with significant leak with the 95th percentile of 50.2 L/min and consistently high leak throughout.       I first met her on 05/31/2018 at the request of her primary care physician, Dr. Renold Genta, at which time the patient reported a prior diagnosis of mild cognitive impairment some for 5 years ago.  The patient reported forgetfulness and also stress as well as residual depression.  Her MMSE was 20 out of 30 at the time.  She was encouraged to seek optimization of her depressive symptoms and follow-up with primary care.  In the interim, I also suggested we proceed with neuropsychological referral for cognitive  testing, sleep study to rule out obstructive sleep apnea and a brain MRI.  She had a brain MRI with and without contrast on 06/30/2018 and I reviewed the results: IMPRESSION:    MRI brain (with and without) demonstrating: - Mild periventricular and subcortical foci of non-specific gliosis.  - No abnormal lesions are seen on post contrast views.   - No acute findings.   Her split-night sleep study was canceled in March 2020 due to the COVID-19 pandemic.  She was referred to neuropsychology, Dr. Sima Matas, but has not had her evaluation yet.   She had a home sleep test on 10/07/2018 which indicated moderate obstructive sleep apnea with an AHI of 24.4/h, and O2 nadir of 82%.  Given the COVID-19 pandemic she was advised to proceed with AutoPap therapy.   I reviewed her AutoPap compliance data from 11/29/2018 through 12/28/2018 which is a total of 30 days, during which time she used her machine 18 days only, with percent use days greater than 4 hours at 17% only, indicating significantly low compliance with an average usage of 3 hours and 14 minutes only, residual AHI elevated at 12.2/h, 95th percentile of pressure at 10.7 cm, leak very high with a 95th percentile at 47.7 L/min, pressure range of 7 cm to 13 cm with EPR.  Her set up date was 11/01/2018, her percent use days greater than 4 hours for the past nearly 60 days was about 31%.  Residual AHI for all days about 7.8/h, average pressure for the 95th percentile at 9.8 cm, but leak has been consistently high.    05/31/2018: She reports increased forgetfulness. Her husband reports that she was diagnosed with mild congnitive impairment some 4-5 years ago. There was no FU testing or treatment at the time. Her husband reports that she has been more forgetful, repetitive, asking the same questions over and over again. Her driving seems to be good. She has not gotten lost. They take turns with a drive up to [place].  She endorses stress and attributes some of her  forgetfulness to stress.  She reported sleeping fairly well. Sometimes she goes to bed late and sleeps in late. She does snore per husband's report. Her Epworth sleepiness score is 6 out of 24 today. I reviewed your office note from 05/25/2018. She had blood work through your office at the time which I reviewed. She had B12, folate, CBC with differential, CMP, ESR, RPR, TSH, free T4. B12 was elevated above 1000, folate normal, platelets slightly elevated at 410 CMP showed slightly elevated calcium and total protein, ESR mildly elevated at 65, RPR nonreactive, TSH and free T4 normal. She was advised to return for additional laboratory testing including parathyroid hormone and calcium.  She had a sleep  study 2008 and was diagnosed with sleep apnea. She has not been on CPAP therapy. She had seen Dr. Valentina Shaggy in December 2016 on 03/22/2015 and 03/30/2015 and had neuropsychological evaluation. She was diagnosed with mild cognitive impairment in the setting of depression. He recommended that she be restarted on antidepressant medication and consider counseling. She reports taking her pills regularly, but her husband is not so sure. She had b/l TKAs. She has no FHx of dementia. 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 Has 3 children, twin daughters in [place] and son in [place]. She admits that since her retirement she has become more withdrawn. She also adds that she is content being by herself. She has residual depression, and was supposed to increase the Zoloft. [facility] but not [facility]. She is a nonsmoker and drinks alcohol rarely, no daily caffeine, does drink occasional tea. She does not always hydrate well enough she admits, does not typically get 8 cups of water per day.    Her Past Medical History Is Significant For: Past Medical History:  Diagnosis Date  . Anemia   . Hyperlipidemia   . Hypertension   . Pre-diabetes   . Sleep apnea     Her Past Surgical History Is  Significant For: Past Surgical History:  Procedure Laterality Date  . GANGLION CYST EXCISION     l hand  . JOINT REPLACEMENT     knee right  . TUBAL LIGATION      Her Family History Is Significant For: Family History  Problem Relation Age of Onset  . Diabetes Mother   . Hypertension Mother   . Kidney disease Father   . Heart disease Father   . Stroke Sister     Her Social History Is Significant For: Social History   Socioeconomic History  . Marital status: Married    Spouse name: Not on file  . Number of children: Not on file  . Years of education: Not on file  . Highest education level: Not on file  Occupational History  . Not on file  Tobacco Use  . Smoking status: Never Smoker  . Smokeless tobacco: Never Used  Substance and Sexual Activity  . Alcohol use: Yes    Comment: rarely  . Drug use: No  . Sexual activity: Not on file  Other Topics Concern  . Not on file  Social History Narrative  . Not on file   Social Determinants of Health   Financial Resource Strain:   . Difficulty of Paying Living Expenses:   Food Insecurity:   . Worried About Charity fundraiser in the Last Year:   . Arboriculturist in the Last Year:   Transportation Needs:   . Film/video editor (Medical):   Marland Kitchen Lack of Transportation (Non-Medical):   Physical Activity:   . Days of Exercise per Week:   . Minutes of Exercise per Session:   Stress:   . Feeling of Stress :   Social Connections:   . Frequency of Communication with Friends and Family:   . Frequency of Social Gatherings with Friends and Family:   . Attends Religious Services:   . Active Member of Clubs or Organizations:   . Attends Archivist Meetings:   Marland Kitchen Marital Status:     Her Allergies Are:  No Known Allergies:   Her Current Medications Are:  Outpatient Encounter Medications as of 08/22/2019  Medication Sig  . ALPRAZolam (XANAX) 0.5  MG tablet TAKE ONE TABLET BY MOUTH TWICE A DAY AS NEEDED FOR ANXIETY   . Cholecalciferol (VITAMIN D) 1000 UNITS capsule Take 1,000 Units by mouth daily.    . cyanocobalamin 500 MCG tablet Take 500 mcg by mouth daily.  Marland Kitchen donepezil (ARICEPT) 5 MG tablet Take 1 tablet (5 mg total) by mouth at bedtime.  . ferrous sulfate 325 (65 FE) MG EC tablet Take 325 mg by mouth daily.   Marland Kitchen levothyroxine (SYNTHROID) 50 MCG tablet Take 1 tablet (50 mcg total) by mouth daily.  Marland Kitchen lisinopril (ZESTRIL) 10 MG tablet Take 1 tablet (10 mg total) by mouth daily.  . Multiple Vitamins-Minerals (MULTIVITAMIN WITH MINERALS) tablet Take 1 tablet by mouth daily.    . rosuvastatin (CRESTOR) 5 MG tablet Take 1 tablet (5 mg total) by mouth daily.  . sertraline (ZOLOFT) 100 MG tablet TAKE ONE TABLET BY MOUTH DAILY  . triamterene-hydrochlorothiazide (MAXZIDE-25) 37.5-25 MG tablet Take 1 tablet by mouth daily.  . vitamin E (VITAMIN E) 400 UNIT capsule Take 400 Units by mouth daily.   No facility-administered encounter medications on file as of 08/22/2019.  :  Review of Systems:  Out of a complete 14 point review of systems, all are reviewed and negative with the exception of these symptoms as listed below: Review of Systems  Neurological:       Here for f/u on cpap. Reports no compliants when she uses the machine but does not wear every night due to traveling with her husband.    Objective:  Neurological Exam  Physical Exam Physical Examination:   Vitals:   08/22/19 1219  BP: 125/80  Pulse: 65  Temp: (!) 97.5 F (36.4 C)    General Examination: The patient is a very pleasant 69 y.o. female in no acute distress. She appears well-developed and well-nourished and well groomed.   HEENT:Normocephalic, atraumatic, pupils are equal, round and reactive to light, extraocular tracking is good without limitation to gaze excursion or nystagmus noted. Normal smooth pursuit is noted. Hearing is grossly intact. Face is symmetric with normal facial animation and normal facial sensation. Speech is  clear with no dysarthria noted. There is no hypophonia. There is no lip, neck/head, jaw or voice tremor. Neckshows FROM.There are no carotid bruits on auscultation. Oropharynx exam reveals: moderatemouth dryness, gooddental hygiene and mildairway crowding. Tongue protrudes centrally and palate elevates symmetrically.   Chest:Clear to auscultation without wheezing, rhonchi or crackles noted.  VQXIH:W3+U8+8, mild systolic murmur noted, stable.  Abdomen:Soft, non-tender and non-distended with normal bowel sounds appreciated on auscultation.  Extremities:There isnopitting edema in the distal lower extremities bilaterally.   Skin: Warm and dry without trophic changes noted.  Musculoskeletal: exam reveals no obvious joint deformities, tenderness or joint swelling or erythema.   Neurologically:  Mental status: The patient is awake, alert and oriented in all 4 spheres.Herimmediate and remote memory, attention, language skills and fund of knowledge are fairly good, but her husband supplements Hx. There is no evidence of aphasia, agnosia, apraxia or anomia. Speech is clear normal prosody and enunciation. Thought process is linear. Mood isokay, affect somewhatblunted.  Cranial nerves II - XII are as described above under HEENT exam.  Motor exam: Normal bulk, strength and tone is noted. There is notremor.   On2/01/2019: MMSE: 20/30 but variable effort noted, pt reported feeling stressed, CDT: 4/4, AFP: 10/min.  On9/01/2019: MMSE: 19/30, CDT: 4/4, AFT 12/min. Better effort noted by nurse.  Reflexes are1+ throughout. Fine motor skills and coordination:grosslyintact. Cerebellar testing: No dysmetria  or intention tremor. There is no truncal or gait ataxia.  Sensory exam: intact to light touch in the upper and lower extremities.  Gait, station and balance:Shestands easily. No veering to one side is noted. No leaning to one side is noted. Posture is age-appropriate and stance  is narrow based. Gait showsnormalstride length and normalpace. No problems turning are noted.  Assessmentand Plan:   In summary,Ovie T Simmsis a55 year oldfemalewith an underlying medical history of hypertention, hyperlipidemia, anemia, prediabetes, sleep apnea, and obesity, whopresents forFollow-up consultation of her memory loss and sleep apnea. Her neuropsychological evaluation in 2020 was supportive of a diagnosis of early Alzheimer's disease. Reevaluation in about 9 months was recommended and she is scheduled to see the neuropsychologist in July of this year.  She has vascular risk factors.  Brain MRI from March 2020 showed mild vascular/white matter changes, no obvious atrophy, no acute findings thankfully. She had a home sleep test in June 2020 which indicated moderate obstructive sleep apnea. She had been on AutoPap therapy since mid July 2020, but her sleep apnea was not under full control, she was struggling with the usage with AutoPap and her leak was consistently high. She had an interim CPAP titration study in January 2021 and since early February 2021 she has been on CPAP of 11 cm.  Her compliance is suboptimal, apnea control is better and leak has improved since a mask change in early March 2021.  She is encouraged to be fully compliant with her CPAP therapy.  She is advised that we will follow-up routinely in 6 months, to see the nurse practitioner, sooner if needed, we will recheck her MMSE next time.  She had been started on donepezil by her primary care physician last year. I answered all their questions today and the patient and her husband were in agreement.

## 2019-08-25 ENCOUNTER — Other Ambulatory Visit: Payer: Self-pay

## 2019-08-25 ENCOUNTER — Other Ambulatory Visit: Payer: Medicare Other | Admitting: Internal Medicine

## 2019-08-25 DIAGNOSIS — R779 Abnormality of plasma protein, unspecified: Secondary | ICD-10-CM

## 2019-08-25 DIAGNOSIS — E7800 Pure hypercholesterolemia, unspecified: Secondary | ICD-10-CM | POA: Diagnosis not present

## 2019-08-29 LAB — PROTEIN ELECTROPHORESIS, SERUM
Albumin ELP: 3.7 g/dL — ABNORMAL LOW (ref 3.8–4.8)
Alpha 1: 0.4 g/dL — ABNORMAL HIGH (ref 0.2–0.3)
Alpha 2: 0.8 g/dL (ref 0.5–0.9)
Beta 2: 0.6 g/dL — ABNORMAL HIGH (ref 0.2–0.5)
Beta Globulin: 0.5 g/dL (ref 0.4–0.6)
Gamma Globulin: 2 g/dL — ABNORMAL HIGH (ref 0.8–1.7)
Total Protein: 8 g/dL (ref 6.1–8.1)

## 2019-08-29 LAB — LIPID PANEL
Cholesterol: 247 mg/dL — ABNORMAL HIGH (ref <200)
HDL: 47 mg/dL — ABNORMAL LOW (ref >=50)
LDL Cholesterol (Calc): 182 mg/dL (calc) — ABNORMAL HIGH
Non-HDL Cholesterol (Calc): 200 mg/dL (calc) — ABNORMAL HIGH (ref <130)
Total CHOL/HDL Ratio: 5.3 (calc) — ABNORMAL HIGH (ref <5.0)
Triglycerides: 78 mg/dL (ref <150)

## 2019-08-30 ENCOUNTER — Telehealth: Payer: Self-pay | Admitting: Internal Medicine

## 2019-08-30 ENCOUNTER — Ambulatory Visit: Payer: Medicare Other | Admitting: Internal Medicine

## 2019-08-30 NOTE — Telephone Encounter (Signed)
Tried to call pt about missed appt today to see if we could reschedule for next week, LVM for pt to call back

## 2019-09-06 ENCOUNTER — Ambulatory Visit: Payer: Medicare Other | Admitting: Internal Medicine

## 2019-09-14 ENCOUNTER — Encounter: Payer: Self-pay | Admitting: Internal Medicine

## 2019-09-14 ENCOUNTER — Other Ambulatory Visit: Payer: Self-pay

## 2019-09-14 ENCOUNTER — Ambulatory Visit (INDEPENDENT_AMBULATORY_CARE_PROVIDER_SITE_OTHER): Payer: Medicare Other | Admitting: Internal Medicine

## 2019-09-14 VITALS — BP 120/88 | HR 81 | Ht 62.0 in | Wt 183.0 lb

## 2019-09-14 DIAGNOSIS — I10 Essential (primary) hypertension: Secondary | ICD-10-CM | POA: Diagnosis not present

## 2019-09-14 DIAGNOSIS — Z6833 Body mass index (BMI) 33.0-33.9, adult: Secondary | ICD-10-CM | POA: Diagnosis not present

## 2019-09-14 DIAGNOSIS — E039 Hypothyroidism, unspecified: Secondary | ICD-10-CM | POA: Diagnosis not present

## 2019-09-14 DIAGNOSIS — E7800 Pure hypercholesterolemia, unspecified: Secondary | ICD-10-CM

## 2019-09-14 DIAGNOSIS — R413 Other amnesia: Secondary | ICD-10-CM | POA: Diagnosis not present

## 2019-09-14 DIAGNOSIS — Z8659 Personal history of other mental and behavioral disorders: Secondary | ICD-10-CM | POA: Diagnosis not present

## 2019-09-14 DIAGNOSIS — R7302 Impaired glucose tolerance (oral): Secondary | ICD-10-CM | POA: Diagnosis not present

## 2019-09-14 DIAGNOSIS — G4733 Obstructive sleep apnea (adult) (pediatric): Secondary | ICD-10-CM | POA: Diagnosis not present

## 2019-09-14 MED ORDER — DONEPEZIL HCL 10 MG PO TABS: 10.0000 mg | ORAL_TABLET | Freq: Every day | ORAL | 1 refills | Status: DC

## 2019-09-14 NOTE — Progress Notes (Signed)
Subjective:    Patient ID: [name], female    DOB: 1950/07/12, 69 y.o.   MRN: [id number]  HPI 69 year old Female seen today after missing 2 previous appointments. She is accompanied by her husband, Genevie Cheshire, who is supportive of her. He realizes she has some memory issues. Patient says she did not realize she had these appointments.  Tells me there has been some stress. They lost their dog recently. That has been very hard for both of them.  They hope to travel some the summer to see their children. They say the girls are doing well.  I took a considerable amount of time and went over her medications with both patient and her husband. He now has a list of her current medications. Apparently there is some confusion there with what she is supposed to be taking.Marland Kitchen He reports she has been anxious at times. He has tried putting the pills in a weekly pill container. She has been on Aricept 5 mg daily but I am going to increase that to 10 mg daily. She has an appointment for neuropsychological follow-up in July. I will see her in August for her regular health maintenance exam and she has an appointment with neurology in November. I  may consider Namenda at next visit in August as well.  She knows that Trump was the previous president. Not clear on day of week.  Clyde told me previously that patient had mother with memory disorder.  She is on levothyroxine, Zestril, Crestor, Zoloft, Maxide 25. Has Xanax to take twice daily as needed for anxiety. TSH was checked in March and was normal at 3.90.  Lipid panel in May showed total cholesterol of 247, HDL 47 and LDL 182. He indicated that he had not been eating a low-fat diet and not exercising quite as much. Have not changed dose of Crestor at the present time. Reevaluate in August.  Has been diagnosed with early Alzheimer's dementia by Neurologist.  An SPEP was also done recently May 6 because of elevated globulin at 4.1 on 2 separate testings. No  abnormal protein bands were seen.  Review of Systems see Alphonzo Cruise has a history of sleep apnea and needs to get proper apparatus for that device. Seen by neurologist early May. Had titration study January 2021. CPAP set at 11 cm. Apparently had only used CPAP for 30 days out of 87. Has received a different mask sometime in March.     Objective:   Physical Exam Blood pressure 120/88 pulse 81 pulse oximetry 95% weight 183 pounds BMI 33.47 She is pleasant and cooperative although it is clear that she is forgetful. Not able to do serial sevens. Not able to remember 3 items after 5 minutes. Not clear on day of week or year.  Skin warm and dry. No cervical adenopathy. No thyromegaly. Chest clear to auscultation. Cardiac exam regular rate and rhythm normal S1 and S2. Extremities without edema.       Assessment & Plan:  Memory loss-has been diagnosed with early Alzheimer's dementia by neurologist and I would concur with this diagnosis Increasing Aricept from 5 to 10 mg daily. Follow-up with Dr. Kieth Brightly in July and I will see her in August for health maintenance exam and reevaluate at that time. Consider adding Namenda.  Essential hypertension-stable at the present time  Hyperlipidemia-not following a strict low-fat diet may not be compliant with lipid-lowering medication. Recheck in August.  Anxiety and depression treated with Xanax and Zoloft  Grief  reaction-has lost a dog recently.  History of sleep apnea-needs to wear CPAP device  Hypothyroidism TSH checked in March and was normal.  Plan: Return in August and continue current medications. Increasing Aricept from 5 to 10 mg daily.I went over detailed list of medications with husband  3 times today.

## 2019-09-14 NOTE — Patient Instructions (Addendum)
Meds reviewed with husband and patient in detail. Increase Aricept to 10 mg daily. CPE scheduled for August 2. Keep appt with Dr. Kieth Brightly.

## 2019-09-17 ENCOUNTER — Encounter: Payer: Self-pay | Admitting: Internal Medicine

## 2019-10-20 ENCOUNTER — Other Ambulatory Visit: Payer: Medicare Other | Admitting: Internal Medicine

## 2019-10-28 ENCOUNTER — Other Ambulatory Visit: Payer: Self-pay

## 2019-10-28 ENCOUNTER — Other Ambulatory Visit: Payer: Medicare Other | Admitting: Internal Medicine

## 2019-10-28 DIAGNOSIS — E7800 Pure hypercholesterolemia, unspecified: Secondary | ICD-10-CM

## 2019-10-28 LAB — LIPID PANEL
Cholesterol: 220 mg/dL — ABNORMAL HIGH (ref <200)
HDL: 47 mg/dL — ABNORMAL LOW (ref >=50)
LDL Cholesterol (Calc): 155 mg/dL (calc) — ABNORMAL HIGH
Non-HDL Cholesterol (Calc): 173 mg/dL (calc) — ABNORMAL HIGH (ref <130)
Total CHOL/HDL Ratio: 4.7 (calc) (ref <5.0)
Triglycerides: 75 mg/dL (ref <150)

## 2019-11-08 ENCOUNTER — Ambulatory Visit: Payer: Medicare Other | Admitting: Psychology

## 2019-11-14 ENCOUNTER — Telehealth: Payer: Self-pay

## 2019-11-14 NOTE — Telephone Encounter (Signed)
Patient called has joint pain knees, ankles and hands it's been going for years, per Dr. Lenord Fellers needs appt to discuss.

## 2019-11-15 ENCOUNTER — Encounter: Payer: Self-pay | Admitting: Internal Medicine

## 2019-11-15 ENCOUNTER — Other Ambulatory Visit: Payer: Self-pay

## 2019-11-15 ENCOUNTER — Ambulatory Visit (INDEPENDENT_AMBULATORY_CARE_PROVIDER_SITE_OTHER): Payer: Medicare Other | Admitting: Internal Medicine

## 2019-11-15 ENCOUNTER — Other Ambulatory Visit: Payer: Self-pay | Admitting: Internal Medicine

## 2019-11-15 VITALS — BP 110/68 | HR 88 | Temp 98.0°F | Ht 62.0 in | Wt 174.0 lb

## 2019-11-15 DIAGNOSIS — E7800 Pure hypercholesterolemia, unspecified: Secondary | ICD-10-CM

## 2019-11-15 DIAGNOSIS — R700 Elevated erythrocyte sedimentation rate: Secondary | ICD-10-CM | POA: Diagnosis not present

## 2019-11-15 DIAGNOSIS — E039 Hypothyroidism, unspecified: Secondary | ICD-10-CM

## 2019-11-15 DIAGNOSIS — M791 Myalgia, unspecified site: Secondary | ICD-10-CM

## 2019-11-15 DIAGNOSIS — Z8659 Personal history of other mental and behavioral disorders: Secondary | ICD-10-CM | POA: Diagnosis not present

## 2019-11-15 DIAGNOSIS — G4733 Obstructive sleep apnea (adult) (pediatric): Secondary | ICD-10-CM | POA: Diagnosis not present

## 2019-11-15 DIAGNOSIS — I10 Essential (primary) hypertension: Secondary | ICD-10-CM | POA: Diagnosis not present

## 2019-11-15 DIAGNOSIS — R413 Other amnesia: Secondary | ICD-10-CM

## 2019-11-15 DIAGNOSIS — R7302 Impaired glucose tolerance (oral): Secondary | ICD-10-CM | POA: Diagnosis not present

## 2019-11-15 DIAGNOSIS — M2550 Pain in unspecified joint: Secondary | ICD-10-CM

## 2019-11-15 MED ORDER — LEVOTHYROXINE SODIUM 50 MCG PO TABS: 50.0000 ug | ORAL_TABLET | Freq: Every day | ORAL | 3 refills | Status: DC

## 2019-11-15 MED ORDER — MELOXICAM 15 MG PO TABS
15.0000 mg | ORAL_TABLET | Freq: Every day | ORAL | 0 refills | Status: DC
Start: 2019-11-15 — End: 2022-12-04

## 2019-11-15 NOTE — Progress Notes (Signed)
   Subjective:    Patient ID: [name], female    DOB: Nov 18, 1950, 69 y.o.   MRN: [id number]  HPI 69 year old Female with history of memory loss here to discuss joint pain in hands and knees. No issue with ankles and wrists. Takes prn Aleve with some relief. Husband has been concerned. Asked for her to see specialist but we needed to get baseline labs first.   Never had rheumatic fever. Reports joint pain as a child that were sever and she would cry herself to sleep.  Take Crestor 5 mg daily.Hx hypothyroidism. Hx HTN. 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 Has memory loss issues treated with Aricept and followed by Neurology.Hx HTN, hypothyroidism and hyperlipidemia.History of anxiety. Hx of sleep apnea.History of impaired glucose tolerance.  Hx bilateral TKAs. These were performed by Dr. Cleophas Dunker in February 2010 in December 2010.  See CPE and medicare wellness visit July 2020 for additional info.  Dx by podiatrist with primary osteoarthritis and tenosynovitis right ankle and foot in 2019.  She has had two Covid 19 immunizations.    Review of Systems see above     Objective:   Physical Exam Blood pressure 110/68 pulse 78 temperature 98 degrees orally pulse oximetry 96% weight 174 pounds BMI 31.83  She has bilateral TKA scars both knees. Joints are not warm to touch. TM joints are not tender. Shoulder joints not particularly tender to palpation. Elbow joints not inflamed or tender. Wrist joints not inflamed or tender. Has Heberden's and Bouchard's nodes both hands consistent with osteoarthritis. Ankles are not swollen or red.       Assessment & Plan:  Patient complaining of aching and arthralgias. Have drawn uric acid level, TSH, total CK, sed rate, rheumatoid factor, CCP, ANA. She has been prescribed Mobic 15 mg daily pending results  Addendum: Sed rate is elevated at 130. Waiting for other rheumatology studies to result.  She is being  referred to [facility]umatology for possible polymyalgia rheumatica. Husband  will accompany her to this appointment. She has Medicare wellness and health maintenance exam scheduled for early August here.

## 2019-11-15 NOTE — Patient Instructions (Signed)
Restart levothyroxine. Says she has not been taking it. Meloxicam 15 mg daily. Labs drawn for arthralgias. Will notify patient of results of these rheumatology studies.

## 2019-11-17 ENCOUNTER — Other Ambulatory Visit: Payer: Self-pay

## 2019-11-17 ENCOUNTER — Other Ambulatory Visit: Payer: Medicare Other | Admitting: Internal Medicine

## 2019-11-17 DIAGNOSIS — E7800 Pure hypercholesterolemia, unspecified: Secondary | ICD-10-CM

## 2019-11-17 DIAGNOSIS — M2550 Pain in unspecified joint: Secondary | ICD-10-CM | POA: Diagnosis not present

## 2019-11-17 DIAGNOSIS — R7302 Impaired glucose tolerance (oral): Secondary | ICD-10-CM | POA: Diagnosis not present

## 2019-11-17 DIAGNOSIS — Z0000 Encounter for general adult medical examination without abnormal findings: Secondary | ICD-10-CM | POA: Diagnosis not present

## 2019-11-17 DIAGNOSIS — E039 Hypothyroidism, unspecified: Secondary | ICD-10-CM

## 2019-11-17 DIAGNOSIS — D649 Anemia, unspecified: Secondary | ICD-10-CM

## 2019-11-17 DIAGNOSIS — G4733 Obstructive sleep apnea (adult) (pediatric): Secondary | ICD-10-CM | POA: Diagnosis not present

## 2019-11-17 DIAGNOSIS — R413 Other amnesia: Secondary | ICD-10-CM | POA: Diagnosis not present

## 2019-11-17 DIAGNOSIS — I10 Essential (primary) hypertension: Secondary | ICD-10-CM

## 2019-11-18 LAB — CBC WITH DIFFERENTIAL/PLATELET
Absolute Monocytes: 789 cells/uL (ref 200–950)
Basophils Absolute: 35 cells/uL (ref 0–200)
Basophils Relative: 0.3 %
Eosinophils Absolute: 93 cells/uL (ref 15–500)
Eosinophils Relative: 0.8 %
HCT: 32.6 % — ABNORMAL LOW (ref 35.0–45.0)
Hemoglobin: 10.1 g/dL — ABNORMAL LOW (ref 11.7–15.5)
Lymphs Abs: 2575 cells/uL (ref 850–3900)
MCH: 26 pg — ABNORMAL LOW (ref 27.0–33.0)
MCHC: 31 g/dL — ABNORMAL LOW (ref 32.0–36.0)
MCV: 84 fL (ref 80.0–100.0)
MPV: 10.2 fL (ref 7.5–12.5)
Monocytes Relative: 6.8 %
Neutro Abs: 8108 cells/uL — ABNORMAL HIGH (ref 1500–7800)
Neutrophils Relative %: 69.9 %
Platelets: 607 10*3/uL — ABNORMAL HIGH (ref 140–400)
RBC: 3.88 10*6/uL (ref 3.80–5.10)
RDW: 14.1 % (ref 11.0–15.0)
Total Lymphocyte: 22.2 %
WBC: 11.6 10*3/uL — ABNORMAL HIGH (ref 3.8–10.8)

## 2019-11-18 LAB — COMPLETE METABOLIC PANEL WITH GFR
AG Ratio: 0.8 (calc) — ABNORMAL LOW (ref 1.0–2.5)
ALT: 14 U/L (ref 6–29)
AST: 16 U/L (ref 10–35)
Albumin: 3.5 g/dL — ABNORMAL LOW (ref 3.6–5.1)
Alkaline phosphatase (APISO): 87 U/L (ref 37–153)
BUN: 12 mg/dL (ref 7–25)
CO2: 25 mmol/L (ref 20–32)
Calcium: 9.9 mg/dL (ref 8.6–10.4)
Chloride: 105 mmol/L (ref 98–110)
Creat: 0.67 mg/dL (ref 0.50–0.99)
GFR, Est African American: 105 mL/min/{1.73_m2} (ref >=60)
GFR, Est Non African American: 90 mL/min/{1.73_m2} (ref >=60)
Globulin: 4.4 g/dL (calc) — ABNORMAL HIGH (ref 1.9–3.7)
Glucose, Bld: 98 mg/dL (ref 65–99)
Potassium: 4.4 mmol/L (ref 3.5–5.3)
Sodium: 141 mmol/L (ref 135–146)
Total Bilirubin: 0.5 mg/dL (ref 0.2–1.2)
Total Protein: 7.9 g/dL (ref 6.1–8.1)

## 2019-11-18 LAB — TSH: TSH: 3.16 mIU/L (ref 0.40–4.50)

## 2019-11-18 LAB — TEST AUTHORIZATION

## 2019-11-18 LAB — HEMOGLOBIN A1C
Hgb A1c MFr Bld: 5.5 % of total Hgb (ref <5.7)
Mean Plasma Glucose: 111 (calc)
eAG (mmol/L): 6.2 (calc)

## 2019-11-18 LAB — RETICULOCYTES
ABS Retic: 78600 {cells}/uL (ref 20000–8000)
Retic Ct Pct: 2 %

## 2019-11-18 LAB — TRANSFERRIN: Transferrin: 187 mg/dL — ABNORMAL LOW (ref 188–341)

## 2019-11-18 LAB — FOLATE: Folate: 13 ng/mL

## 2019-11-18 LAB — LIPID PANEL
Cholesterol: 204 mg/dL — ABNORMAL HIGH (ref <200)
HDL: 48 mg/dL — ABNORMAL LOW (ref >=50)
LDL Cholesterol (Calc): 139 mg/dL (calc) — ABNORMAL HIGH
Non-HDL Cholesterol (Calc): 156 mg/dL (calc) — ABNORMAL HIGH (ref <130)
Total CHOL/HDL Ratio: 4.3 (calc) (ref <5.0)
Triglycerides: 71 mg/dL (ref <150)

## 2019-11-18 LAB — VITAMIN B12: Vitamin B-12: 1155 pg/mL — ABNORMAL HIGH (ref 200–1100)

## 2019-11-18 LAB — FERRITIN: Ferritin: 210 ng/mL (ref 16–288)

## 2019-11-19 LAB — CK, TOTAL(REFL): Total CK: 29 U/L (ref 29–143)

## 2019-11-19 LAB — ANA: Anti Nuclear Antibody (ANA): POSITIVE — AB

## 2019-11-19 LAB — URIC ACID: Uric Acid, Serum: 6.3 mg/dL (ref 2.5–7.0)

## 2019-11-19 LAB — ANTI-NUCLEAR AB-TITER (ANA TITER)
ANA TITER: 1:80 {titer} — ABNORMAL HIGH
ANA Titer 1: 1:320 {titer} — ABNORMAL HIGH

## 2019-11-19 LAB — SEDIMENTATION RATE: Sed Rate: >130 mm/h — ABNORMAL HIGH (ref 0–30)

## 2019-11-19 LAB — RHEUMATOID FACTOR: Rheumatoid fact SerPl-aCnc: 964 IU/mL — ABNORMAL HIGH (ref <14)

## 2019-11-19 LAB — TSH: TSH: 2.18 mIU/L (ref 0.40–4.50)

## 2019-11-19 LAB — CYCLIC CITRUL PEPTIDE ANTIBODY, IGG: Cyclic Citrullin Peptide Ab: >250 UNITS — ABNORMAL HIGH

## 2019-11-21 ENCOUNTER — Other Ambulatory Visit: Payer: Self-pay

## 2019-11-21 ENCOUNTER — Encounter: Payer: Self-pay | Admitting: Internal Medicine

## 2019-11-21 ENCOUNTER — Ambulatory Visit (INDEPENDENT_AMBULATORY_CARE_PROVIDER_SITE_OTHER): Payer: Medicare Other | Admitting: Internal Medicine

## 2019-11-21 VITALS — BP 120/80 | HR 86 | Ht 62.0 in | Wt 171.0 lb

## 2019-11-21 DIAGNOSIS — R768 Other specified abnormal immunological findings in serum: Secondary | ICD-10-CM

## 2019-11-21 DIAGNOSIS — E039 Hypothyroidism, unspecified: Secondary | ICD-10-CM

## 2019-11-21 DIAGNOSIS — R011 Cardiac murmur, unspecified: Secondary | ICD-10-CM | POA: Diagnosis not present

## 2019-11-21 DIAGNOSIS — M25542 Pain in joints of left hand: Secondary | ICD-10-CM

## 2019-11-21 DIAGNOSIS — I10 Essential (primary) hypertension: Secondary | ICD-10-CM

## 2019-11-21 DIAGNOSIS — Z0000 Encounter for general adult medical examination without abnormal findings: Secondary | ICD-10-CM | POA: Diagnosis not present

## 2019-11-21 DIAGNOSIS — Z6831 Body mass index (BMI) 31.0-31.9, adult: Secondary | ICD-10-CM

## 2019-11-21 DIAGNOSIS — M25541 Pain in joints of right hand: Secondary | ICD-10-CM | POA: Diagnosis not present

## 2019-11-21 DIAGNOSIS — E7800 Pure hypercholesterolemia, unspecified: Secondary | ICD-10-CM

## 2019-11-21 DIAGNOSIS — R7989 Other specified abnormal findings of blood chemistry: Secondary | ICD-10-CM | POA: Diagnosis not present

## 2019-11-21 DIAGNOSIS — R700 Elevated erythrocyte sedimentation rate: Secondary | ICD-10-CM | POA: Diagnosis not present

## 2019-11-21 DIAGNOSIS — G4733 Obstructive sleep apnea (adult) (pediatric): Secondary | ICD-10-CM

## 2019-11-21 DIAGNOSIS — R7302 Impaired glucose tolerance (oral): Secondary | ICD-10-CM | POA: Diagnosis not present

## 2019-11-21 DIAGNOSIS — R413 Other amnesia: Secondary | ICD-10-CM | POA: Diagnosis not present

## 2019-11-21 LAB — POCT URINALYSIS DIPSTICK
Appearance: NEGATIVE
Bilirubin, UA: NEGATIVE
Blood, UA: NEGATIVE
Glucose, UA: NEGATIVE
Ketones, UA: NEGATIVE
Leukocytes, UA: NEGATIVE
Nitrite, UA: NEGATIVE
Odor: NEGATIVE
Protein, UA: NEGATIVE
Spec Grav, UA: 1.01 (ref 1.010–1.025)
Urobilinogen, UA: 0.2 E.U./dL
pH, UA: 6.5 (ref 5.0–8.0)

## 2019-11-21 NOTE — Progress Notes (Signed)
Subjective:    Patient ID: [name], female    DOB: 1951-03-05, 69 y.o.   MRN: [id number]  HPI 69 year old Female for Medicare Wellness visit, health maintenance exam and evaluation of medical issues.  She has moderate obstructive sleep apnea.  Was diagnosed initially with mild cognitive impairment by Dr. Leonides Cave, neuropsychologist in December 2016.  Husband relates there may be a family history of dementia in her mother.  She was started on Zoloft at that time for depression.  She has had a previous MRI of the brain with and without contrast showing no acute findings or abnormal lesions.  Mild periventricular and subcortical foci of nonspecific gliosis.  History of bilateral TKAs in February and December 2018 both done by Dr. Cleophas Dunker.  History of impaired glucose tolerance, hypertension and hyperlipidemia.  Last colonoscopy was Aug 17, 2009.  Dr. [place] Callas did allergy testing in 08-17-05 which was negative.  She saw ENT physician at that time for chronic sinusitis and was thought to have GE reflux.  CT of the sinuses was normal.  History in the remote past of iron deficiency anemia related to menorrhagia and that resolved with menopause.  Ganglion cyst removed from left hand 1999, bilateral tubal ligation 1993.  Thrombosed hemorrhoid excised by Dr. Derrell Lolling in 08/18/2003.  Family history: Father with history of hypertension died of renal failure and congestive heart failure.  He also had history of subdural hematoma and was 68 years old when he died.  Mother died at age 55 of diabetes and hypertension.  1 brother with history of hypertension on dialysis.  2 sisters, 1 of whom is living with history of colon cancer.  Younger sister died in 08-18-1994 of a cerebral hemorrhage.  Recently seen with joint pain/arthralgis and has positive ANA and CCP.  Her ANA titer is 1:320 and pattern is nuclear/speckled. CCP greater than 250. RF 964. Patient has elevated WBC 11,600. Hgb 10.1 grams. Fe/TIBC pending. Has appt with  Rheumatology August 16.  Colonoscopy is due and was  last done 08-17-09.No recent mammogram. This has been ordered once again.   Memory issues are getting in way of her health care and keeping appointments. It is best to communicate through her husband, [place].   She has appt to see Dr. Kieth Brightly later this week regarding Neuropsychological status and has appt with Neurology in November.   Hx hypothyroidism, hyperlipidemia and HTN. Is on Zoloft for depression that is longstanding.   Started on Meloxicam for joint pain late July before arthritis studies were back.   Is on Aricept per Neurology.  Social history: She is married.  Has twin daughters and 1 son.  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  Non-smoker.  Rarely drinks alcohol.  Son has had a kidney transplant.  Husband retired from [employer] where he worked as an Art gallery manager.  He is supportive of her needs.            Review of Systems  Constitutional: Negative.   Respiratory: Negative.        History of sleep apnea  Cardiovascular: Negative.   Gastrointestinal: Negative.   Genitourinary: Negative.   Musculoskeletal: Positive for arthralgias.  Neurological:       Memory loss       Objective:   Physical Exam Vitals reviewed.  Constitutional:      Appearance: Normal appearance.  HENT:     Head: Atraumatic.     Right Ear: Tympanic membrane normal.  Left Ear: Tympanic membrane normal.     Nose: Nose normal.  Eyes:     General: No scleral icterus.    Extraocular Movements: Extraocular movements intact.     Conjunctiva/sclera: Conjunctivae normal.     Pupils: Pupils are equal, round, and reactive to light.  Cardiovascular:     Rate and Rhythm: Normal rate and regular rhythm.     Heart sounds: Normal heart sounds. No murmur heard.   Pulmonary:     Effort: Pulmonary effort is normal. No respiratory distress.     Breath sounds: Normal breath sounds. No wheezing or  rales.  Abdominal:     General: There is no distension.     Palpations: There is no mass.     Tenderness: There is no abdominal tenderness. There is no guarding or rebound.  Genitourinary:    Comments: Bimanual was normal Pap deferred due to age Musculoskeletal:     Cervical back: Neck supple. No rigidity.     Right lower leg: No edema.     Left lower leg: No edema.  Lymphadenopathy:     Cervical: No cervical adenopathy.  Skin:    General: Skin is warm and dry.     Findings: No rash.  Neurological:     General: No focal deficit present.     Mental Status: She is alert and oriented to person, place, and time.     Cranial Nerves: No cranial nerve deficit.     Sensory: No sensory deficit.     Motor: No weakness.     Coordination: Coordination normal.     Gait: Gait normal.  Psychiatric:        Mood and Affect: Mood normal.        Behavior: Behavior normal.        Thought Content: Thought content normal.        Judgment: Judgment normal.     Comments: Cannot remember 3 items after 5 minutes           Assessment & Plan:  Memory loss-followed by Neurology and Dr. Kieth Brightly.  She is on Aricept and Zoloft.  Take Xanax if needed for anxiety.  Positive CCP-likely has rheumatoid arthritis and has been referred to [facility].  Was having joint pain and that is why the studies were done.  Initially treated with meloxicam before results were back.  History of bilateral TKAs  Impaired glucose tolerance treated with diet  Essential hypertension-stable on lisinopril and Maxide 25  History of hyperlipidemia-total cholesterol 204, HDL 48, LDL 139.  She is supposed to be on rosuvastatin 5 mg daily  Hypothyroidism treated with levothyroxine 50 mcg daily  BMI 31.28.  Has lost about 24 pounds since March.  Says she does not get is hungry.  TSH is within normal limits.  Health maintenance colonoscopy due but if she does not want to do colonoscopy we can certainly do  Cologuard.  She has had 2 COVID-19 immunizations and to pneumococcal immunizations.  Tetanus immunization is up-to-date.  Mammogram ordered but has not been done since 2017 by her records.  Plan: Await rheumatology evaluation.  Will be due for neurology and neuropsychological evaluations soon.  Follow-up here in 6 months.  Needs mammogram.  Order Cologuard for colon cancer screening.  Subjective:   Patient presents for Medicare Annual/Subsequent preventive examination.  Review Past Medical/Family/Social: See above   Risk Factors  Current exercise habits: Walks some Dietary issues discussed: Low-fat low carbohydrate  Cardiac risk factors: Hyperlipidemia, family history,  father died of congestive heart failure  Depression Screen  (Note: if answer to either of the following is "Yes", a more complete depression screening is indicated)   Over the past two weeks, have you felt down, depressed or hopeless? No  Over the past two weeks, have you felt little interest or pleasure in doing things? No Have you lost interest or pleasure in daily life? No Do you often feel hopeless? No Do you cry easily over simple problems? No   Activities of Daily Living  In your present state of health, do you have any difficulty performing the following activities?:   Driving?  Husband drives for her now Managing money?  Husband handles Feeding yourself? No  Getting from bed to chair? No  Climbing a flight of stairs? No  Preparing food and eating?: No  Bathing or showering? No  Getting dressed: No  Getting to the toilet? No  Using the toilet:No  Moving around from place to place: No  In the past year have you fallen or had a near fall?:No  Are you sexually active?  Yes Do you have more than one partner? No   Hearing Difficulties: No  Do you often ask people to speak up or repeat themselves?  Sometimes Do you experience ringing or noises in your ears? No  Do you have difficulty understanding soft or  whispered voices?  Do you feel that you have a problem with memory?  Sometimes Do you often misplace items?    Home Safety:  Do you have a smoke alarm at your residence? Yes Do you have grab bars in the bathroom?  No Do you have throw rugs in your house?  Is   Cognitive Testing  Alert? Yes Normal Appearance?Yes  Oriented to person? Yes Place? Yes  Time?  No Recall of three objects?  No Can perform simple calculations?  A PE Displays appropriate judgment?Yes  Can read the correct time from a watch face?Yes   List the Names of Other Physician/Practitioners you currently use:  See referral list for the physicians patient is currently seeing.  Neurologist and neuropsychologist  Appointment made with rheumatologist   Review of Systems: See above   Objective:     General appearance: Appears stated age and thinner than last visit Head: Normocephalic, without obvious abnormality, atraumatic  Eyes: conj clear, EOMi PEERLA  Ears: normal TM's and external ear canals both ears  Nose: Nares normal. Septum midline. Mucosa normal. No drainage or sinus tenderness.  Throat: lips, mucosa, and tongue normal; teeth and gums normal  Neck: no adenopathy, no carotid bruit, no JVD, supple, symmetrical, trachea midline and thyroid not enlarged, symmetric, no tenderness/mass/nodules  No CVA tenderness.  Lungs: clear to auscultation bilaterally  Breasts: normal appearance, no masses or tenderness, top of the pacemaker on left upper chest. Incision well-healed. It is tender.  Heart: regular rate and rhythm, S1, S2 normal, no murmur, click, rub or gallop  Abdomen: soft, non-tender; bowel sounds normal; no masses, no organomegaly  Musculoskeletal: ROM normal in all joints, no crepitus, no deformity, Normal muscle strengthen. Back  is symmetric, no curvature. Skin: Skin color, texture, turgor normal. No rashes or lesions  Lymph nodes: Cervical, supraclavicular, and axillary nodes normal.    Neurologic: CN 2 -12 Normal, Normal symmetric reflexes. Normal coordination and gait  Psych: Alert & Oriented x 3, Mood appear stable.    Assessment:    Annual wellness medicare exam   Plan:    During the course of  the visit the patient was educated and counseled about appropriate screening and preventive services including:   See above     Patient Instructions (the written plan) was given to the patient.  Medicare Attestation  I have personally reviewed:  The patient's medical and social history  Their use of alcohol, tobacco or illicit drugs  Their current medications and supplements  The patient's functional ability including ADLs,fall risks, home safety risks, cognitive, and hearing and visual impairment  Diet and physical activities  Evidence for depression or mood disorders  The patient's weight, height, BMI, and visual acuity have been recorded in the chart. I have made referrals, counseling, and provided education to the patient based on review of the above and I have provided the patient with a written personalized care plan for preventive services.

## 2019-11-21 NOTE — Patient Instructions (Addendum)
Please have Fe TIBC drawn at [facility] as there is a reagent shortage at [facility]. RTC in 6 months. Keep appointment with Rheumatologist on August 16. Have mammogram. Colonoscopy is due but we can order Cologuard in place implant.  She has lost about 24 pounds since March.

## 2019-11-23 ENCOUNTER — Encounter: Payer: Self-pay | Admitting: Internal Medicine

## 2019-11-23 ENCOUNTER — Encounter: Payer: Medicare Other | Attending: Psychology | Admitting: Psychology

## 2019-11-23 ENCOUNTER — Encounter: Payer: Self-pay | Admitting: Psychology

## 2019-11-23 ENCOUNTER — Other Ambulatory Visit: Payer: Self-pay

## 2019-11-23 DIAGNOSIS — R413 Other amnesia: Secondary | ICD-10-CM | POA: Diagnosis not present

## 2019-11-23 DIAGNOSIS — F0280 Dementia in other diseases classified elsewhere without behavioral disturbance: Secondary | ICD-10-CM | POA: Insufficient documentation

## 2019-11-23 DIAGNOSIS — D619 Aplastic anemia, unspecified: Secondary | ICD-10-CM | POA: Diagnosis not present

## 2019-11-23 DIAGNOSIS — F418 Other specified anxiety disorders: Secondary | ICD-10-CM

## 2019-11-23 DIAGNOSIS — G4733 Obstructive sleep apnea (adult) (pediatric): Secondary | ICD-10-CM | POA: Diagnosis not present

## 2019-11-23 DIAGNOSIS — G300 Alzheimer's disease with early onset: Secondary | ICD-10-CM | POA: Insufficient documentation

## 2019-11-23 DIAGNOSIS — D649 Anemia, unspecified: Secondary | ICD-10-CM | POA: Diagnosis not present

## 2019-11-23 NOTE — Progress Notes (Addendum)
Neuropsychological Consultation   Patient:   [name]   DOB:   [birth year]  MR Number:  [id number]  Location:  [facility] FOR PAIN AND [facility] MEDICINE [facility] PHYSICAL MEDICINE AND REHABILITATION [address] [place], [address] [id number] [facility] [place] [address] Dept: [phone]           Date of Service:   11/23/2019  Start Time:   10 AM End Time:   12 PM  Today's visit was a in person visit that was conducted with the patient and her husband present.  It was conducted in my outpatient clinic office.  1 hour 15 minutes were spent in the clinical interview and 45 minutes was spent with report writing.  Provider/Observer:  Arley Phenix, Psy.D.       Clinical Neuropsychologist       Billing Code/Service: 96116/96121  Chief Complaint:    [name]. [name] is a 69 year old female referred by Dr. Frances Furbish for neuropsychological evaluation in 2020.  She is also being followed by Luanna Cole. Lenord Fellers, MD who is her PCP.  I initially saw the patient for neuropsychological evaluation on 01/13/2019 and this is a follow-up evaluation for repeat testing.  Reason for Service:  [name] is a 69 year old female referred by Dr. Frances Furbish, who is her neurologist with [facility], for neuropsychological evaluation due to the patient reporting memory difficulties that seem to be progressing to some degree.  The patient is also followed by Luanna Cole. Lenord Fellers, MD, who is her PCP.    During the initial clinical interview, the patient reported that she was diagnosed with mild cognitive impairment approximately 6 years ago and has consistently reported difficulties with forgetfulness and memory issues as well as anxiety, stress and depressive symptoms.  The patient reports that her memory difficulties have slowly progressed over the past 6 years or so but does not describe a stepwise progression.  The patient denies any other types of particular cognitive difficulties.  The patient has had  times where she did rather poorly on mental status exams at various assessments.  The patient is also been diagnosed with obstructive sleep apnea and at times have had significantly low compliance but other times where she is improving compliance.  She still has a way to go as far as improving her compliance with CPAP machine.  While the patient reported some progression with her memory difficulties she describes it is generally stable over the past several years.  The patient describes good days and bad days and describes correlation between her memory difficulties and her level of anxiety.  The patient does take Xanax (0.5 mg up to twice per day.  The patient is also taking sertraline for her depression.  The patient takes lisinopril at bedtime to facilitate with sleep.  The patient denies any other cognitive changes including expressive or receptive language changes, executive functioning/reasoning and problem changes or visual spatial/geographic disorientation type symptoms.  She relates to the only cognitive change that she identifies has to do with her memory.  The patient did have some questions about whether relationship issues, anxiety or depression could play a role in her memory difficulties and is concerned that they may be playing a significant role as they are correlated.  During today's clinical interview, the patient reported that since our last visit things have been pretty stable.  The patient is described as having good days and bad days.  The patient's husband reports that on good days the patient is quite stable  and not as repetitive or frustrated/anxious.  The patient's husband reports that on bad days the patient tends to be very unhappy and anxious and will ask questions 2 or 3 times within a 15-minute span of time.  He reports that there has been some mild worsening over the past 9 months.  He gave an example of an issue where the patient lost her pocketbook in the house somewhere and  they were never able to find it as the patient gets anxious and someone will take her purse and she will hide it and have difficulty finding and again.  They were never able to find this pocketbook and purchased a new one and got all new personal information.  The patient then hit that new when a drawer and it was lost for some time but was later found.  The patient has now lost it again and they have not found it.  The patient is stopped wearing her CPAP device every night and is not doing as well.  The patient's husband reports that she is getting ready to be treated for RA and has significant pain and difficulties from RA.  The patient has lost weight and has not been taking her iron supplements and has become more anemic.  The patient's husband reports that he has to constantly watch her medications and make sure she is keeping up and taking her medications.  She will get confused about her medications at times.  During today's clinical interview the patient reported that she is not really avoiding being around others as has been suggested and that she does enjoy being around others but her husband reports that she is avoiding spending time outside of the house and the patient will get more uptight and worried when she is away.  The patient reports that she is happy in her kids homes but becomes fairly defensive about talking about various issues she is having.  She insists that she is comfortable just being at home and she wants to be there.  The patient reports that she does not feel like she needs her CPAP all the time and acknowledges she is not always using it.  The patient reported last year that she has had issues with anxiety for some time.  She reports that years ago her son was diagnosed with kidney disease and as her father had also been on dialysis this really concerned and worried her.  Her adult son has had a kidney transplant and at this point is doing quite well with no problems as far as  his kidneys.  The patient reports that more recently that she feels more alone at times and does not feel like she gets a lot of support from her husband who spends a lot of time away from the home playing golf or with other activities during the day.  The patient has a medical history including hypertension, hyperlipidemia, sleep apnea, history of iron deficiency anemia, osteoarthritis, benign or innocent cardiac murmurs, anxiety, impaired glucose intolerance, metabolic syndrome and depression.  The patient had an MRI of her brain with and without contrast conducted on 06/30/2018.  The impressions of this MRI were of mild periventricular and subcortical foci of nonspecific gliosis.  There were no abnormal lesions or findings of acute changes or indication suggestive of stroke or other cerebrovascular issues.  The patient has now had 2 previous neuropsychological assessments.  She was initially referred for neuropsychological testing and seen by Dr. Leonides Cave and was diagnosed with mild  cognitive impairments.  This evaluation was conducted on 03/30/2015.  There were significant auditory verbal memory deficiencies as well as issues related to depression identified.  The patient was diagnosed with mild cognitive impairment amnestic type in the setting of unspecified depressive disorder.  On 01/28/2019 a formal neuropsychological evaluation was completed again with the entire report available in her EMR.  There had been some progressive worsening in her overall status from previous assessment and her diagnostic considerations were changed to early onset dementia of the Alzheimer's type with depression.  While the patient is over 26 years old the symptoms can now be traced back to dating to prior to 60.  The patient continued to show significant loss in function regarding memory and learning and overall information processing speed and continued to have a very focal pattern consistent with temporal lobe and  hippocampal functioning.  Behavioral Observation: NEALY HICKMON  presents as a 69 y.o.-year-old Right African American Female who appeared her stated age. her dress was Appropriate and she was Well Groomed and her manners were Appropriate to the situation.  her participation was indicative of Resistant behaviors.  There were not any physical disabilities noted.  she displayed an appropriate level of cooperation and motivation.     Interactions:    Active Resistant  Attention:   abnormal and attention span appeared shorter than expected for age  Memory:   abnormal; remote memory intact, recent memory impaired  Visuo-spatial:  not examined  Speech (Volume):  normal  Speech:   normal; normal  Thought Process:  Coherent and Relevant  Though Content:  WNL; not suicidal and not homicidal  Orientation:   person, place and time/date  Judgment:   Fair  Planning:   Fair  Affect:    Anxious and Defensive  Mood:    Dysphoric  Insight:   Fair  Intelligence:   normal  Medical History:   Past Medical History:  Diagnosis Date  . Anemia   . Hyperlipidemia   . Hypertension   . Pre-diabetes   . Sleep apnea    Family Med/Psych History:  Family History  Problem Relation Age of Onset  . Diabetes Mother   . Hypertension Mother   . Kidney disease Father   . Heart disease Father   . Stroke Sister     Impression/DX:  Sheletha T. Garlitz is a 69 year old female referred by Dr. Frances Furbish for neuropsychological evaluation in 2020.  She is also being followed by Luanna Cole. Lenord Fellers, MD who is her PCP.  I initially saw the patient for neuropsychological evaluation on 01/13/2019 and this is a follow-up evaluation for repeat testing.   Disposition/Plan:  The patient has been scheduled for follow-up repeat testing.  Once this testing is completed formal report will be provided to her referring physicians.  During the clinical interview it was noted that the patient has not been using her CPAP device  regularly in the very clear recommendation that she begin using her CPAP device not only every night when she is sleeping but also during the day when she is taking naps.  Diagnosis:    Early onset Alzheimer's dementia without behavioral disturbance (HCC)  Memory loss  Depression with anxiety  Obstructive sleep apnea syndrome         Electronically Signed   _______________________ Arley Phenix, Psy.D.

## 2019-11-24 ENCOUNTER — Telehealth: Payer: Self-pay

## 2019-11-24 NOTE — Telephone Encounter (Signed)
Patient is iron deficiency Dr. Lenord Fellers recommends a cologuard.

## 2019-11-25 ENCOUNTER — Other Ambulatory Visit: Payer: Self-pay

## 2019-11-25 DIAGNOSIS — Z1211 Encounter for screening for malignant neoplasm of colon: Secondary | ICD-10-CM

## 2019-11-25 NOTE — Telephone Encounter (Signed)
Left voicemail on patient's husband to have patient take iron OTC once a day for iron deficiency and to let him know that Dr. Lenord Fellers has ordered a cologuard.

## 2019-12-05 DIAGNOSIS — Z6830 Body mass index (BMI) 30.0-30.9, adult: Secondary | ICD-10-CM | POA: Diagnosis not present

## 2019-12-05 DIAGNOSIS — M150 Primary generalized (osteo)arthritis: Secondary | ICD-10-CM | POA: Diagnosis not present

## 2019-12-05 DIAGNOSIS — E669 Obesity, unspecified: Secondary | ICD-10-CM | POA: Diagnosis not present

## 2019-12-05 DIAGNOSIS — M2550 Pain in unspecified joint: Secondary | ICD-10-CM | POA: Diagnosis not present

## 2019-12-05 DIAGNOSIS — M0579 Rheumatoid arthritis with rheumatoid factor of multiple sites without organ or systems involvement: Secondary | ICD-10-CM | POA: Diagnosis not present

## 2019-12-05 DIAGNOSIS — R5382 Chronic fatigue, unspecified: Secondary | ICD-10-CM | POA: Diagnosis not present

## 2019-12-12 ENCOUNTER — Encounter (HOSPITAL_BASED_OUTPATIENT_CLINIC_OR_DEPARTMENT_OTHER): Payer: Medicare Other | Admitting: Psychology

## 2019-12-12 ENCOUNTER — Other Ambulatory Visit: Payer: Self-pay | Admitting: Internal Medicine

## 2019-12-12 ENCOUNTER — Other Ambulatory Visit: Payer: Self-pay

## 2019-12-12 DIAGNOSIS — F0280 Dementia in other diseases classified elsewhere without behavioral disturbance: Secondary | ICD-10-CM

## 2019-12-12 DIAGNOSIS — F418 Other specified anxiety disorders: Secondary | ICD-10-CM | POA: Diagnosis not present

## 2019-12-12 DIAGNOSIS — G300 Alzheimer's disease with early onset: Secondary | ICD-10-CM | POA: Diagnosis not present

## 2019-12-12 DIAGNOSIS — G4733 Obstructive sleep apnea (adult) (pediatric): Secondary | ICD-10-CM | POA: Diagnosis not present

## 2019-12-12 DIAGNOSIS — R413 Other amnesia: Secondary | ICD-10-CM | POA: Diagnosis not present

## 2019-12-12 DIAGNOSIS — Z1231 Encounter for screening mammogram for malignant neoplasm of breast: Secondary | ICD-10-CM

## 2019-12-15 ENCOUNTER — Encounter: Payer: Self-pay | Admitting: Psychology

## 2019-12-15 NOTE — Progress Notes (Addendum)
Neuropsychology Note  [name] completed 240 minutes of repeat neuropsychological testing with this provider. She arrived on time to her testing appointment and was accompanied by her husband. She appeared casually dressed and with adequate hygiene (e.g., long but clean finger nails ). She wore glasses. She ambulated without assistance. She was alert and oriented to person and place, but not time/date or situation. Her mood was anxious and depressed. She displayed slightly inappropriate eye contact and was observed staring at times. Psychomotor functioning was somewhat reduced (e.g., slow and methodical). Rapport was somewhat difficult to establish and maintain. Attention was variable. Speech was mostly clear, goal oriented, and prosodic with normal volume. Language was fluent with no evidence of poor articulation. Mild-moderate word finding difficulty was noted. Thought processes appeared somewhat disorganized but rigid and concrete.  Memory appeared significantly impaired and amnestic. Insight and judgement seemed poor and limited, respectively. She was cooperative with the evaluation despite n  She mentioned being compliant with C-PAP for past several weeks. She denied problems with driving but stated that she has not performed this activity in "a couple weeks" due to staying home most days. She also denied problems performing household tasks and chores (e.g., laundry, cleaning, etc.); her husband purportedly cooks most meals.   Of note, she denied recall of previous evaluation including meeting this provider in the past.   Tests Administered:  Animal Naming   [employer], 2nd edition, Short Form  Clock Drawing Test  Controlled Oral Word Association Test (COWAT)  Repeatable Battery for the Assessment of Neuropsychological Status, Update, Form A (RBANS-A), Select subtests   Trail Making Test (Part A& B)  Wechsler Adult Intelligence Scale, 4th edition  (WAIS-IV)  Wechsler Memory Scale, 4th edition, (Older Adult Battery)  Results:  Animal Naming   Raw Score T-Score Percentile Total:    6,   29  2  COWAT FAS:    27,   47  38  Clock Drawing Test - WNL   RBANS-Form A   Raw Score Percentile  Semantic Fluency:   6,   <1  Line Orientation:   14/20   17-25  Picture Naming:   9/10  17-25  Trail Making Test   Raw Score T-Score Percentile Part A:    71"   33   5  Errors:0   Part B:    340"   26  1  Errors=3      WAIS-IV   Composite Score Summary  Scale Sum of Scaled Scores Composite Score Percentile Rank 95% Conf. Interval Qualitative Description  Verbal Comprehension 16 VCI 74 4 69-81 Borderline  Perceptual Reasoning 21 PRI 82 12 77-89 Low Average  Working Memory 13 WMI 80 9 74-88 Low Average  Processing Speed 9 PSI 71 3 66-82 Borderline  Full Scale 59 FSIQ 72 3 68-77 Borderline  General Ability 37 GAI 76 5 72-82 Borderline   Differences Between Subtest and Overall Mean of Subtest Scores  Subtest Subtest Scaled Score Mean Scaled Score Difference Critical Value .05 Strength or Weakness Base Rate  Block Design 8 5.90 2.10 2.85  >25%  Similarities 5 5.90 -0.90 2.82  >25%  Digit Span 7 5.90 1.10 2.22  >25%  Matrix Reasoning 6 5.90 0.10 2.54  >25%  Vocabulary 7 5.90 1.10 2.03  >25%  Arithmetic 6 5.90 0.10 2.73  >25%  Symbol Search 3 5.90 -2.90 3.42  15-25%  Visual Puzzles 7 5.90 1.10 2.71  >25%  Information 4 5.90 -1.90  2.19  >25%  Coding 6 5.90 0.10 2.97  >25%   Working Memory Process Score Summary  Process Score Raw Score Scaled Score Percentile Rank Base Rate SEM  Digit Span Forward 8 8 25  -- 1.37  Digit Span Backward 5 6 9  -- 1.41  Digit Span Sequencing 6 8 25  -- 1.31  Longest Digit Span Forward 5 -- -- 95.5 --  Longest Digit Span Backward 3 -- -- 96.0 --  Longest Digit Span Sequence 4 -- -- 92.5 --   WMS-IV (Older Adult Battery)     Scaled Score  Percentile Visual Reproduction I   4   2  Visual  Reproduction II  1   <1  Logical Memory I   5   5 Logical Memory-II  1   <1  Symbol Span   4   2      Raw score  Percentile LM-II Recognition   15/39   3-9 VR-II Recognition  4/7   26-50  CVLT-3, Brief Form  Index Sum of scaled scores Index score Percentile rank    Trials 1-4 Correct 25 75 5    Delayed Recall Correct 3 45 <0.1    Total Recall Correct 28 58 0.3      Immediate Recall Score Raw score Scaled score Percentile rank    Trial 1 Correct 4 8 25     Trial 2 Correct 4 5 5     Trial 3 Correct 6 7 16     Trial 4 Correct 6 5 5       Delayed Recall Score Raw score Scaled score Percentile rank    Short Delay Free Recall Correct 0 1 0.1    Long Delay Free Recall Correct 0 1 0.1    Long Delay Cued Recall Correct 0 1 0.1      Yes/No Recognition Score Raw score Scaled score Percentile rank    Total Hits 3 2 0.4    Total False Positives 6 1 0.1    Recognition Discriminability (d') 0 1 0.1    Recognition Discriminability Nonparametric 56 1 0.1     Forced Choice Recognition Score Raw score Base rate    Total Hits 4.0 <=2.0      [name] will return for an interactive feedback session with Dr. 01-18-1993 on 01/25/20, at which time her test performances, clinical impressions and treatment recommendations will be reviewed in detail. The patient understands she can contact our office should she require our assistance before this time.  Full report to follow.

## 2020-01-12 ENCOUNTER — Encounter: Payer: Medicare Other | Attending: Psychology | Admitting: Psychology

## 2020-01-12 ENCOUNTER — Other Ambulatory Visit: Payer: Self-pay

## 2020-01-12 DIAGNOSIS — F418 Other specified anxiety disorders: Secondary | ICD-10-CM | POA: Insufficient documentation

## 2020-01-12 DIAGNOSIS — G4733 Obstructive sleep apnea (adult) (pediatric): Secondary | ICD-10-CM | POA: Insufficient documentation

## 2020-01-12 DIAGNOSIS — R413 Other amnesia: Secondary | ICD-10-CM | POA: Insufficient documentation

## 2020-01-12 DIAGNOSIS — G300 Alzheimer's disease with early onset: Secondary | ICD-10-CM | POA: Insufficient documentation

## 2020-01-12 DIAGNOSIS — F0280 Dementia in other diseases classified elsewhere without behavioral disturbance: Secondary | ICD-10-CM | POA: Insufficient documentation

## 2020-01-16 DIAGNOSIS — M2550 Pain in unspecified joint: Secondary | ICD-10-CM | POA: Diagnosis not present

## 2020-01-16 DIAGNOSIS — Z6831 Body mass index (BMI) 31.0-31.9, adult: Secondary | ICD-10-CM | POA: Diagnosis not present

## 2020-01-16 DIAGNOSIS — E669 Obesity, unspecified: Secondary | ICD-10-CM | POA: Diagnosis not present

## 2020-01-16 DIAGNOSIS — R5382 Chronic fatigue, unspecified: Secondary | ICD-10-CM | POA: Diagnosis not present

## 2020-01-16 DIAGNOSIS — M150 Primary generalized (osteo)arthritis: Secondary | ICD-10-CM | POA: Diagnosis not present

## 2020-01-16 DIAGNOSIS — M0579 Rheumatoid arthritis with rheumatoid factor of multiple sites without organ or systems involvement: Secondary | ICD-10-CM | POA: Diagnosis not present

## 2020-01-18 ENCOUNTER — Other Ambulatory Visit: Payer: Self-pay

## 2020-01-18 ENCOUNTER — Ambulatory Visit
Admission: RE | Admit: 2020-01-18 | Discharge: 2020-01-18 | Disposition: A | Discharge status: Home / Self Care | Payer: Medicare Other | Source: Ambulatory Visit | Attending: Internal Medicine | Admitting: Internal Medicine

## 2020-01-18 DIAGNOSIS — Z1231 Encounter for screening mammogram for malignant neoplasm of breast: Secondary | ICD-10-CM | POA: Diagnosis not present

## 2020-01-24 ENCOUNTER — Encounter: Payer: Self-pay | Admitting: Psychology

## 2020-01-24 NOTE — Progress Notes (Signed)
Patient:  [name]   DOB: [birth year]  MR Number: [id number]  Location: [facility] PH[facility]ICAL MEDICINE AND REHABILITATION [address] [place], [address] [id number] [facility] [place] [address] Dept: [phone]  Start: 3 PM End: 4 PM  Provider/Observer:     Hershal Coria PsyD  Chief Complaint:      Chief Complaint  Patient presents with  . Memory Loss  . Anxiety  . Sleeping Problem  . Depression    Reason For Service:     BrendaSimmsis a 69 year old female referred by Dr.Athar, who is her neurologist with [facility], for neuropsychological evaluation due to the patient reporting memory difficulties that seem to be progressing to some degree. The patient is also followed by Luanna Cole. Lenord Fellers, MD, who is her PCP.   During the initial clinical interview, the patient reported that she was diagnosed with mild cognitive impairment approximately 6 years ago and has consistently reported difficulties with forgetfulness and memory issues as well as anxiety, stress and depressive symptoms. The patient reports that her memory difficulties have slowly progressed over the past 6 years or so but does not describe a stepwise progression. The patient denies any other types of particular cognitive difficulties. The patient has had times where she did rather poorly on mental status exams at various assessments. The patient is also been diagnosed with obstructive sleep apnea and at times have had significantly low compliance but other times where she is improving compliance. She still has a way to go as far as improving her compliance with CPAP machine.  While the patient reported some progression with her memory difficulties she describes it is generally stable over the past several years. The patient describes good days and bad days and describes correlation between her memory difficulties and her level of anxiety. The patient  does take Xanax (0.5 mg up to twice per day. The patient is also taking sertraline for her depression. The patient takes lisinopril at bedtime to facilitate with sleep. The patient denies any other cognitive changes including expressive or receptive language changes, executive functioning/reasoning and problem changes or visual spatial/geographic disorientation type symptoms. She relates to the only cognitive change that she identifies has to do with her memory. The patient did have some questions about whether relationship issues, anxiety or depression could play a role in her memory difficulties and is concerned that they may be playing a significant role as they are correlated.  During today's clinical interview, the patient reported that since our last visit things have been pretty stable.  The patient is described as having good days and bad days.  The patient's husband reports that on good days the patient is quite stable and not as repetitive or frustrated/anxious.  The patient's husband reports that on bad days the patient tends to be very unhappy and anxious and will ask questions 2 or 3 times within a 15-minute span of time.  He reports that there has been some mild worsening over the past 9 months.  He gave an example of an issue where the patient lost her pocketbook in the house somewhere and they were never able to find it as the patient gets anxious and someone will take her purse and she will hide it and have difficulty finding and again.  They were never able to find this pocketbook and purchased a new one and got all new personal information.  The patient then hit that new when a drawer  and it was lost for some time but was later found.  The patient has now lost it again and they have not found it.  The patient is stopped wearing her CPAP device every night and is not doing as well.  The patient's husband reports that she is getting ready to be treated for RA and has significant pain and  difficulties from RA.  The patient has lost weight and has not been taking her iron supplements and has become more anemic.  The patient's husband reports that he has to constantly watch her medications and make sure she is keeping up and taking her medications.  She will get confused about her medications at times.  During today's clinical interview the patient reported that she is not really avoiding being around others as has been suggested and that she does enjoy being around others but her husband reports that she is avoiding spending time outside of the house and the patient will get more uptight and worried when she is away.  The patient reports that she is happy in her kids homes but becomes fairly defensive about talking about various issues she is having.  She insists that she is comfortable just being at home and she wants to be there.  The patient reports that she does not feel like she needs her CPAP all the time and acknowledges she is not always using it.  The patient reported last year that she has had issues with anxiety for some time. She reports that years ago her son was diagnosed with kidney disease and as her father had also been on dialysis this really concerned and worried her. Her adult son has had a kidney transplant and at this point is doing quite well with no problems as far as his kidneys. The patient reports that more recently that she feels more alone at times and does not feel like she gets a lot of support from her husband who spends a lot of time away from the home playing golf or with other activities during the day.  The patient has a medical history including hypertension, hyperlipidemia, sleep apnea, history of iron deficiency anemia, osteoarthritis, benign or innocent cardiac murmurs, anxiety, impaired glucose intolerance, metabolic syndrome and depression.  The patient had an MRI of her brain with and without contrast conducted on 06/30/2018. The impressions of  this MRI were of mild periventricular and subcortical foci of nonspecific gliosis. There were no abnormal lesions or findings of acute changes or indication suggestive of stroke or other cerebrovascular issues.  The patient has now had 2 previous neuropsychological assessments.  She was initially referred for neuropsychological testing and seen by Dr. Leonides Cave and was diagnosed with mild cognitive impairments.  This evaluation was conducted on 03/30/2015.  There were significant auditory verbal memory deficiencies as well as issues related to depression identified.  The patient was diagnosed with mild cognitive impairment amnestic type in the setting of unspecified depressive disorder.  On 01/28/2019 a formal neuropsychological evaluation was completed again with the entire report available in her EMR.  There had been some progressive worsening in her overall status from previous assessment and her diagnostic considerations were changed to early onset dementia of the Alzheimer's type with depression.  While the patient is over 38 years old the symptoms can now be traced back to dating to prior to 77.  The patient continued to show significant loss in function regarding memory and learning and overall information processing speed and continued to have  a very focal pattern consistent with temporal lobe and hippocampal functioning.   Testing Administered:  Animal Naming   [employer] Verbal Learning Test, 2nd edition, Short Form  Clock Drawing Test  Controlled Oral Word Association Test (COWAT)  Repeatable Battery for the Assessment of Neuropsychological Status, Update, Form A (RBANS-A), Select subtests   Trail Making Test (Part A& B)  Wechsler Adult Intelligence Scale, 4th edition (WAIS-IV) Wechsler Memory Scale, 4th edition, (Older Adult Battery)  Behavioral Observation:  Laneta SimmersBrenda T Dahlem completed 240 minutes of repeat neuropsychological testing with this provider. She arrived on time to her testing  appointment and was accompanied by her husband. She appeared casually dressed and with adequate hygiene (e.g., long but clean finger nails ). She wore glasses. She ambulated without assistance. She was alert and oriented to person and place, but not time/date or situation. Her mood was anxious and depressed. She displayed slightly inappropriate eye contact and was observed staring at times. Psychomotor functioning was somewhat reduced (e.g., slow and methodical). Rapport was somewhat difficult to establish and maintain. Attention was variable. Speech was mostly clear, goal oriented, and prosodic with normal volume. Language was fluent with no evidence of poor articulation. Mild-moderate word finding difficulty was noted. Thought processes appeared somewhat disorganized but rigid and concrete.  Memory appeared significantly impaired and amnestic. Insight and judgement seemed poor and limited, respectively. She was cooperative with the evaluation.   Test Results:    Initially, an estimation was produced regarding the patient's historical/premorbid cognitive functioning based on her educational and occupational history.  This estimation was that the patient should be performing in the average range relative to a normative population historically with performances typically between composite scores of 95-105.  2020:  Animal Naming  Total=13, T=45, 31st% ? Repetitions=1 ? Intrusion(s)=1 COWAT   FAS Total=34, T=54, 46th% ? Repetitions=8 ? Intrusions=3  Trail Making Test  Part A  ? Time=34", T=54, 66th%   Part B ? Time=153", T=43, 25th%  2021:  Clock Drawing Test - WNL   RBANS-Form A                      Raw Score      Percentile  Semantic Fluency:      6,                     <1  Line Orientation:         14/20               17-25  Picture Naming:          9/10                 17-25  Trail Making Test                    Raw Score      T-Score           Percentile Part A:                                      71"                   33                    5             Errors:0  Part B:                                     340"                 26                    1             Errors=3             Initially, we looked at expressive language functioning and compared results from her 2020 evaluation to the current 2021 evaluation.  In 2020 the patient performed at the 31st percentile and 46 percentile respectively for verbal fluency measures and word finding abilities.  On measures of similar aspects of expressive language functioning the patient showed significant deficits with regard to somatic fluency and well below performances expected based on her 2020 evaluation.  The patient also was administered an individual measure (Trail Making Test part a and B) in both 2020 in 2021.  In 2020, the patient performed at the 66th percentile on Trail Making Test part A which measures visual scanning and speed of mental operations and performed in the 5th percentile relative to a normative population on the same measuring 2021.  On the Trail Making Test part B the patient performed at the 25th percentile relative to a normative population in 2020 and at the 1st percentile relative to a normative population on the current evaluation.  This is a significant decline in overall information processing speed, cognitive shifting and flexibility performances.  2020:           Composite Score Summary   Scale Sum of Scaled Scores Composite Score Percentile Rank 95% Conf. Interval Qualitative Description  Verbal Comprehension 24 VCI 89 23 84-95 Low Average  Perceptual Reasoning 27 PRI 94 34 88-101 Average  Working Memory 17 WMI 92 30 86-99 Average  Processing Speed 11 PSI 76 5 70-87 Borderline  Full Scale 79 FSIQ 85 16 81-89 Low Average  General Ability 51 GAI 91 27 86-96 Average   2021:  Composite Score Summary   Scale Sum of Scaled Scores Composite Score Percentile Rank  95% Conf. Interval Qualitative Description  Verbal Comprehension 16 VCI 74 4 69-81 Borderline  Perceptual Reasoning 21 PRI 82 12 77-89 Low Average  Working Memory 13 WMI 80 9 74-88 Low Average  Processing Speed 9 PSI 71 3 66-82 Borderline  Full Scale 59 FSIQ 72 3 68-77 Borderline  General Ability 37 GAI 76 5 72-82 Borderline    The patient was then administered the Wechsler Adult Intelligence Scale-IV again for repeat testing comparisons.  You will find the scores from 2020 and 2021 above.  The patient showed consistent patterns of relative strengths and weaknesses but overall a significant decline in global cognitive functioning.  In 2020 the patient produced general abilities index score of 91 which fell at the 27th percentile and was in the lower end of the average range.  On the current assessment she showed significant deterioration in performance and produced an general abilities index score of 76 which fell at the 5th percentile and in the borderline range.  This is a 15 point drop between these 2 assessments and is of statistical significance.  The patient showed significant changes in her overall verbal comprehension index scores, perceptual reasoning and visual-spatial performances, working memory performances but little  change in her overall information processing speed measures.   2020:  Verbal Comprehension Subtests Summary   Subtest Raw Score Scaled Score Percentile Rank Reference Group Scaled Score SEM  Similarities 24 10 50 10 1.04  Vocabulary 35 9 37 10 0.67  Information 6 5 5 6  0.73  (Comprehension) 17 7 16 7  1.08      2021:  Verbal Comprehension Subtests Summary  Subtest Raw Score Scaled Score Percentile Rank Reference Group Scaled Score SEM  Similarities 12 5 5 4  1.04  Vocabulary 26 7 16 8  0.67  Information 5 4 2 5  0.73    Assessing potential changes within the verbal comprehension index domain the patient scores from 2020 in 2021 are presented above.  The  patient showed significant deterioration overall in each domain noted.  The patient showed the most dramatic decrease in verbal reasoning and problem-solving abilities going from a scaled score of 10 which is in the average range and at the 50th percentile relative to a normative population in 2022 a scaled score of 5 which is at the fifth percent relative to a normative population this is a significant deterioration in verbal reasoning and problem-solving.  There was some mild decrease in vocabulary and word knowledge and little change in the patient's general fund of information which tends to be a hold test over time.  2020:  Perceptual Reasoning Subtests Summary   Subtest Raw Score Scaled Score Percentile Rank Reference Group Scaled Score SEM  Block Design 28 9 37 6 1.08  Matrix Reasoning 11 8 25 6  0.90  Visual Puzzles 11 10 50 7 0.85  (Picture Completion) 5 6 9 4  1.16     2021:  Perceptual Reasoning Subtests Summary  Subtest Raw Score Scaled Score Percentile Rank Reference Group Scaled Score  Block Design 24 8 25 6   Matrix Reasoning 7 6 9 3   Visual Puzzles 7 7 16 5     There is also some mild decrease in her visual spatial/perceptual reasoning over this timeframe with her most significant decrease on measures of visual reasoning and problem solving.   2020:  Working Raw Score Scaled Score Percentile Rank Reference Group Scaled Score SEM  Digit Span 21 8 25 6  0.79  Arithmetic 13 9 37 9 0.99    2021:  Working 2022 Raw Score Scaled Score Percentile Rank Reference Group Scaled Score SEM  Digit Span 19 7 16 5  0.79  Arithmetic 9 6 9 6  0.99   The patient did so significant decrease in measures of working memory particularly on items related to more complex multi processing and manipulating initially encoded information.  2020:  Processing Speed Subtests Summary   Subtest Raw Score Scaled Score Percentile Rank  Reference Group Scaled Score SEM  Symbol Search 11 4 2 2  1.31  Coding 40 7 16 5  0.99  (Cancellation) 24 6 9 5  1.34     2021:  Processing Speed Subtests Summary  Subtest Raw Score Scaled Score Percentile Rank Reference Group Scaled Score SEM  Symbol Search 10 3 1 2  1.31  Coding 35 6 9 4  0.99    Information processing speed stayed relatively stable and there was no significant difference in subtest performance although she continued to show significant issues with regard to overall information processing speed and she did perform in the impaired range on both assessments.  2020:  Primary Subtest Scaled Score Summary   Subtest Domain Raw Score Scaled Score  Percentile Rank  Logical Memory I AM 13 4 2   Logical Memory II AM 0 1 0.1  Visual Reproduction I VM 14 1 0.1  Visual Reproduction II VM 0 1 0.1  Spatial Addition VWM 3 4 2   Symbol Span VWM 7 5 5       2021:  Primary Subtest Scaled Score Summary  Subtest Domain Raw Score Scaled Score Percentile Rank  Logical Memory I AM 19 5 5   Logical Memory II AM 0 1 0.1  Visual Reproduction II VM 0 1 0.1  Symbol Span VWM 5 4 2    On both the objective assessment of memory utilizing the [place] memory scale status from numeral for performed in 2020 in 2021 the patient performed in the severely impaired range and had little room to show deterioration.  Her verbal memory as well as visual memory were both severely impaired.  The patient did not improve under recognition formats as her memory functions were so low to begin with.  2020:  CVLT-III, Brief  Demographically Adjusted Core Scores Summary    Immediate Recall Score Scaled score Demographically adjusted score    Trial 1 Correct 8 43    Trial 2 Correct 3 20    Trial 3 Correct 3 24    Trial 4 Correct 3 24      Delayed Recall Score Scaled score Demographically adjusted score    Short Delay Free Recall Correct 1 20    Long Delay Free Recall Correct 1 20    Long Delay  Cued Recall Correct 1 20      Yes/No Recognition Score Scaled score Demographically adjusted score    Total Hits 5 34    Total False Positives 1 23    Recognition Discriminability (d') 2 26      Recall Errors Score Scaled score Demographically adjusted score    Total Intrusions 6 36      Standard Score Score Standard score Demographically adjusted score    Total Recall Responses 60 20    Demographically Adjusted Standard Score Summary Index Index score Demographically adjusted score    Trials 1-4 Correct 62 20    Delayed Recall Correct 45 20    Total Recall Correct 50 20       2021:  CVLT-3, Brief Form  Index Sum of scaled scores Index score Percentile rank    Trials 1-4 Correct 25 75 5    Delayed Recall Correct 3 45 <0.1    Total Recall Correct 28 58 0.3      Immediate Recall Score Raw score Scaled score Percentile rank    Trial 1 Correct 4 8 25     Trial 2 Correct 4 5 5     Trial 3 Correct 6 7 16     Trial 4 Correct 6 5 5       Delayed Recall Score Raw score Scaled score Percentile rank    Short Delay Free Recall Correct 0 1 0.1    Long Delay Free Recall Correct 0 1 0.1    Long Delay Cued Recall Correct 0 1 0.1      Yes/No Recognition Score Raw score Scaled score Percentile rank    Total Hits 3 2 0.4    Total False Positives 6 1 0.1    Recognition Discriminability (d') 0 1 0.1    Recognition Discriminability Nonparametric 56 1 0.1     Forced Choice Recognition Score Raw score Base rate    Total Hits 4.0 <=2.0  Overall, the patient continued to show significant deficits with regard to serial learning after progressive presentations of information.  The patient showed continued severe deficits with regard to her ability to benefit from repeated exposure to information in particular deficits with regard to short delayed free recall and long delayed free and cued recall.  The pattern of strengths and weaknesses on the CVLT  were very consistent with one another although she did do somewhat better relative to her self on the most recent evaluation these are still severely impaired range beyond her immediate recall.  The patient has shown generally well-preserved initial encoding of information and immediate recall but her ability to store and organize and retrieve that information after period of delay is severely impaired.   Summary of Results:   The patient showed consistent patterns with previous testing but also displayed progressive and ongoing changes with most all neuropsychological domains assessed showing significant loss over the past year.  The patient showed significant further deterioration and her global cognitive functioning as well as further deterioration in her verbal fluency and word finding abilities.  The patient had severe deficits with regard to learning and memory on the initial assessment and the same patterns were still identified on the current assessment.  Impression/Diagnosis:   Overall, the results of the current neuropsychological evaluation in comparison to both the previous neuropsychological evaluation done several years ago by Dr. Leonides Cave and the evaluation done in 2020 by myself are consistent with the significant overall loss of functioning in the domains of memory and learning, information processing speed, visual perceptual and visual spatial abilities with preserved visual constructional abilities, and severe memory and learning deficits with relatively preserved auditory and visual encoding abilities.  The patient does have a past history of depression anxiety as well as chronic stress situations and has been diagnosed with obstructive sleep apnea without good compliance with her CPAP machine although it has improved.  As far as diagnostic consideration, the pattern of successive changes and progressive worsening in the pattern of strengths and weaknesses on objective cognitive assessments  would continue to be consistent with patterns of Alzheimer's dementia and given the initial presentation prior to age 8 a formal diagnosis of early onset Alzheimer's dementia without behavioral disturbance. The patient is continuing to show progressive deterioration in her overall functioning.  While earlier evaluations primarily showed initial deficits relative to temporal lobe and hippocampal functioning she is showing patterns consistent with further deterioration in frontal and parietal lobe regions as well.  The level of impairments could not be explained simply by depression anxiety or untreated sleep apnea and the patient does continue to do relatively well on encoding measures and she is not shown further deterioration in information processing speed that would be consistent with primarily small vessel disease types of conditions.  There are no symptoms related to features that differentiate Alzheimer's from Lewy body dementia primarily and therefore it does appear to be that this is a very consistent and distinctive pattern of early onset Alzheimer's dementia.  I will sit down with the patient and her family and go over the results of the current evaluation and we will continue to look at practical issues around adjusting to coping with a progressive dementia of the Alzheimer's type.   Diagnosis:    Early onset Alzheimer's dementia without behavioral disturbance (HCC)  Memory loss  Depression with anxiety  Obstructive sleep apnea syndrome   Arley Phenix, Psy.D. Neuropsychologist

## 2020-01-25 ENCOUNTER — Encounter: Payer: Medicare Other | Attending: Psychology | Admitting: Psychology

## 2020-01-25 ENCOUNTER — Other Ambulatory Visit: Payer: Self-pay

## 2020-01-25 ENCOUNTER — Encounter: Payer: Self-pay | Admitting: Psychology

## 2020-01-25 DIAGNOSIS — R413 Other amnesia: Secondary | ICD-10-CM | POA: Diagnosis not present

## 2020-01-25 DIAGNOSIS — G4733 Obstructive sleep apnea (adult) (pediatric): Secondary | ICD-10-CM | POA: Diagnosis not present

## 2020-01-25 DIAGNOSIS — F418 Other specified anxiety disorders: Secondary | ICD-10-CM | POA: Insufficient documentation

## 2020-01-25 DIAGNOSIS — G300 Alzheimer's disease with early onset: Secondary | ICD-10-CM | POA: Diagnosis not present

## 2020-01-25 DIAGNOSIS — F0280 Dementia in other diseases classified elsewhere without behavioral disturbance: Secondary | ICD-10-CM | POA: Insufficient documentation

## 2020-01-25 NOTE — Progress Notes (Signed)
02/04/2020: 8 AM to 9 AM: Today's visit was an in person visit that was conducted in my outpatient clinic office.  The patient and her husband were present for this visit.  Today I provided feedback regarding the results of the most recent neuropsychological evaluation.  I will include the summary and impressions/recommendations at the end of this note with the complete neuropsychological evaluation being available in the patient's EMR dated 01/12/2020.  The patient appeared to understand and comprehend the feedback regarding the more formalized diagnosis of early onset dementia of the Alzheimer's type without behavioral disturbance.  We had preliminary diagnoses of this condition recently after testing 1 year ago.  The pattern of cognitive strengths and weaknesses in the progression of difficulties remains consistent.  The patient is shown progressive loss since evaluation approximately 1 year ago and memory functions and some changes in word finding and other cognitive changes.  The patient had been taking Aricept but took it upon herself personally to stop taking this medicine for reason she could not explain.  Her husband was aware of this and wanted her to start taking it again.  I talked to the patient about medicines that can be used to help reduce some of the symptoms of Alzheimer's that are available including ones that she had been taking.  She is being closely followed by Luanna Cole. Baxley, MD and I encouraged her to follow-up with Dr. Lenord Fellers to readdress this issue of medication possibilities for this diagnosis and condition.  The patient is also been working on getting all of her legal decisions and legal papers in order and I made recommendations about basic metabolic issues and activity issues that can be helpful in at least slowing down progression of this condition including paying very close attention to her metabolic status through regular physical activity, close monitoring and very good dietary  habits, improve sleep efforts and active use of her CPAP machine.  The patient acknowledged that she has become less compliant with her CPAP machine over time and I strongly encouraged her to resume using her CPAP machine every night as well as using it during the day if she takes naps.  The patient and her husband report that the patient has been taking more naps during the day and is more fatigued.  I also suggested that nap should be avoided if the patient is not sleeping well to improve sleep hygiene at night and also encouraged her to follow sun cycles and being outside at sunrise is in sun sets as much as often to help regulate her sleep-wake cycle.  Below you will find the summary of the evaluation that was just completed and again it can be found in her EMR in its entirety dated 01/12/2020.    Summary of Results:                        The patient showed consistent patterns with previous testing but also displayed progressive and ongoing changes with most all neuropsychological domains assessed showing significant loss over the past year.  The patient showed significant further deterioration and her global cognitive functioning as well as further deterioration in her verbal fluency and word finding abilities.  The patient had severe deficits with regard to learning and memory on the initial assessment and the same patterns were still identified on the current assessment.  Impression/Diagnosis:  Overall, the results of the current neuropsychological evaluation in comparison to both the previous neuropsychological evaluation done several years ago by Dr. Leonides Cave and the evaluation done in 2020 by myself are consistent with the significant overall loss of functioning in the domains of memory and learning, information processing speed, visual perceptual and visual spatial abilities with preserved visual constructional abilities, and severe memory and learning deficits with relatively  preserved auditory and visual encoding abilities.  The patient does have a past history of depression anxiety as well as chronic stress situations and has been diagnosed with obstructive sleep apnea without good compliance with her CPAP machine although it has improved.  As far as diagnostic consideration, the pattern of successive changes and progressive worsening in the pattern of strengths and weaknesses on objective cognitive assessments would continue to be consistent with patterns of Alzheimer's dementia and given the initial presentation prior to age 40 a formal diagnosis of early onset Alzheimer's dementia without behavioral disturbance. The patient is continuing to show progressive deterioration in her overall functioning.  While earlier evaluations primarily showed initial deficits relative to temporal lobe and hippocampal functioning she is showing patterns consistent with further deterioration in frontal and parietal lobe regions as well.  The level of impairments could not be explained simply by depression anxiety or untreated sleep apnea and the patient does continue to do relatively well on encoding measures and she is not shown further deterioration in information processing speed that would be consistent with primarily small vessel disease types of conditions.  There are no symptoms related to features that differentiate Alzheimer's from Lewy body dementia primarily and therefore it does appear to be that this is a very consistent and distinctive pattern of early onset Alzheimer's dementia.  I will sit down with the patient and her family and go over the results of the current evaluation and we will continue to look at practical issues around adjusting to coping with a progressive dementia of the Alzheimer's type.   Diagnosis:                               Early onset Alzheimer's dementia without behavioral disturbance (HCC)  Memory loss  Depression with anxiety  Obstructive  sleep apnea syndrome   Arley Phenix, Psy.D. Neuropsychologist

## 2020-02-22 ENCOUNTER — Ambulatory Visit: Payer: Medicare Other | Admitting: Adult Health

## 2020-02-22 ENCOUNTER — Encounter: Payer: Self-pay | Admitting: Adult Health

## 2020-02-23 ENCOUNTER — Encounter: Payer: Self-pay | Admitting: Internal Medicine

## 2020-02-23 DIAGNOSIS — Z1211 Encounter for screening for malignant neoplasm of colon: Secondary | ICD-10-CM | POA: Diagnosis not present

## 2020-02-23 LAB — COLOGUARD: Cologuard: NEGATIVE

## 2020-03-01 LAB — COLOGUARD: Cologuard: NEGATIVE

## 2020-03-03 ENCOUNTER — Other Ambulatory Visit: Payer: Self-pay | Admitting: Internal Medicine

## 2020-03-04 LAB — COLOGUARD
COLOGUARD: NEGATIVE
Cologuard: NEGATIVE

## 2020-03-05 ENCOUNTER — Encounter: Payer: Self-pay | Admitting: Internal Medicine

## 2020-03-20 DIAGNOSIS — Z6830 Body mass index (BMI) 30.0-30.9, adult: Secondary | ICD-10-CM | POA: Diagnosis not present

## 2020-03-20 DIAGNOSIS — M2550 Pain in unspecified joint: Secondary | ICD-10-CM | POA: Diagnosis not present

## 2020-03-20 DIAGNOSIS — M0579 Rheumatoid arthritis with rheumatoid factor of multiple sites without organ or systems involvement: Secondary | ICD-10-CM | POA: Diagnosis not present

## 2020-03-20 DIAGNOSIS — R5382 Chronic fatigue, unspecified: Secondary | ICD-10-CM | POA: Diagnosis not present

## 2020-03-20 DIAGNOSIS — E669 Obesity, unspecified: Secondary | ICD-10-CM | POA: Diagnosis not present

## 2020-03-20 DIAGNOSIS — M150 Primary generalized (osteo)arthritis: Secondary | ICD-10-CM | POA: Diagnosis not present

## 2020-03-22 ENCOUNTER — Telehealth: Payer: Self-pay | Admitting: Internal Medicine

## 2020-03-22 MED ORDER — POLYSACCHARIDE IRON COMPLEX 150 MG PO CAPS: 150.0000 mg | ORAL_CAPSULE | Freq: Two times a day (BID) | ORAL | 2 refills | Status: DC

## 2020-03-22 NOTE — Telephone Encounter (Signed)
Spoke with husband, Corry Storie. Patient has hx of iron deficiency anemia.  Patient has memory loss issues.  She did not want to do colonoscopy and we did Cologuard instead recently which was negative.  Recent labs done by rheumatologist showed her hemoglobin to be 9.5 g.  Husband says she is taking her iron supplement once a day.  We are going to call in Niferex 150 mg twice daily and follow-up week of January 10.  They will be traveling for the holidays.  I talked with him about referral to Hematology.  If anemia has not improved by January 18 we will need to consider that.  She may need iron infusion or other evaluation for potential bone  marrow dyscrasia.  Spoke with him about this as well.  Appointment made for w33k of January 10.

## 2020-05-01 ENCOUNTER — Encounter: Payer: Self-pay | Admitting: Internal Medicine

## 2020-05-01 ENCOUNTER — Other Ambulatory Visit: Payer: Self-pay

## 2020-05-01 ENCOUNTER — Ambulatory Visit (INDEPENDENT_AMBULATORY_CARE_PROVIDER_SITE_OTHER): Payer: Medicare Other | Admitting: Internal Medicine

## 2020-05-01 VITALS — BP 160/100 | HR 67 | Ht 62.0 in | Wt 157.0 lb

## 2020-05-01 DIAGNOSIS — I10 Essential (primary) hypertension: Secondary | ICD-10-CM | POA: Diagnosis not present

## 2020-05-01 DIAGNOSIS — E039 Hypothyroidism, unspecified: Secondary | ICD-10-CM | POA: Diagnosis not present

## 2020-05-01 DIAGNOSIS — E611 Iron deficiency: Secondary | ICD-10-CM | POA: Diagnosis not present

## 2020-05-01 DIAGNOSIS — D649 Anemia, unspecified: Secondary | ICD-10-CM | POA: Diagnosis not present

## 2020-05-01 DIAGNOSIS — G4733 Obstructive sleep apnea (adult) (pediatric): Secondary | ICD-10-CM

## 2020-05-01 DIAGNOSIS — R413 Other amnesia: Secondary | ICD-10-CM

## 2020-05-01 DIAGNOSIS — R7302 Impaired glucose tolerance (oral): Secondary | ICD-10-CM

## 2020-05-01 MED ORDER — SERTRALINE HCL 100 MG PO TABS
100.0000 mg | ORAL_TABLET | Freq: Every day | ORAL | 2 refills | Status: DC
Start: 2020-05-01 — End: 2021-06-29

## 2020-05-01 MED ORDER — DONEPEZIL HCL 10 MG PO TABS
10.0000 mg | ORAL_TABLET | Freq: Every day | ORAL | 1 refills | Status: DC
Start: 2020-05-01 — End: 2022-12-04

## 2020-05-01 NOTE — Progress Notes (Signed)
   Subjective:    Patient ID: [name], female    DOB: [birth year], 70 y.o.   MRN: [id number]  HPI 70 year old Female seen today for follow up on iron deficiency anemia and memory loss.    It seems that she has not been consistent with some of her medications.  Her husband accompanies her today.  Her blood pressure is elevated at 160/100.  She admits to not taking her antihypertensive medication today.  She is not taking her medication for memory loss either.  She missed an appointment with Dr. Frances Furbish recently.  Husband is supportive and doesn't want to upset her.  Also of concern is iron level of 17 when checked in August.  She has not had follow-up since that time.  Today CBC iron iron-binding capacity ferritin and reticulocyte count for ordered.  She has not had Aricept refilled so I refilled it today 10 mg daily.  She is on levothyroxine 50 mcg daily.  TSH in July was normal on that dose.  Not clear if she is taking this either.  History of hyperlipidemia and in July total cholesterol was 204, HDL 48, triglycerides 71 and LDL cholesterol 139.  Husband says she is taking Crestor 5 mg daily.  Has not taken antidepressant in a while.  Zoloft 100 mg was refilled today.  Does take Maxide 25 for hypertension in addition to Zestril 10 mg daily.  Husband says she takes Xanax when anxious and Xanax 0.5 mg number 60 tablets one twice daily if needed was prescribed today.  Apparently has not had that filled recently.  Saw Dr. Kieth Brightly in October.  Felt her dementia had progressed over the past year.  Explained that to her husband today.  However she is able to subtract 7 from 107 from 93.  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  However she struggles with day of week, year, president of the [place].  Family history of dementia in her mother  Review of Systems husband says patient eats brunch about noon with breakfast food and eats well for dinner.   Generally eats about 2 meals a day.     Objective:   Physical Exam She is alert and cooperative.  Neck is supple.  Chest clear to auscultation.  Cardiac exam regular rate and rhythm.  Doesn't seem comfortable answering questions such as what is the day of the week, the year, the month and seems to struggle for the answers.       Assessment & Plan:  Memory loss-have refilled Aricept 10 mg daily.  Reschedule appointment with Dr. Frances Furbish  Iron deficiency-iron level, CBC and retake count checked today.  Has not had colonoscopy since 2011.  She really doesn't want to have colonoscopy.  Cologuard was negative.  This may be nutritional.  Essential hypertension- advised husband patient needs to take medication daily.  Depression have refilled Zoloft.  Hypothyroidism currently on levothyroxine 50 mcg daily.  TSH checked in July was normal at 3.16.  History of rheumatoid arthritis followed at [facility]  History of sleep apnea.  Sometimes does not wear CPAP according to her husband.    Pure hypercholesterolemia treated with statin medication Crestor 5 mg daily.  Prescription runs out in March 2022  Plan: We will review her CBC and make further recommendations for follow-up.  She had a Medicare wellness visit in August.

## 2020-05-01 NOTE — Patient Instructions (Signed)
Depending upon iron level on labs drawn today, we will make further recommendations.  We will try to reschedule with Dr.Athar but you may have to contact him on your own.  I have prescribed Aricept 10 mg daily.

## 2020-05-02 LAB — CBC WITH DIFFERENTIAL/PLATELET
Absolute Monocytes: 695 cells/uL (ref 200–950)
Basophils Absolute: 34 cells/uL (ref 0–200)
Basophils Relative: 0.3 %
Eosinophils Absolute: 46 cells/uL (ref 15–500)
Eosinophils Relative: 0.4 %
HCT: 32 % — ABNORMAL LOW (ref 35.0–45.0)
Hemoglobin: 10.5 g/dL — ABNORMAL LOW (ref 11.7–15.5)
Lymphs Abs: 2120 cells/uL (ref 850–3900)
MCH: 27.4 pg (ref 27.0–33.0)
MCHC: 32.8 g/dL (ref 32.0–36.0)
MCV: 83.6 fL (ref 80.0–100.0)
MPV: 10.5 fL (ref 7.5–12.5)
Monocytes Relative: 6.1 %
Neutro Abs: 8504 cells/uL — ABNORMAL HIGH (ref 1500–7800)
Neutrophils Relative %: 74.6 %
Platelets: 617 10*3/uL — ABNORMAL HIGH (ref 140–400)
RBC: 3.83 10*6/uL (ref 3.80–5.10)
RDW: 14.9 % (ref 11.0–15.0)
Total Lymphocyte: 18.6 %
WBC: 11.4 10*3/uL — ABNORMAL HIGH (ref 3.8–10.8)

## 2020-05-02 LAB — RETICULOCYTES
ABS Retic: 65110 cells/uL (ref 20000–8000)
Retic Ct Pct: 1.7 %

## 2020-05-02 LAB — IRON,TIBC AND FERRITIN PANEL
%SAT: 7 % (calc) — ABNORMAL LOW (ref 16–45)
Ferritin: 231 ng/mL (ref 16–288)
Iron: 16 ug/dL — ABNORMAL LOW (ref 45–160)
TIBC: 233 mcg/dL (calc) — ABNORMAL LOW (ref 250–450)

## 2020-05-09 ENCOUNTER — Other Ambulatory Visit: Payer: Self-pay | Admitting: *Deleted

## 2020-05-09 NOTE — Patient Outreach (Signed)
[facility]) Care Management  05/09/2020  KRYSTIN KEEVEN [birth year] [id number]  New patient assessment, referred from Dr. Marlan Palau for Anemia (dietary coaching), HTN.  Spoke with Mrs. And Mr. Vandergriff today with Mrs. Cardon permission. She agrees to participate in [facility] care management program.  We discussed the reasons Dr. Lenord Fellers made the referral, mainly for dietary counseling. Discussed the need for good dietary intake of iron rich foods. Encouraged meat proteins in 3 oz portions and dark green vegetables to start. NP to send Food list for more choices.   Encouraged medication recommendations and compliance.  Discussed need for home monitoring for a couple of weeks to get a baseline. Noted OV BP was 160/100.  NP to send [facility] information, calendar and diet lists.  Mrs. Tan agreed to talk with me again next week. We will try to complete her initial assessment components.  [name]. Burgess Estelle, MSN, [facility] Gerontological Nurse Practitioner [facility] Care Management [phone]

## 2020-05-14 DIAGNOSIS — M2550 Pain in unspecified joint: Secondary | ICD-10-CM | POA: Diagnosis not present

## 2020-05-14 DIAGNOSIS — Z6828 Body mass index (BMI) 28.0-28.9, adult: Secondary | ICD-10-CM | POA: Diagnosis not present

## 2020-05-14 DIAGNOSIS — M150 Primary generalized (osteo)arthritis: Secondary | ICD-10-CM | POA: Diagnosis not present

## 2020-05-14 DIAGNOSIS — E663 Overweight: Secondary | ICD-10-CM | POA: Diagnosis not present

## 2020-05-14 DIAGNOSIS — Z79899 Other long term (current) drug therapy: Secondary | ICD-10-CM | POA: Diagnosis not present

## 2020-05-14 DIAGNOSIS — M0579 Rheumatoid arthritis with rheumatoid factor of multiple sites without organ or systems involvement: Secondary | ICD-10-CM | POA: Diagnosis not present

## 2020-05-14 DIAGNOSIS — R5382 Chronic fatigue, unspecified: Secondary | ICD-10-CM | POA: Diagnosis not present

## 2020-05-16 ENCOUNTER — Other Ambulatory Visit: Payer: Self-pay | Admitting: *Deleted

## 2020-05-16 ENCOUNTER — Encounter: Payer: Self-pay | Admitting: *Deleted

## 2020-05-16 NOTE — Patient Outreach (Signed)
[facility]) Care Management  05/16/2020  [name] June 13, 1950 [id number]  Telephone outreach for follow up anemia and HTN.  Spoke with Mr. [name], Mrs. [name] present and listening to call (pt has dementia).  Mr. [name] verifies they have received all the educational materials sent: Iron supplementation diet, Advanced Directives, [facility] Disease Mgmt Calendar.  Mr. [name] reports his wife is doing well. She enjoys the food he is fixing her. He did note she missed a couple of doses of her medications this week.  Developed care plan today with Mr. [name] per his percived priorities.  Goals Addressed            This Visit's Progress   . Planning for Long-Term Care-Dementia       Timeframe:  Long-Range Goal Priority:  Medium Start Date:     05/16/20                        Expected End Date:   07/19/20                    Follow Up Date 06/18/20   - check out other places when staying at home is no longer possible (assisted living center, nursing home) - check out services like in-home help or adult day care - complete a Power of [name]; include caregiver - list the symptoms that would make staying at home too hard - make a list of future care and financial needs    Why is this important?    Learning that you or your loved one has dementia can be scary and stressful.   You can reduce stress by planning.   Preparing for the future is one of the most important things to do.   Thinking about how much care you/your loved one will need and how much it will cost is not easy.   Early on, you/your loved one can be part of making decisions for the future.   Making sure that your/your loved one's wishes for care are known is important.     Notes:     . Track and Manage My Blood Pressure-Hypertension       Timeframe:  Short-Term Goal Priority:  High Start Date:       05/16/20                      Expected End Date:   06/18/20                    Follow Up Date  06/18/20   - check blood pressure daily - write blood pressure results in a log or diary    Why is this important?    You won't feel high blood pressure, but it can still hurt your blood vessels.   High blood pressure can cause heart or kidney problems. It can also cause a stroke.   Making lifestyle changes like losing a little weight or eating less salt will help.   Checking your blood pressure at home and at different times of the day can help to control blood pressure.   If the doctor prescribes medicine remember to take it the way the doctor ordered.   Call the office if you cannot afford the medicine or if there are questions about it.     Notes: Husband has already begun to do daily BP checks. Reports average is 130-140/70-80s. Husband noted wife did not take  2 doses of her meds and acknowledges a need to oversee and ensure she is taking her meds. She follows a low salt diet already.      We are also addressing having an iron rich diet.Educational materials given and discussed.  We agreed to talk again in one week.  [name]. Brianna Estelle, MSN, [facility] Gerontological Nurse Practitioner [facility] Care Management [phone]

## 2020-05-23 ENCOUNTER — Other Ambulatory Visit: Payer: Self-pay | Admitting: *Deleted

## 2020-05-23 NOTE — Patient Outreach (Signed)
[facility]) Care Management  05/23/2020  [name] 06/15/1950 [id number]   Telephone outreach. Spoke with [name] today, who speaks for his wife who has dementia.  Goals Addressed            This Visit's Progress   . Planning for Long-Term Care-Dementia by the end of March 2022.       Timeframe:  Long-Range Goal Priority:  Medium Start Date:     05/16/20                        Expected End Date:   07/19/20                    Follow Up Date 06/18/20   - check out other places when staying at home is no longer possible (assisted living center, nursing home) - check out services like in-home help or adult day care - complete a Power of Attorney; include caregiver - list the symptoms that would make staying at home too hard - make a list of future care and financial needs    Why is this important?    Learning that you or your loved one has dementia can be scary and stressful.   You can reduce stress by planning.   Preparing for the future is one of the most important things to do.   Thinking about how much care you/your loved one will need and how much it will cost is not easy.   Early on, you/your loved one can be part of making decisions for the future.   Making sure that your/your loved one's wishes for care are known is important.     Notes: 05/23/20 Introduced the idea of discussing the long term plan for [name] on our next call.    . Track and Manage My Blood Pressure-Hypertension at home at least 3 times a week and report to NP on phone calls by the end of Feb 2022       Timeframe:  Short-Term Goal Priority:  High Start Date:       05/16/20                      Expected End Date:   06/18/20                    Follow Up Date 06/18/20   - check blood pressure daily - write blood pressure results in a log or diary    Why is this important?    You won't feel high blood pressure, but it can still hurt your blood vessels.   High blood  pressure can cause heart or kidney problems. It can also cause a stroke.   Making lifestyle changes like losing a little weight or eating less salt will help.   Checking your blood pressure at home and at different times of the day can help to control blood pressure.   If the doctor prescribes medicine remember to take it the way the doctor ordered.   Call the office if you cannot afford the medicine or if there are questions about it.     Notes: Husband has already begun to do daily BP checks. Reports average is 130-140/70-80s. Husband noted wife did not take 2 doses of her meds and acknowledges a need to oversee and ensure she is taking her meds. She follows a low salt diet already. 05/23/20 Did not  get BP report, however, [name] is overseeing [name] taking her meds and she did not miss any doses this week.      We agreed to talk again in one week.  [name]. Burgess Estelle, MSN, [facility] Gerontological Nurse Practitioner [facility] Care Management [phone]

## 2020-05-30 ENCOUNTER — Other Ambulatory Visit: Payer: Self-pay

## 2020-05-30 ENCOUNTER — Other Ambulatory Visit: Payer: Self-pay | Admitting: *Deleted

## 2020-05-30 NOTE — Patient Outreach (Signed)
[facility]) Care Management  05/30/2020  [name] [birth year] [id number]  Telephone outreach to follow up on HTN, Iron deficiency and long range plans.  Mr. Saxby reports he did received the materials on eating for iron deficiency and says it has helped him in planning meals. She will see Dr. Lenord Fellers next month for a follow up.  Goals Addressed            This Visit's Progress   . Planning for Long-Term Care-Dementia by the end of March 2022.       Timeframe:  Long-Range Goal Priority:  Medium Start Date:     05/16/20                        Expected End Date:   07/19/20                    Follow Up Date 06/18/20   - check out other places when staying at home is no longer possible (assisted living center, nursing home) - check out services like in-home help or adult day care - complete a Power of Attorney; include caregiver - list the symptoms that would make staying at home too hard - make a list of future care and financial needs    Why is this important?    Learning that you or your loved one has dementia can be scary and stressful.   You can reduce stress by planning.   Preparing for the future is one of the most important things to do.   Thinking about how much care you/your loved one will need and how much it will cost is not easy.   Early on, you/your loved one can be part of making decisions for the future.   Making sure that your/your loved one's wishes for care are known is important.     Notes: 05/23/20 Introduce the idea of discussing the long term plan for [name] on our next call. 05/30/20 Discussed LTC plan with pt husband. They really don't have one at this time. His sister is a caregiver with an agency and she could provide some assistance but they are not there yet. Counseled on discussing with his wife and sister the scenario if [name] needs more oversight that just husband alone for a start. Is his sister willing to be her  caregiver? Advised it is best to have the plan in mind before needing it.    . Track and Manage My Blood Pressure-Hypertension at home at least 3 times a week and report to NP on phone calls by the end of Feb 2022       Timeframe:  Short-Term Goal Priority:  High Start Date:       05/16/20                      Expected End Date:   06/18/20                    Follow Up Date 06/18/20   - check blood pressure daily - write blood pressure results in a log or diary    Why is this important?    You won't feel high blood pressure, but it can still hurt your blood vessels.   Making lifestyle changes like losing a little weight or eating less salt will help.   Checking your blood pressure at home and at different times of the  day can help to control blood pressure.   If the doctor prescribes medicine remember to take it the way the doctor ordered.   Call the office if you cannot afford the medicine or if there are questions about it.     Notes: Husband has already begun to do daily BP checks. Reports average is 130-140/70-80s. Husband noted wife did not take 2 doses of her meds and acknowledges a need to oversee and ensure she is taking her meds. She follows a low salt diet already. 05/23/20 Did not get BP report, however, Mr. Wanamaker is overseeing [name] taking her meds and she did not miss any doses this week. 05/30/20 Husband reports one BP reading today 132/64. Admits to not checking at least 3 times a week and says he will try to do better. Meds are being taken as ordered.      We will talk again next month after her visit with her MD.  Noralyn Pick C. Burgess Estelle, MSN, [facility] Gerontological Nurse Practitioner [facility] Care Management [phone]

## 2020-06-15 ENCOUNTER — Other Ambulatory Visit: Payer: Self-pay | Admitting: Internal Medicine

## 2020-06-27 DIAGNOSIS — M0579 Rheumatoid arthritis with rheumatoid factor of multiple sites without organ or systems involvement: Secondary | ICD-10-CM | POA: Diagnosis not present

## 2020-06-27 DIAGNOSIS — R5382 Chronic fatigue, unspecified: Secondary | ICD-10-CM | POA: Diagnosis not present

## 2020-06-27 DIAGNOSIS — Z6829 Body mass index (BMI) 29.0-29.9, adult: Secondary | ICD-10-CM | POA: Diagnosis not present

## 2020-06-27 DIAGNOSIS — E663 Overweight: Secondary | ICD-10-CM | POA: Diagnosis not present

## 2020-06-27 DIAGNOSIS — M2550 Pain in unspecified joint: Secondary | ICD-10-CM | POA: Diagnosis not present

## 2020-06-27 DIAGNOSIS — M150 Primary generalized (osteo)arthritis: Secondary | ICD-10-CM | POA: Diagnosis not present

## 2020-06-29 ENCOUNTER — Other Ambulatory Visit: Payer: Self-pay

## 2020-06-29 ENCOUNTER — Other Ambulatory Visit: Payer: Medicare Other | Admitting: Internal Medicine

## 2020-06-29 DIAGNOSIS — E611 Iron deficiency: Secondary | ICD-10-CM

## 2020-06-29 DIAGNOSIS — D649 Anemia, unspecified: Secondary | ICD-10-CM | POA: Diagnosis not present

## 2020-06-29 DIAGNOSIS — R413 Other amnesia: Secondary | ICD-10-CM | POA: Diagnosis not present

## 2020-06-30 LAB — CBC WITH DIFFERENTIAL/PLATELET
Absolute Monocytes: 876 cells/uL (ref 200–950)
Basophils Absolute: 36 cells/uL (ref 0–200)
Basophils Relative: 0.3 %
Eosinophils Absolute: 72 cells/uL (ref 15–500)
Eosinophils Relative: 0.6 %
HCT: 31.4 % — ABNORMAL LOW (ref 35.0–45.0)
Hemoglobin: 10 g/dL — ABNORMAL LOW (ref 11.7–15.5)
Lymphs Abs: 2136 cells/uL (ref 850–3900)
MCH: 26.4 pg — ABNORMAL LOW (ref 27.0–33.0)
MCHC: 31.8 g/dL — ABNORMAL LOW (ref 32.0–36.0)
MCV: 82.8 fL (ref 80.0–100.0)
MPV: 10 fL (ref 7.5–12.5)
Monocytes Relative: 7.3 %
Neutro Abs: 8880 cells/uL — ABNORMAL HIGH (ref 1500–7800)
Neutrophils Relative %: 74 %
Platelets: 658 10*3/uL — ABNORMAL HIGH (ref 140–400)
RBC: 3.79 10*6/uL — ABNORMAL LOW (ref 3.80–5.10)
RDW: 15.7 % — ABNORMAL HIGH (ref 11.0–15.0)
Total Lymphocyte: 17.8 %
WBC: 12 10*3/uL — ABNORMAL HIGH (ref 3.8–10.8)

## 2020-06-30 LAB — IRON,TIBC AND FERRITIN PANEL
%SAT: 7 % (calc) — ABNORMAL LOW (ref 16–45)
Ferritin: 312 ng/mL — ABNORMAL HIGH (ref 16–288)
Iron: 16 ug/dL — ABNORMAL LOW (ref 45–160)
TIBC: 232 mcg/dL (calc) — ABNORMAL LOW (ref 250–450)

## 2020-07-02 ENCOUNTER — Encounter: Payer: Self-pay | Admitting: Internal Medicine

## 2020-07-02 ENCOUNTER — Other Ambulatory Visit: Payer: Self-pay

## 2020-07-02 ENCOUNTER — Ambulatory Visit (INDEPENDENT_AMBULATORY_CARE_PROVIDER_SITE_OTHER): Payer: Medicare Other | Admitting: Internal Medicine

## 2020-07-02 VITALS — BP 110/70 | HR 70 | Ht 62.0 in | Wt 152.0 lb

## 2020-07-02 DIAGNOSIS — E039 Hypothyroidism, unspecified: Secondary | ICD-10-CM | POA: Diagnosis not present

## 2020-07-02 DIAGNOSIS — R413 Other amnesia: Secondary | ICD-10-CM

## 2020-07-02 DIAGNOSIS — I10 Essential (primary) hypertension: Secondary | ICD-10-CM | POA: Diagnosis not present

## 2020-07-02 DIAGNOSIS — D509 Iron deficiency anemia, unspecified: Secondary | ICD-10-CM | POA: Diagnosis not present

## 2020-07-02 DIAGNOSIS — Z8659 Personal history of other mental and behavioral disorders: Secondary | ICD-10-CM | POA: Diagnosis not present

## 2020-07-02 DIAGNOSIS — G4733 Obstructive sleep apnea (adult) (pediatric): Secondary | ICD-10-CM | POA: Diagnosis not present

## 2020-07-02 NOTE — Patient Instructions (Addendum)
Referral to Hematology regarding iron deficiency anemia and consideration of IV iron therapy.  Continue current medications as prescribed.  Take meloxicam only if needed for arthralgias and not on a daily basis.  May take Tylenol.  Continue Neurology follow-up for memory loss

## 2020-07-02 NOTE — Progress Notes (Signed)
Subjective:    Patient ID: [name], female    DOB: 02-Dec-1950, 70 y.o.   MRN: [id number]  HPI 70 year old Female with history of iron deficiency anemia, memory loss, hypertension and hyperlipidemia here for follow-up on iron deficiency.  She is accompanied by her husband, Genevie Cheshire, who is very supportive.  They are retired.  He reports that her appetite is good.  She has been on oral iron supplement twice daily- Ferrex 150 mg.  Unfortunately iron level has not improved.  Also, she remains anemic.  Her iron level is 16 and previously was 16 2 months ago.  Ferritin was 231 and is now 312 but she has a history of rheumatoid arthritis and this could be an elevation due to ferritin being an acute phase reactant.  She had Cologuard test which was negative.  They preferred that over colonoscopy.  She had recent iron studies.  CBC showed hemoglobin of 10 g and had been 10.5 g in January.  Hemoglobin was 10.1 g in July.  In July 2020 hemoglobin was 12.6 g.  MCV remains within normal limits.  White blood cell count is 12,000 it was 11,400 in January and 11,600 in July.  In July 2020 white blood cell count was 8200.  Her platelet count is elevated at 658,000 it was 617,000 in January and was 607,000 in July 2021.  Platelet count was normal in July 2020 at 351,000.  Hemoglobin is 10.0 g.  Her iron level is 16 and was 16 2 months ago.  Ferritin is 312 and was 231 2 months ago.  Iron binding capacity is slightly low at 232 and was 233 2 months ago.  I am going to recommend that she see Hematology.  She may benefit from IV iron infusion.  She has to be reminded it is Spring.  She is pleasant and well-dressed.  She is followed by neurology for memory loss.  She also has rheumatoid arthritis and sees Azucena Fallen at [facility].  Basically is just taking Tylenol right now for pain.  They do have meloxicam on hand and she formally took it on a daily basis but has not done so in some time.  Asked  them to use it only on an as-needed basis on occasion with history of iron deficiency.  Review of Systems eats lunch between 10-2. Likes ice cream.  Basically they eat 2 meals a day which husband prepares.  He looks well-nourished.  She has a good appetite he says.     Objective:   Physical Exam  Vital signs reviewed.  Blood pressure 110/70 pulse 70 regular pulse oximetry 97% weight 152 pounds height 5 feet 2 inches BMI 27.80  Skin warm and dry.  No thyromegaly.  Chest clear to auscultation.  No carotid bruits.  Cardiac exam regular rate and rhythm normal S1 and S2.  No lower extremity pitting edema.  Pleasant and cooperative.      Assessment & Plan:  Iron deficiency anemia-referral to Hematology for consideration of IV iron infusion and evaluation of iron deficiency.  Oral iron supplement which I believe husband is seeing that she gets is not working for her.  Cologuard was negative.  She declined colonoscopy.  Memory loss-followed by Neurology.  Treated with Aricept.  Has upcoming appointment with Neurology.  Memory loss appears to be stable and her husband is supportive.    Essential hypertension-stable on lisinopril 10 mg daily and Maxide 25 daily.  Hyperlipidemia-history of elevated LDL at  139 in July.  Records indicates she is currently not on lipid-lowering medication.  Hypothyroidism treated with levothyroxine 50 mcg daily.  History of depression treated with Zoloft for many years.  History of sleep apnea-has CPAP device.  Plan: Referral to Hematology for evaluation of iron deficiency anemia.  Schedule Annual Medicare wellness visit and health maintenance exam for August 2022 here.  History of rheumatoid arthritis treated with meloxicam and Tylenol.  Currently just taking Tylenol on a regular basis.  Recommend meloxicam just on an as-needed basis infrequently.  Doubt that her iron deficiency this is due to a GI bleed.  She has taken Zoloft for a number of years for anxiety  and depression.  Plan: Hematology referral.  We will schedule annual Medicare wellness and health maintenance exam here for later this Summer.  Stop iron supplementation since it does not seem to be helping.

## 2020-07-03 ENCOUNTER — Other Ambulatory Visit: Payer: Self-pay | Admitting: *Deleted

## 2020-07-03 NOTE — Patient Outreach (Signed)
[facility]) Care Management  [facility] Care Manager  07/03/2020   [name] 09-Apr-1951 [id number]  Subjective: Telephone call for follow up on pt iron deficiency and HTN.  Encounter Medications:  Outpatient Encounter Medications as of 07/03/2020  Medication Sig  . ALPRAZolam (XANAX) 0.5 MG tablet TAKE ONE TABLET BY MOUTH TWICE A DAY AS NEEDED FOR ANXIETY  . Cholecalciferol (VITAMIN D) 1000 UNITS capsule Take 1,000 Units by mouth daily.  . cyanocobalamin 500 MCG tablet Take 500 mcg by mouth daily.  Marland Kitchen donepezil (ARICEPT) 10 MG tablet Take 1 tablet (10 mg total) by mouth at bedtime.  Marland Kitchen FERREX 150 150 MG capsule TAKE ONE CAPSULE BY MOUTH TWICE A DAY  . levothyroxine (SYNTHROID) 50 MCG tablet Take 1 tablet (50 mcg total) by mouth daily.  Marland Kitchen lisinopril (ZESTRIL) 10 MG tablet TAKE ONE TABLET BY MOUTH DAILY  . meloxicam (MOBIC) 15 MG tablet Take 1 tablet (15 mg total) by mouth daily. (Patient not taking: Reported on 07/02/2020)  . Multiple Vitamins-Minerals (MULTIVITAMIN WITH MINERALS) tablet Take 1 tablet by mouth daily.  . sertraline (ZOLOFT) 100 MG tablet Take 1 tablet (100 mg total) by mouth daily.  Marland Kitchen triamterene-hydrochlorothiazide (MAXZIDE-25) 37.5-25 MG tablet TAKE ONE TABLET BY MOUTH DAILY  . vitamin E 180 MG (400 UNITS) capsule Take 400 Units by mouth daily.   No facility-administered encounter medications on file as of 07/03/2020.   Assessment:  Iron deficiency anemia -unresolved with oral supplementation and diet.  HTN- well controlled Goals Addressed              This Visit's Progress     Patient Stated   .  COMPLETED: Husband Stated he will increase food high in iron in family diet over the next month to augment her intake beyond taking iron supplement as evidenced by report and lab result. (pt-stated)        Start date: 05/30/20 High priority Short term Expected end date: 07/03/20  05/30/20 Nurse has send information of high iron foods family may include  in daily meals to augment the oral iron supplement. Mr. Jurczyk states he received the information and found it very helpful. They are including dark greens and meat proteins daily. Praised for him providing this suggestion, added that it benefits the both of them. 07/03/20 Talked with Mr. Koslosky, who reports they went to see pt primary care yesterday for lab follow up. Her Iron levels are virtually the same even with the iron supplementation and increase in dietary iron. MD is referring to hematology. Iron supplement discontinued..Anticipate pt having to have iron infusions.      Other   .  Planning for Long-Term Care-Dementia by the end of June 2022.        Timeframe:  Long-Range Goal Priority:  Medium Start Date:     05/16/20 , Renewed 07/03/20                       Expected End Date:   07/19/20 ,Renew 10/18/20                   Follow Up Date  10/18/20 - check out other places when staying at home is no longer possible (assisted living center, nursing home) - check out services like in-home help or adult day care - complete a Power of [place]; include caregiver - list the symptoms that would make staying at home too hard - make a list of future care and  financial needs    Why is this important?    Learning that you or your loved one has dementia can be scary and stressful.   You can reduce stress by planning.   Preparing for the future is one of the most important things to do.   Thinking about how much care you/your loved one will need and how much it will cost is not easy.   Early on, you/your loved one can be part of making decisions for the future.   Making sure that your/your loved one's wishes for care are known is important.     Notes: 05/23/20 Introduce the idea of discussing the long term plan for Mrs. Gurganus on our next call. 05/30/20 Discussed LTC plan with pt husband. They really don't have one at this time. His sister is a caregiver with an agency and she could provide some  assistance but they are not there yet. Counseled on discussing with his wife and sister the scenario if Mrs. Pokorney needs more oversight that just husband alone for a start. Is his sister willing to be her caregiver? Advised it is best to have the plan in mind before needing it. 07/03/20 Did not discuss today. We agreed to talk again in 3 months and will get thoughts and plans at that time. Sent message to Mr. Sandy to advise of our next call date 10/04/20 and this topic to discuss.    .  COMPLETED: Track and Manage My Blood Pressure-Hypertension at home at least 3 times a week and report to NP on phone calls by the end of Feb 2022        Timeframe:  Short-Term Goal Priority:  High Start Date:       05/16/20                      Expected End Date:   06/18/20                    Follow Up Date 06/18/20   - check blood pressure daily - write blood pressure results in a log or diary    Why is this important?    You won't feel high blood pressure, but it can still hurt your blood vessels.   Making lifestyle changes like losing a little weight or eating less salt will help.   Checking your blood pressure at home and at different times of the day can help to control blood pressure.   If the doctor prescribes medicine remember to take it the way the doctor ordered.   Call the office if you cannot afford the medicine or if there are questions about it.     Notes: Husband has already begun to do daily BP checks. Reports average is 130-140/70-80s. Husband noted wife did not take 2 doses of her meds and acknowledges a need to oversee and ensure she is taking her meds. She follows a low salt diet already. 05/23/20 Did not get BP report, however, Mr. Crossett is overseeing Mrs. Hoare taking her meds and she did not miss any doses this week. 05/30/20 Husband reports one BP reading today 132/64. Admits to not checking at least 3 times a week and says he will try to do better. Meds are being taken as ordered. 07/03/20  Blood pressure readings are all <130/80. MD is very happy with her levels and management. Reinforced to husband that he can call with questions and encouraged his continued vigilance on heart  healthy, low salt diet.       Plan:  We agreed to talk again in 3 months for follow up and last call.  Follow-up:  (Husband)  Patient agrees to Care Plan and Follow-up.   [name]. Burgess Estelle, MSN, [facility] Gerontological Nurse Practitioner [facility] Care Management [phone]

## 2020-07-09 ENCOUNTER — Telehealth: Payer: Self-pay | Admitting: *Deleted

## 2020-07-09 NOTE — Telephone Encounter (Signed)
Per referral - Dr. Lenord Fellers - gave upcoming appointments - mailed calendar with welcome packet

## 2020-07-21 ENCOUNTER — Other Ambulatory Visit: Payer: Self-pay | Admitting: Internal Medicine

## 2020-07-31 DIAGNOSIS — M0579 Rheumatoid arthritis with rheumatoid factor of multiple sites without organ or systems involvement: Secondary | ICD-10-CM | POA: Diagnosis not present

## 2020-07-31 DIAGNOSIS — E663 Overweight: Secondary | ICD-10-CM | POA: Diagnosis not present

## 2020-07-31 DIAGNOSIS — M2550 Pain in unspecified joint: Secondary | ICD-10-CM | POA: Diagnosis not present

## 2020-07-31 DIAGNOSIS — Z6829 Body mass index (BMI) 29.0-29.9, adult: Secondary | ICD-10-CM | POA: Diagnosis not present

## 2020-07-31 DIAGNOSIS — M150 Primary generalized (osteo)arthritis: Secondary | ICD-10-CM | POA: Diagnosis not present

## 2020-07-31 DIAGNOSIS — R5382 Chronic fatigue, unspecified: Secondary | ICD-10-CM | POA: Diagnosis not present

## 2020-08-03 ENCOUNTER — Ambulatory Visit: Payer: Medicare Other | Admitting: *Deleted

## 2020-08-06 ENCOUNTER — Other Ambulatory Visit: Payer: Self-pay | Admitting: Family

## 2020-08-06 DIAGNOSIS — D649 Anemia, unspecified: Secondary | ICD-10-CM

## 2020-08-07 ENCOUNTER — Other Ambulatory Visit: Payer: Self-pay

## 2020-08-07 ENCOUNTER — Inpatient Hospital Stay (HOSPITAL_BASED_OUTPATIENT_CLINIC_OR_DEPARTMENT_OTHER): Payer: Medicare Other | Admitting: Family

## 2020-08-07 ENCOUNTER — Inpatient Hospital Stay: Payer: Medicare Other | Attending: Hematology & Oncology

## 2020-08-07 ENCOUNTER — Encounter: Payer: Self-pay | Admitting: Family

## 2020-08-07 DIAGNOSIS — Z8042 Family history of malignant neoplasm of prostate: Secondary | ICD-10-CM

## 2020-08-07 DIAGNOSIS — D509 Iron deficiency anemia, unspecified: Secondary | ICD-10-CM | POA: Diagnosis not present

## 2020-08-07 DIAGNOSIS — Z800 Family history of malignant neoplasm of digestive organs: Secondary | ICD-10-CM | POA: Diagnosis not present

## 2020-08-07 DIAGNOSIS — D649 Anemia, unspecified: Secondary | ICD-10-CM

## 2020-08-07 LAB — CBC WITH DIFFERENTIAL (CANCER CENTER ONLY)
Abs Immature Granulocytes: 0.04 10*3/uL (ref 0.00–0.07)
Basophils Absolute: 0 10*3/uL (ref 0.0–0.1)
Basophils Relative: 0 %
Eosinophils Absolute: 0.1 10*3/uL (ref 0.0–0.5)
Eosinophils Relative: 1 %
HCT: 32.7 % — ABNORMAL LOW (ref 36.0–46.0)
Hemoglobin: 10.1 g/dL — ABNORMAL LOW (ref 12.0–15.0)
Immature Granulocytes: 0 %
Lymphocytes Relative: 18 %
Lymphs Abs: 2.3 10*3/uL (ref 0.7–4.0)
MCH: 26.1 pg (ref 26.0–34.0)
MCHC: 30.9 g/dL (ref 30.0–36.0)
MCV: 84.5 fL (ref 80.0–100.0)
Monocytes Absolute: 1 10*3/uL (ref 0.1–1.0)
Monocytes Relative: 8 %
Neutro Abs: 9.3 10*3/uL — ABNORMAL HIGH (ref 1.7–7.7)
Neutrophils Relative %: 73 %
Platelet Count: 617 10*3/uL — ABNORMAL HIGH (ref 150–400)
RBC: 3.87 MIL/uL (ref 3.87–5.11)
RDW: 18.9 % — ABNORMAL HIGH (ref 11.5–15.5)
WBC Count: 12.7 10*3/uL — ABNORMAL HIGH (ref 4.0–10.5)
nRBC: 0 % (ref 0.0–0.2)

## 2020-08-07 LAB — CMP (CANCER CENTER ONLY)
ALT: 18 U/L (ref 0–44)
AST: 15 U/L (ref 15–41)
Albumin: 3.9 g/dL (ref 3.5–5.0)
Alkaline Phosphatase: 113 U/L (ref 38–126)
Anion gap: 8 (ref 5–15)
BUN: 21 mg/dL (ref 8–23)
CO2: 27 mmol/L (ref 22–32)
Calcium: 10.4 mg/dL — ABNORMAL HIGH (ref 8.9–10.3)
Chloride: 100 mmol/L (ref 98–111)
Creatinine: 0.74 mg/dL (ref 0.44–1.00)
GFR, Estimated: >60 mL/min (ref >60)
Glucose, Bld: 87 mg/dL (ref 70–99)
Potassium: 4.1 mmol/L (ref 3.5–5.1)
Sodium: 135 mmol/L (ref 135–145)
Total Bilirubin: 0.4 mg/dL (ref 0.3–1.2)
Total Protein: 8.6 g/dL — ABNORMAL HIGH (ref 6.5–8.1)

## 2020-08-07 LAB — SAVE SMEAR(SSMR), FOR PROVIDER SLIDE REVIEW

## 2020-08-07 LAB — RETICULOCYTES
Immature Retic Fract: 27.5 % — ABNORMAL HIGH (ref 2.3–15.9)
RBC.: 3.81 MIL/uL — ABNORMAL LOW (ref 3.87–5.11)
Retic Count, Absolute: 106.3 10*3/uL (ref 19.0–186.0)
Retic Ct Pct: 2.8 % (ref 0.4–3.1)

## 2020-08-07 LAB — LACTATE DEHYDROGENASE: LDH: 163 U/L (ref 98–192)

## 2020-08-07 NOTE — Progress Notes (Signed)
Hematology/Oncology Consultation   Name: ENYA BUREAU      MRN: [id number]    Location: Room/bed info not found  Date: 08/07/2020 Time:1:12 PM   REFERRING PHYSICIAN: Marlan Palau, MD  REASON FOR CONSULT: Iron deficiency anemia    DIAGNOSIS: Iron deficiency anemia   HISTORY OF PRESENT ILLNESS: Ms. Zaucha is a very pleasant 70 yo [place] American female with recent diagnosis of IDA. Ferritin was 312 and iron saturation 7%.  She notes fatigue at times.  Her last colonoscopy was in 2011 and she really was not interested in having another. She did do the Cologard and it was negative.  She has not noted any blood loss. No bruising or petechiae.  No known family history of anemia.  No known history of sickle cell disease or trait.  No personal history of cancer. Family history of cancer includes: sister - colon and brother - prostate.  She has 3 children, no history of miscarriage.  She had her mammogram in 12/2019 and results were negative.  No history of diabetes or thyroid disease.  No fever, chills, n/v, cough, rash, dizziness, SOB, chest pain, palpitations, abdominal pain or changes in bowel or bladder habits.  No numbness or tingling in her extremities at this time.  She has significant arthritis effecting her shoulders and ankles. This effects her mobility at times.  She has had knee replacement on both knees.  No falls or syncope to report.  No smoking, ETOH or recreational drug use.  She has maintained a good appetite and is staying well hydrated. Her weight is stable at 158 lbs.  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 She now enjoys spending time with her family, reading and needlepoint/crocheting.      ROS: All other 10 point review of systems is negative.   PAST MEDICAL HISTORY:   Past Medical History:  Diagnosis Date  . Anemia   . Hyperlipidemia   . Hypertension   . Pre-diabetes   . Sleep apnea     ALLERGIES: No Known Allergies     MEDICATIONS:  Current Outpatient Medications on File Prior to Visit  Medication Sig Dispense Refill  . ALPRAZolam (XANAX) 0.5 MG tablet TAKE ONE TABLET BY MOUTH TWICE A DAY AS NEEDED FOR ANXIETY 60 tablet 1  . Cholecalciferol (VITAMIN D) 1000 UNITS capsule Take 1,000 Units by mouth daily.    . cyanocobalamin 500 MCG tablet Take 500 mcg by mouth daily.    Marland Kitchen donepezil (ARICEPT) 10 MG tablet Take 1 tablet (10 mg total) by mouth at bedtime. 90 tablet 1  . FERREX 150 150 MG capsule TAKE ONE CAPSULE BY MOUTH TWICE A DAY 60 capsule 2  . levothyroxine (SYNTHROID) 50 MCG tablet Take 1 tablet (50 mcg total) by mouth daily. 90 tablet 3  . lisinopril (ZESTRIL) 10 MG tablet TAKE ONE TABLET BY MOUTH DAILY 90 tablet 1  . meloxicam (MOBIC) 15 MG tablet Take 1 tablet (15 mg total) by mouth daily. (Patient not taking: Reported on 07/02/2020) 30 tablet 0  . Multiple Vitamins-Minerals (MULTIVITAMIN WITH MINERALS) tablet Take 1 tablet by mouth daily.    . sertraline (ZOLOFT) 100 MG tablet Take 1 tablet (100 mg total) by mouth daily. 90 tablet 2  . triamterene-hydrochlorothiazide (MAXZIDE-25) 37.5-25 MG tablet TAKE ONE TABLET BY MOUTH DAILY 90 tablet 1  . vitamin E 180 MG (400 UNITS) capsule Take 400 Units by mouth daily.     No current facility-administered medications on file prior to  visit.     PAST SURGICAL HISTORY Past Surgical History:  Procedure Laterality Date  . GANGLION CYST EXCISION     l hand  . JOINT REPLACEMENT     knee right  . TUBAL LIGATION      FAMILY HISTORY: Family History  Problem Relation Age of Onset  . Diabetes Mother   . Hypertension Mother   . Kidney disease Father   . Heart disease Father   . Stroke Sister     SOCIAL HISTORY:  reports that she has never smoked. She has never used smokeless tobacco. She reports current alcohol use. She reports that she does not use drugs.  PERFORMANCE STATUS: The patient's performance status is 1 - Symptomatic but completely  ambulatory  PHYSICAL EXAM: Most Recent Vital Signs: There were no vitals taken for this visit. BP (!) 138/56 (Patient Position: Sitting)   Pulse 66   Temp 98.5 F (36.9 C) (Oral)   Resp 18   Ht 5\' 2"  (1.575 m)   Wt 158 lb (71.7 kg)   SpO2 100%   BMI 28.90 kg/m   General Appearance:    Alert, cooperative, no distress, appears stated age  Head:    Normocephalic, without obvious abnormality, atraumatic  Eyes:    PERRL, conjunctiva/corneas clear, EOM's intact, fundi    benign, both eyes        Throat:   Lips, mucosa, and tongue normal; teeth and gums normal  Neck:   Supple, symmetrical, trachea midline, no adenopathy;    thyroid:  no enlargement/tenderness/nodules; no carotid   bruit or JVD  Back:     Symmetric, no curvature, ROM normal, no CVA tenderness  Lungs:     Clear to auscultation bilaterally, respirations unlabored  Chest Wall:    No tenderness or deformity   Heart:    Regular rate and rhythm, S1 and S2 normal, no murmur, rub   or gallop     Abdomen:     Soft, non-tender, bowel sounds active all four quadrants,    no masses, no organomegaly        Extremities:   Extremities normal, atraumatic, no cyanosis or edema  Pulses:   2+ and symmetric all extremities  Skin:   Skin color, texture, turgor normal, no rashes or lesions  Lymph nodes:   Cervical, supraclavicular, and axillary nodes normal  Neurologic:   CNII-XII intact, normal strength, sensation and reflexes    throughout    LABORATORY DATA:  No results found for this or any previous visit (from the past 48 hour(s)).    RADIOGRAPHY: No results found.     PATHOLOGY: None  ASSESSMENT/PLAN: Ms. Blackston is a very pleasant 70 yo 74 American female with recent diagnosis of IDA.  Iron saturation is low at 10% and ferritin 287.  Erythropoietin level pending.  We will give 2 doses of IV iron and plan to see her again in another 6 weeks.  She does request that we call her husband's cell phone to schedule.   All  questions were answered and they are in agreement with the plan. The patient knows to call the clinic with any problems, questions or concerns. We can certainly see the patient sooner if needed.    [place], NP

## 2020-08-08 ENCOUNTER — Telehealth: Payer: Self-pay

## 2020-08-08 ENCOUNTER — Other Ambulatory Visit: Payer: Self-pay | Admitting: Family

## 2020-08-08 ENCOUNTER — Telehealth: Payer: Self-pay | Admitting: *Deleted

## 2020-08-08 LAB — IRON AND TIBC
Iron: 25 ug/dL — ABNORMAL LOW (ref 41–142)
Saturation Ratios: 10 % — ABNORMAL LOW (ref 21–57)
TIBC: 259 ug/dL (ref 236–444)
UIBC: 234 ug/dL (ref 120–384)

## 2020-08-08 LAB — ERYTHROPOIETIN: Erythropoietin: 20 m[IU]/mL — ABNORMAL HIGH (ref 2.6–18.5)

## 2020-08-08 LAB — FERRITIN: Ferritin: 287 ng/mL (ref 11–307)

## 2020-08-08 NOTE — Telephone Encounter (Signed)
No 4=19-22 los   [name] 

## 2020-08-08 NOTE — Telephone Encounter (Signed)
Per scheduling message 08/08/20 & 08/07/20 los - called patient and gave upcoming appointments.

## 2020-08-13 ENCOUNTER — Ambulatory Visit: Payer: Medicare Other

## 2020-08-14 ENCOUNTER — Inpatient Hospital Stay: Payer: Medicare Other

## 2020-08-14 ENCOUNTER — Other Ambulatory Visit: Payer: Self-pay

## 2020-08-14 VITALS — BP 112/57 | HR 70 | Temp 98.2°F | Resp 18

## 2020-08-14 DIAGNOSIS — Z800 Family history of malignant neoplasm of digestive organs: Secondary | ICD-10-CM | POA: Diagnosis not present

## 2020-08-14 DIAGNOSIS — D509 Iron deficiency anemia, unspecified: Secondary | ICD-10-CM

## 2020-08-14 DIAGNOSIS — Z8042 Family history of malignant neoplasm of prostate: Secondary | ICD-10-CM | POA: Diagnosis not present

## 2020-08-14 MED ORDER — SODIUM CHLORIDE 0.9 % IV SOLN
510.0000 mg | Freq: Once | INTRAVENOUS | Status: AC
  Administered 2020-08-14: 510 mg via INTRAVENOUS
  Filled 2020-08-14: qty 510

## 2020-08-14 MED ORDER — SODIUM CHLORIDE 0.9 % IV SOLN
Freq: Once | INTRAVENOUS | Status: AC
Start: 2020-08-14 — End: 2020-08-14
  Filled 2020-08-14: qty 250

## 2020-08-14 NOTE — Patient Instructions (Signed)

## 2020-08-20 ENCOUNTER — Ambulatory Visit: Payer: Medicare Other

## 2020-08-21 ENCOUNTER — Inpatient Hospital Stay: Payer: Medicare Other | Attending: Hematology & Oncology

## 2020-08-21 ENCOUNTER — Other Ambulatory Visit: Payer: Self-pay

## 2020-08-21 VITALS — BP 110/55 | HR 60 | Temp 98.0°F | Resp 16

## 2020-08-21 DIAGNOSIS — D509 Iron deficiency anemia, unspecified: Secondary | ICD-10-CM | POA: Insufficient documentation

## 2020-08-21 MED ORDER — SODIUM CHLORIDE 0.9 % IV SOLN
510.0000 mg | Freq: Once | INTRAVENOUS | Status: AC
  Administered 2020-08-21: 510 mg via INTRAVENOUS
  Filled 2020-08-21: qty 510

## 2020-08-21 MED ORDER — SODIUM CHLORIDE 0.9 % IV SOLN
Freq: Once | INTRAVENOUS | Status: AC
  Filled 2020-08-21: qty 250

## 2020-08-21 NOTE — Patient Instructions (Signed)
Ferumoxytol injection What is this medicine? FERUMOXYTOL is an iron complex. Iron is used to make healthy red blood cells, which carry oxygen and nutrients throughout the body. This medicine is used to treat iron deficiency anemia. This medicine may be used for other purposes; ask your health care provider or pharmacist if you have questions. COMMON BRAND NAME(S): Feraheme What should I tell my health care provider before I take this medicine? They need to know if you have any of these conditions:  anemia not caused by low iron levels  high levels of iron in the blood  magnetic resonance imaging (MRI) test scheduled  an unusual or allergic reaction to iron, other medicines, foods, dyes, or preservatives  pregnant or trying to get pregnant  breast-feeding How should I use this medicine? This medicine is for injection into a vein. It is given by a health care professional in a hospital or clinic setting. Talk to your pediatrician regarding the use of this medicine in children. Special care may be needed. Overdosage: If you think you have taken too much of this medicine contact a poison control center or emergency room at once. NOTE: This medicine is only for you. Do not share this medicine with others. What if I miss a dose? It is important not to miss your dose. Call your doctor or health care professional if you are unable to keep an appointment. What may interact with this medicine? This medicine may interact with the following medications:  other iron products This list may not describe all possible interactions. Give your health care provider a list of all the medicines, herbs, non-prescription drugs, or dietary supplements you use. Also tell them if you smoke, drink alcohol, or use illegal drugs. Some items may interact with your medicine. What should I watch for while using this medicine? Visit your doctor or healthcare professional regularly. Tell your doctor or healthcare  professional if your symptoms do not start to get better or if they get worse. You may need blood work done while you are taking this medicine. You may need to follow a special diet. Talk to your doctor. Foods that contain iron include: whole grains/cereals, dried fruits, beans, or peas, leafy green vegetables, and organ meats (liver, kidney). What side effects may I notice from receiving this medicine? Side effects that you should report to your doctor or health care professional as soon as possible:  allergic reactions like skin rash, itching or hives, swelling of the face, lips, or tongue  breathing problems  changes in blood pressure  feeling faint or lightheaded, falls  fever or chills  flushing, sweating, or hot feelings  swelling of the ankles or feet Side effects that usually do not require medical attention (report to your doctor or health care professional if they continue or are bothersome):  diarrhea  headache  nausea, vomiting  stomach pain This list may not describe all possible side effects. Call your doctor for medical advice about side effects. You may report side effects to FDA at 1-800-FDA-1088. Where should I keep my medicine? This drug is given in a hospital or clinic and will not be stored at home. NOTE: This sheet is a summary. It may not cover all possible information. If you have questions about this medicine, talk to your doctor, pharmacist, or health care provider.  2021 Elsevier/Gold Standard (2016-05-26 20:21:10)  

## 2020-09-03 DIAGNOSIS — Z23 Encounter for immunization: Secondary | ICD-10-CM | POA: Diagnosis not present

## 2020-09-18 ENCOUNTER — Telehealth: Payer: Self-pay | Admitting: *Deleted

## 2020-09-18 ENCOUNTER — Inpatient Hospital Stay: Payer: Medicare Other

## 2020-09-18 ENCOUNTER — Encounter: Payer: Self-pay | Admitting: Family

## 2020-09-18 ENCOUNTER — Inpatient Hospital Stay (HOSPITAL_BASED_OUTPATIENT_CLINIC_OR_DEPARTMENT_OTHER): Payer: Medicare Other | Admitting: Family

## 2020-09-18 VITALS — BP 119/66 | HR 65 | Temp 98.1°F | Resp 18 | Ht 62.0 in | Wt 158.1 lb

## 2020-09-18 DIAGNOSIS — D509 Iron deficiency anemia, unspecified: Secondary | ICD-10-CM | POA: Diagnosis not present

## 2020-09-18 LAB — CBC WITH DIFFERENTIAL (CANCER CENTER ONLY)
Abs Immature Granulocytes: 0.03 10*3/uL (ref 0.00–0.07)
Basophils Absolute: 0 10*3/uL (ref 0.0–0.1)
Basophils Relative: 0 %
Eosinophils Absolute: 0.1 10*3/uL (ref 0.0–0.5)
Eosinophils Relative: 1 %
HCT: 34.2 % — ABNORMAL LOW (ref 36.0–46.0)
Hemoglobin: 10.8 g/dL — ABNORMAL LOW (ref 12.0–15.0)
Immature Granulocytes: 0 %
Lymphocytes Relative: 17 %
Lymphs Abs: 1.8 10*3/uL (ref 0.7–4.0)
MCH: 27.3 pg (ref 26.0–34.0)
MCHC: 31.6 g/dL (ref 30.0–36.0)
MCV: 86.6 fL (ref 80.0–100.0)
Monocytes Absolute: 0.8 10*3/uL (ref 0.1–1.0)
Monocytes Relative: 8 %
Neutro Abs: 7.7 10*3/uL (ref 1.7–7.7)
Neutrophils Relative %: 74 %
Platelet Count: 531 10*3/uL — ABNORMAL HIGH (ref 150–400)
RBC: 3.95 MIL/uL (ref 3.87–5.11)
RDW: 18.1 % — ABNORMAL HIGH (ref 11.5–15.5)
WBC Count: 10.4 10*3/uL (ref 4.0–10.5)
nRBC: 0 % (ref 0.0–0.2)

## 2020-09-18 LAB — RETICULOCYTES
Immature Retic Fract: 10 % (ref 2.3–15.9)
RBC.: 3.91 MIL/uL (ref 3.87–5.11)
Retic Count, Absolute: 61.8 10*3/uL (ref 19.0–186.0)
Retic Ct Pct: 1.6 % (ref 0.4–3.1)

## 2020-09-18 NOTE — Progress Notes (Signed)
Hematology and Oncology Follow Up Visit  [name] [id number] Jul 22, 1950 70 y.o. 09/18/2020   Principle Diagnosis:  Iron deficiency anemia   Current Therapy:   IV iron as indicated    Interim History:  [name] is here today for follow-up. She is doing well and notes that she feels much better since receiving IV iron.  Her energy has improved and she is back to walking 1 mile per day.  She has not noted any blood loss. No bruising or petechiae.  She states that her methotrexate dose for RA has been reduced slightly since her last visit. She denies fever, chills, n/v, cough, rash, dizziness, SOB, chest pain, palpitations, abdominal pain or changes in bowel or bladder habits.  No swelling, tenderness, numbness or tingling in her extremities.  No falls or syncope.  She has maintained a good appetite and is staying well hydrated. Her weight is stable at 158 lbs.   ECOG Performance Status: 1 - Symptomatic but completely ambulatory  Medications:  Allergies as of 09/18/2020   No Known Allergies     Medication List       Accurate as of Sep 18, 2020  2:11 PM. If you have any questions, ask your nurse or doctor.        ALPRAZolam 0.5 MG tablet Commonly known as: XANAX TAKE ONE TABLET BY MOUTH TWICE A DAY AS NEEDED FOR ANXIETY   donepezil 10 MG tablet Commonly known as: Aricept Take 1 tablet (10 mg total) by mouth at bedtime.   Ferrex 150 150 MG capsule Generic drug: iron polysaccharides TAKE ONE CAPSULE BY MOUTH TWICE A DAY   levothyroxine 50 MCG tablet Commonly known as: SYNTHROID Take 1 tablet (50 mcg total) by mouth daily.   lisinopril 10 MG tablet Commonly known as: ZESTRIL TAKE ONE TABLET BY MOUTH DAILY   meloxicam 15 MG tablet Commonly known as: MOBIC Take 1 tablet (15 mg total) by mouth daily.   methotrexate 2.5 MG tablet Commonly known as: RHEUMATREX Take 2.5 mg by mouth once a week. Caution:Chemotherapy. Protect from light.   multivitamin with  minerals tablet Take 1 tablet by mouth daily.   Omega-3 1000 MG Caps Take by mouth daily.   sertraline 100 MG tablet Commonly known as: ZOLOFT Take 1 tablet (100 mg total) by mouth daily.   triamterene-hydrochlorothiazide 37.5-25 MG tablet Commonly known as: MAXZIDE-25 TAKE ONE TABLET BY MOUTH DAILY   vitamin B-12 500 MCG tablet Commonly known as: CYANOCOBALAMIN Take 500 mcg by mouth daily.   Vitamin D 1000 units capsule Take 1,000 Units by mouth daily.   vitamin E 180 MG (400 UNITS) capsule Take 400 Units by mouth daily.       Allergies: No Known Allergies  Past Medical History, Surgical history, Social history, and Family History were reviewed and updated.  Review of Systems: All other 10 point review of systems is negative.   Physical Exam:  height is 5\' 2"  (1.575 m) and weight is 158 lb 1.3 oz (71.7 kg). Her oral temperature is 98.1 F (36.7 C). Her blood pressure is 119/66 and her pulse is 65. Her respiration is 18 and oxygen saturation is 100%.   Wt Readings from Last 3 Encounters:  09/18/20 158 lb 1.3 oz (71.7 kg)  08/07/20 158 lb (71.7 kg)  07/02/20 152 lb (68.9 kg)    Ocular: Sclerae unicteric, pupils equal, round and reactive to light Ear-nose-throat: Oropharynx clear, dentition fair Lymphatic: No cervical or supraclavicular adenopathy Lungs no rales or  rhonchi, good excursion bilaterally Heart regular rate and rhythm, no murmur appreciated Abd soft, nontender, positive bowel sounds MSK no focal spinal tenderness, no joint edema Neuro: non-focal, well-oriented, appropriate affect Breasts: Deferred   Lab Results  Component Value Date   WBC 10.4 09/18/2020   HGB 10.8 (L) 09/18/2020   HCT 34.2 (L) 09/18/2020   MCV 86.6 09/18/2020   PLT 531 (H) 09/18/2020   Lab Results  Component Value Date   FERRITIN 287 08/07/2020   IRON 25 (L) 08/07/2020   TIBC 259 08/07/2020   UIBC 234 08/07/2020   IRONPCTSAT 10 (L) 08/07/2020   Lab Results  Component  Value Date   RETICCTPCT 1.6 09/18/2020   RBC 3.95 09/18/2020   RBC 3.91 09/18/2020   RETICCTABS 65,110 05/01/2020   No results found for: KPAFRELGTCHN, LAMBDASER, KAPLAMBRATIO No results found for: Loel Lofty, IGMSERUM Lab Results  Component Value Date   ALBUMINELP 3.7 (L) 08/25/2019   A1GS 0.4 (H) 08/25/2019   A2GS 0.8 08/25/2019   BETS 0.5 08/25/2019   BETA2SER 0.6 (H) 08/25/2019   GAMS 2.0 (H) 08/25/2019   SPEI  08/25/2019     Comment:     . One or more serum protein fractions are outside the normal ranges.  No abnormal protein bands are apparent. .      Chemistry      Component Value Date/Time   NA 135 08/07/2020 1256   K 4.1 08/07/2020 1256   CL 100 08/07/2020 1256   CO2 27 08/07/2020 1256   BUN 21 08/07/2020 1256   CREATININE 0.74 08/07/2020 1256   CREATININE 0.67 11/17/2019 1116      Component Value Date/Time   CALCIUM 10.4 (H) 08/07/2020 1256   ALKPHOS 113 08/07/2020 1256   AST 15 08/07/2020 1256   ALT 18 08/07/2020 1256   BILITOT 0.4 08/07/2020 1256       Impression and Plan: [name] is a very pleasant 69 yo African American female with recent diagnosis of IDA.  Iron studies are pending. We will replace if needed.  Follow-up in 2 months.  She can contact our office with any questions or concerns.   Emeline Gins, NP 5/31/20222:11 PM

## 2020-09-18 NOTE — Telephone Encounter (Signed)
Per 09/18/20 los - gave upcoming appointments - gave calendar

## 2020-09-19 LAB — IRON AND TIBC
Iron: 27 ug/dL — ABNORMAL LOW (ref 41–142)
Saturation Ratios: 13 % — ABNORMAL LOW (ref 21–57)
TIBC: 205 ug/dL — ABNORMAL LOW (ref 236–444)
UIBC: 178 ug/dL (ref 120–384)

## 2020-09-19 LAB — FERRITIN: Ferritin: 955 ng/mL — ABNORMAL HIGH (ref 11–307)

## 2020-09-20 ENCOUNTER — Telehealth: Payer: Self-pay | Admitting: Adult Health

## 2020-09-20 ENCOUNTER — Telehealth: Payer: Self-pay | Admitting: *Deleted

## 2020-09-20 NOTE — Telephone Encounter (Signed)
09/19/20 scheduling message from Maralyn Sago - called and gave upcoming appointment (1) dose of iron

## 2020-09-20 NOTE — Telephone Encounter (Signed)
LVM for pt to call back to r/s due to [name] being out

## 2020-09-24 ENCOUNTER — Ambulatory Visit: Payer: Medicare Other

## 2020-10-01 ENCOUNTER — Ambulatory Visit: Payer: Medicare Other | Admitting: Adult Health

## 2020-10-01 ENCOUNTER — Inpatient Hospital Stay: Payer: Medicare Other | Attending: Hematology & Oncology

## 2020-10-01 ENCOUNTER — Other Ambulatory Visit: Payer: Self-pay

## 2020-10-01 VITALS — BP 134/65 | HR 66 | Temp 98.8°F | Resp 18

## 2020-10-01 DIAGNOSIS — D509 Iron deficiency anemia, unspecified: Secondary | ICD-10-CM | POA: Diagnosis not present

## 2020-10-01 MED ORDER — SODIUM CHLORIDE 0.9 % IV SOLN
510.0000 mg | Freq: Once | INTRAVENOUS | Status: AC
  Administered 2020-10-01: 510 mg via INTRAVENOUS
  Filled 2020-10-01: qty 510

## 2020-10-01 MED ORDER — SODIUM CHLORIDE 0.9 % IV SOLN
Freq: Once | INTRAVENOUS | Status: AC
Start: 2020-10-01 — End: 2020-10-01
  Filled 2020-10-01: qty 250

## 2020-10-01 NOTE — Patient Instructions (Signed)
Ferumoxytol injection What is this medication? FERUMOXYTOL is an iron complex. Iron is used to make healthy red blood cells, which carry oxygen and nutrients throughout the body. This medicine is used totreat iron deficiency anemia. This medicine may be used for other purposes; ask your health care provider orpharmacist if you have questions. COMMON BRAND NAME(S): Feraheme What should I tell my care team before I take this medication? They need to know if you have any of these conditions: anemia not caused by low iron levels high levels of iron in the blood magnetic resonance imaging (MRI) test scheduled an unusual or allergic reaction to iron, other medicines, foods, dyes, or preservatives pregnant or trying to get pregnant breast-feeding How should I use this medication? This medicine is for injection into a vein. It is given by a health careprofessional in a hospital or clinic setting. Talk to your pediatrician regarding the use of this medicine in children.Special care may be needed. Overdosage: If you think you have taken too much of this medicine contact apoison control center or emergency room at once. NOTE: This medicine is only for you. Do not share this medicine with others. What if I miss a dose? It is important not to miss your dose. Call your doctor or health careprofessional if you are unable to keep an appointment. What may interact with this medication? This medicine may interact with the following medications: other iron products This list may not describe all possible interactions. Give your health care provider a list of all the medicines, herbs, non-prescription drugs, or dietary supplements you use. Also tell them if you smoke, drink alcohol, or use illegaldrugs. Some items may interact with your medicine. What should I watch for while using this medication? Visit your doctor or healthcare professional regularly. Tell your doctor or healthcare professional if your  symptoms do not start to get better or if theyget worse. You may need blood work done while you are taking this medicine. You may need to follow a special diet. Talk to your doctor. Foods that contain iron include: whole grains/cereals, dried fruits, beans, or peas, leafy greenvegetables, and organ meats (liver, kidney). What side effects may I notice from receiving this medication? Side effects that you should report to your doctor or health care professionalas soon as possible: allergic reactions like skin rash, itching or hives, swelling of the face, lips, or tongue breathing problems changes in blood pressure feeling faint or lightheaded, falls fever or chills flushing, sweating, or hot feelings swelling of the ankles or feet Side effects that usually do not require medical attention (report to yourdoctor or health care professional if they continue or are bothersome): diarrhea headache nausea, vomiting stomach pain This list may not describe all possible side effects. Call your doctor for medical advice about side effects. You may report side effects to FDA at1-800-FDA-1088. Where should I keep my medication? This drug is given in a hospital or clinic and will not be stored at home. NOTE: This sheet is a summary. It may not cover all possible information. If you have questions about this medicine, talk to your doctor, pharmacist, orhealth care provider.  2022 Elsevier/Gold Standard (2016-05-26 20:21:10)  

## 2020-10-04 ENCOUNTER — Other Ambulatory Visit: Payer: Self-pay | Admitting: *Deleted

## 2020-10-04 NOTE — Patient Outreach (Addendum)
[facility]) Care Management  [facility] Care Manager  10/04/2020   [name] 1950/05/08 [id number]  Subjective: Quarterly health check up.  Pt has one or two iron infusions since we talked in March. She cannot remember when the last one was. She is feeling better and going to regular checks up with oncology. She reports things are good at home and they are happy. She is able to stay at home alone so Mr. Yarbro can play golf. They cook together. He supervises meds and appointments.  Encounter Medications:  Outpatient Encounter Medications as of 10/04/2020  Medication Sig   Cholecalciferol (VITAMIN D) 1000 UNITS capsule Take 1,000 Units by mouth daily.   cyanocobalamin 500 MCG tablet Take 500 mcg by mouth daily.   donepezil (ARICEPT) 10 MG tablet Take 1 tablet (10 mg total) by mouth at bedtime.   FERREX 150 150 MG capsule TAKE ONE CAPSULE BY MOUTH TWICE A DAY   ferrous sulfate 325 (65 FE) MG tablet 1 tablet   folic acid (FOLVITE) 1 MG tablet Take 1 tablet by mouth daily.   levothyroxine (SYNTHROID) 50 MCG tablet Take 1 tablet (50 mcg total) by mouth daily.   lisinopril (ZESTRIL) 10 MG tablet TAKE ONE TABLET BY MOUTH DAILY   meloxicam (MOBIC) 15 MG tablet Take 1 tablet (15 mg total) by mouth daily.   methotrexate (RHEUMATREX) 2.5 MG tablet Take 2.5 mg by mouth once a week. Caution:Chemotherapy. Protect from light.   Multiple Vitamin (MULTI VITAMIN) TABS 1 tablet   Multiple Vitamins-Minerals (MULTIVITAMIN WITH MINERALS) tablet Take 1 tablet by mouth daily.   Omega-3 1000 MG CAPS Take by mouth daily.   sertraline (ZOLOFT) 100 MG tablet Take 1 tablet (100 mg total) by mouth daily.   triamterene-hydrochlorothiazide (MAXZIDE-25) 37.5-25 MG tablet TAKE ONE TABLET BY MOUTH DAILY   vitamin E 180 MG (400 UNITS) capsule Take 400 Units by mouth daily.   ALPRAZolam (XANAX) 0.5 MG tablet TAKE ONE TABLET BY MOUTH TWICE A DAY AS NEEDED FOR ANXIETY   No facility-administered encounter  medications on file as of 10/04/2020.    Functional Status:  In your present state of health, do you have any difficulty performing the following activities: 05/16/2020  Preparing Food and eating ? Y  Using the Toilet? N  In the past six months, have you accidently leaked urine? Y  Do you have problems with loss of bowel control? N  Managing your Medications? Y  Managing your Finances? Y  Housekeeping or managing your Housekeeping? Y  Some recent data might be hidden    Fall/Depression Screening: Fall Risk  05/16/2020 11/21/2019 09/14/2019  Falls in the past year? 0 0 0  Number falls in past yr: 0 0 0  Injury with Fall? 0 0 0  Risk for fall due to : Medication side effect;Mental status change - No Fall Risks  Follow up Falls evaluation completed Falls evaluation completed Falls evaluation completed   PHQ 2/9 Scores 05/16/2020 05/01/2020 05/01/2020 11/21/2019 11/15/2019 06/30/2019 01/11/2019  PHQ - 2 Score 1 0 0 1 0 0 0  PHQ- 9 Score - 0 - 1 0 - -    Assessment: Dementia stable - she could not remember she had an infusion on Monday.                         HTN well controlled.  Iron deficiency anemia.  Care Plan Care Plan : Wellness (Adult)  Updates made by Almetta Lovely, NP since 10/04/2020 12:00 AM      Goals Addressed             This Visit's Progress    Eat Healthy as evidenced by pt report of following the PLATE METHOD.       Timeframe:  Long-Range Goal Priority:  High Start Date:      10/04/20                       Expected End Date:   04/19/21                    Follow Up Date 01/18/2021    - set goal weight - drink 6 to 8 glasses of water each day - eat fish at least once per week - fill half of plate with vegetables - limit fast food meals to no more than 1 per week - manage portion size - reduce red meat to 2 to 3 times a week - switch to low-fat or skim milk    Why is this important?   When you are ready to manage your nutrition or  weight, having a plan and setting goals will help.  Taking small steps to change how you eat and exercise is a good place to start.    Notes: 10/04/20 [name] says they already follow a pretty good diet and I have just reinforced how important a good diet is. I have suggested then to keep the PLATE Method in mind when cooking at home or when eating out: 1/2 PLATE NON STARCHY VEGETABLES 1/4 PLATE PROTEIN, 1/4 PLATE CARBS. We discussed good hydration especially in the summer if going outdoors. Avoid heat of the day.      Exercise 3x per week (30 min per time) per report by pt at each quarterly check up.       Start date: 10/04/20 Long term goal Estimated completion time 04/19/21 Priority: High Barriers: Health Behaviors  10/04/20 Notes: Previously completed other goals, now focusing on general health interventions for good health. Pt enjoys walking with her husband in the morning for about a mile several times a week. Encouraged to ensure they are doing that (walking at least for 30 min, 3X a week). Keep in mind to be safety conscious about surroundings. Walk in the am or before dusk when it is not too hot.      COMPLETED: Planning for Long-Term Care-Dementia by the end of June 2022.       Timeframe:  Long-Range Goal Priority:  Medium Start Date:     05/16/20 , Renewed 07/03/20                       Expected End Date:   07/19/20 ,Renew 10/18/20                   Follow Up Date  10/18/20 - check out other places when staying at home is no longer possible (assisted living center, nursing home) - check out services like in-home help or adult day care - complete a Power of [place]; include caregiver - list the symptoms that would make staying at home too hard - make a list of future care and financial needs    Why is this important?   Learning that you or your loved one has dementia can be scary  and stressful.  You can reduce stress by planning.  Preparing for the future is one of the most  important things to do.  Thinking about how much care you/your loved one will need and how much it will cost is not easy.  Early on, you/your loved one can be part of making decisions for the future.  Making sure that your/your loved one's wishes for care are known is important.     Notes: 05/23/20 Introduce the idea of discussing the long term plan for [name] on our next call. 05/30/20 Discussed LTC plan with pt husband. They really don't have one at this time. His sister is a caregiver with an agency and she could provide some assistance but they are not there yet. Counseled on discussing with his wife and sister the scenario if [name] needs more oversight that just husband alone for a start. Is his sister willing to be her caregiver? Advised it is best to have the plan in mind before needing it. 07/03/20 Did not discuss today. We agreed to talk again in 3 months and will get thoughts and plans at that time. Sent message to Mr. Nordmeyer to advise of our next call date 10/04/20 and this topic to discuss. 10/04/20 Talked with [name] about any futher discussions about LTC planning. She says that everthing is fine at home. She is able to do all the things she wants to do at home. She is happy, feels safe. She takes her medications with Mr. Odonohue supervision. She is able to be at home for extended times alone. She has not required a sitter to date. She does say that she and her husband have scratched the surface of this discussion but that is all. I will close this goal.         Plan: We agreed to talk again in 3 months.           Called oncology to verify last infusion date and when next is scheduled for. Pt DID receive an infustion on Monday, June 13th and the next is scheuduled for July.  Follow-up: Patient agrees to Care Plan and Follow-up. Follow-up in 3 month(s).  [name]. Burgess Estelle, MSN, [facility] Gerontological Nurse Practitioner [facility] Care Management [phone]

## 2020-10-11 DIAGNOSIS — M2550 Pain in unspecified joint: Secondary | ICD-10-CM | POA: Diagnosis not present

## 2020-10-11 DIAGNOSIS — R5382 Chronic fatigue, unspecified: Secondary | ICD-10-CM | POA: Diagnosis not present

## 2020-10-11 DIAGNOSIS — E663 Overweight: Secondary | ICD-10-CM | POA: Diagnosis not present

## 2020-10-11 DIAGNOSIS — Z6829 Body mass index (BMI) 29.0-29.9, adult: Secondary | ICD-10-CM | POA: Diagnosis not present

## 2020-10-11 DIAGNOSIS — M0579 Rheumatoid arthritis with rheumatoid factor of multiple sites without organ or systems involvement: Secondary | ICD-10-CM | POA: Diagnosis not present

## 2020-10-11 DIAGNOSIS — M150 Primary generalized (osteo)arthritis: Secondary | ICD-10-CM | POA: Diagnosis not present

## 2020-10-26 DIAGNOSIS — M0579 Rheumatoid arthritis with rheumatoid factor of multiple sites without organ or systems involvement: Secondary | ICD-10-CM | POA: Diagnosis not present

## 2020-10-28 ENCOUNTER — Other Ambulatory Visit: Payer: Self-pay | Admitting: Internal Medicine

## 2020-11-09 ENCOUNTER — Other Ambulatory Visit: Payer: Self-pay | Admitting: *Deleted

## 2020-11-09 NOTE — Patient Outreach (Signed)
[facility]) Care Management  [facility] Care Manager  11/09/2020   [name] 08/18/1950 [id number]  Subjective: Telephone outreach to follow up on pt's general well-being.  Encounter Medications:  Outpatient Encounter Medications as of 11/09/2020  Medication Sig   ALPRAZolam (XANAX) 0.5 MG tablet TAKE ONE TABLET BY MOUTH TWICE A DAY AS NEEDED FOR ANXIETY   Cholecalciferol (VITAMIN D) 1000 UNITS capsule Take 1,000 Units by mouth daily.   cyanocobalamin 500 MCG tablet Take 500 mcg by mouth daily.   donepezil (ARICEPT) 10 MG tablet Take 1 tablet (10 mg total) by mouth at bedtime.   FERREX 150 150 MG capsule TAKE ONE CAPSULE BY MOUTH TWICE A DAY   ferrous sulfate 325 (65 FE) MG tablet 1 tablet   folic acid (FOLVITE) 1 MG tablet Take 1 tablet by mouth daily.   levothyroxine (SYNTHROID) 50 MCG tablet Take 1 tablet (50 mcg total) by mouth daily.   lisinopril (ZESTRIL) 10 MG tablet TAKE ONE TABLET BY MOUTH DAILY   meloxicam (MOBIC) 15 MG tablet Take 1 tablet (15 mg total) by mouth daily.   methotrexate (RHEUMATREX) 2.5 MG tablet Take 2.5 mg by mouth once a week. Caution:Chemotherapy. Protect from light.   Multiple Vitamin (MULTI VITAMIN) TABS 1 tablet   Multiple Vitamins-Minerals (MULTIVITAMIN WITH MINERALS) tablet Take 1 tablet by mouth daily.   Omega-3 1000 MG CAPS Take by mouth daily.   sertraline (ZOLOFT) 100 MG tablet Take 1 tablet (100 mg total) by mouth daily.   triamterene-hydrochlorothiazide (MAXZIDE-25) 37.5-25 MG tablet TAKE ONE TABLET BY MOUTH DAILY   vitamin E 180 MG (400 UNITS) capsule Take 400 Units by mouth daily.   No facility-administered encounter medications on file as of 11/09/2020.    Functional Status:  In your present state of health, do you have any difficulty performing the following activities: 05/16/2020  Preparing Food and eating ? Y  Using the Toilet? N  In the past six months, have you accidently leaked urine? Y  Do you have problems with  loss of bowel control? N  Managing your Medications? Y  Managing your Finances? Y  Housekeeping or managing your Housekeeping? Y  Some recent data might be hidden    Fall/Depression Screening: Fall Risk  05/16/2020 11/21/2019 09/14/2019  Falls in the past year? 0 0 0  Number falls in past yr: 0 0 0  Injury with Fall? 0 0 0  Risk for fall due to : Medication side effect;Mental status change - No Fall Risks  Follow up Falls evaluation completed Falls evaluation completed Falls evaluation completed   PHQ 2/9 Scores 05/16/2020 05/01/2020 05/01/2020 11/21/2019 11/15/2019 06/30/2019 01/11/2019  PHQ - 2 Score 1 0 0 1 0 0 0  PHQ- 9 Score - 0 - 1 0 - -    Assessment:  Dementia - mental status stable.                          Iron deficieincy anemia  Care Plan   Goals Addressed             This Visit's Progress    Eat Healthy as evidenced by pt report of following the PLATE METHOD.   On track    Timeframe:  Long-Range Goal Priority:  High Start Date:      10/04/20                       Expected End Date:  04/19/21                    Follow Up Date 01/18/2021    - set goal weight - drink 6 to 8 glasses of water each day - eat fish at least once per week - fill half of plate with vegetables - limit fast food meals to no more than 1 per week - manage portion size - reduce red meat to 2 to 3 times a week - switch to low-fat or skim milk    Why is this important?   When you are ready to manage your nutrition or weight, having a plan and setting goals will help.  Taking small steps to change how you eat and exercise is a good place to start.    Notes: 11/09/20 Mr. Cecilio Asper reports Mrs. Cecilio Asper is eating well. Her current wt is 158#. She is eating a balance of foods. She and her husband have increased their fluids during this hot weather  She continues to have regular blood counts and iron infusions when warranted. .10/04/20 Mrs. Hattery says they already follow a pretty good diet and I have just  reinforced how important a good diet is. I have suggested then to keep the PLATE Method in mind when cooking at home or when eating out: 1/2 PLATE NON STARCHY VEGETABLES 1/4 PLATE PROTEIN, 1/4 PLATE CARBS. We discussed good hydration especially in the summer if going outdoors. Avoid heat of the day.      Exercise 3x per week (30 min per time) per report by pt at each quarterly check up.   On track    Start date: 10/04/20 Long term goal Estimated completion time 04/19/21 Priority: High Barriers: Health Behaviors   Notes: 11/09/20 Mr. Sinha reports Mrs. Dungan is more active than ever. She even has to encourage him to get up and go with her. This is a daily practice now. Advised him to praise his wife for her increased activity and to keep it up.  10/04/20 Previously completed other goals, now focusing on general health interventions for good health. Pt enjoys walking with her husband in the morning for about a mile several times a week. Encouraged to ensure they are doing that (walking at least for 30 min, 3X a week). Keep in mind to be safety conscious about surroundings. Walk in the am or before dusk when it is not too hot.        Plan:  Follow-up: Patient agrees to Care Plan and Follow-up. Follow-up in 3 month(s)  Allis Quirarte C. Burgess Estelle, MSN, [facility] Gerontological Nurse Practitioner [facility] Care Management [phone]

## 2020-11-19 ENCOUNTER — Telehealth: Payer: Self-pay | Admitting: *Deleted

## 2020-11-19 ENCOUNTER — Other Ambulatory Visit: Payer: Self-pay

## 2020-11-19 ENCOUNTER — Inpatient Hospital Stay (HOSPITAL_BASED_OUTPATIENT_CLINIC_OR_DEPARTMENT_OTHER): Payer: Medicare Other | Admitting: Family

## 2020-11-19 ENCOUNTER — Inpatient Hospital Stay: Payer: Medicare Other | Attending: Hematology & Oncology

## 2020-11-19 ENCOUNTER — Encounter: Payer: Self-pay | Admitting: Family

## 2020-11-19 VITALS — BP 104/55 | HR 55 | Temp 98.4°F | Resp 18 | Ht 62.0 in | Wt 163.8 lb

## 2020-11-19 DIAGNOSIS — D509 Iron deficiency anemia, unspecified: Secondary | ICD-10-CM

## 2020-11-19 LAB — CBC WITH DIFFERENTIAL (CANCER CENTER ONLY)
Abs Immature Granulocytes: 0.08 10*3/uL — ABNORMAL HIGH (ref 0.00–0.07)
Basophils Absolute: 0.1 10*3/uL (ref 0.0–0.1)
Basophils Relative: 1 %
Eosinophils Absolute: 0.2 10*3/uL (ref 0.0–0.5)
Eosinophils Relative: 2 %
HCT: 36.5 % (ref 36.0–46.0)
Hemoglobin: 11.5 g/dL — ABNORMAL LOW (ref 12.0–15.0)
Immature Granulocytes: 1 %
Lymphocytes Relative: 30 %
Lymphs Abs: 2.8 10*3/uL (ref 0.7–4.0)
MCH: 29.1 pg (ref 26.0–34.0)
MCHC: 31.5 g/dL (ref 30.0–36.0)
MCV: 92.4 fL (ref 80.0–100.0)
Monocytes Absolute: 0.7 10*3/uL (ref 0.1–1.0)
Monocytes Relative: 8 %
Neutro Abs: 5.4 10*3/uL (ref 1.7–7.7)
Neutrophils Relative %: 58 %
Platelet Count: 356 10*3/uL (ref 150–400)
RBC: 3.95 MIL/uL (ref 3.87–5.11)
RDW: 15.8 % — ABNORMAL HIGH (ref 11.5–15.5)
WBC Count: 9.1 10*3/uL (ref 4.0–10.5)
nRBC: 0 % (ref 0.0–0.2)

## 2020-11-19 LAB — RETICULOCYTES
Immature Retic Fract: 6.7 % (ref 2.3–15.9)
RBC.: 3.99 MIL/uL (ref 3.87–5.11)
Retic Count, Absolute: 93 10*3/uL (ref 19.0–186.0)
Retic Ct Pct: 2.3 % (ref 0.4–3.1)

## 2020-11-19 LAB — FERRITIN: Ferritin: 1002 ng/mL — ABNORMAL HIGH (ref 11–307)

## 2020-11-19 LAB — IRON AND TIBC
Iron: 102 ug/dL (ref 41–142)
Saturation Ratios: 45 % (ref 21–57)
TIBC: 225 ug/dL — ABNORMAL LOW (ref 236–444)
UIBC: 123 ug/dL (ref 120–384)

## 2020-11-19 NOTE — Telephone Encounter (Signed)
Per 11/19/20 los - gave upcoming appointments - confirmed and printed calendar

## 2020-11-19 NOTE — Progress Notes (Signed)
Hematology and Oncology Follow Up Visit  [name] [id number] [birth year] 70 y.o. 11/19/2020   Principle Diagnosis:  Iron deficiency anemia    Current Therapy:        IV iron as indicated    Interim History:  [name] is here today for follow-up. She is doing well and has no complaints at this time.  No blood loss noted. No bruising or petechiae.  No fever, chills, n/v, cough, rash, dizziness, SOB, chest pain, palpitations, abdominal pain or changes in bowel or bladder habits. No swelling, tenderness, numbness or tingling in her extremities.  No falls or syncope.  She is eating well but smaller portions at times. She is doing her best to stay well hydrated. Her weight is stable at 163 lbs.   ECOG Performance Status: 1 - Symptomatic but completely ambulatory  Medications:  Allergies as of 11/19/2020   No Known Allergies      Medication List        Accurate as of November 19, 2020 10:24 AM. If you have any questions, ask your nurse or doctor.          ALPRAZolam 0.5 MG tablet Commonly known as: XANAX TAKE ONE TABLET BY MOUTH TWICE A DAY AS NEEDED FOR ANXIETY   donepezil 10 MG tablet Commonly known as: Aricept Take 1 tablet (10 mg total) by mouth at bedtime.   Ferrex 150 150 MG capsule Generic drug: iron polysaccharides TAKE ONE CAPSULE BY MOUTH TWICE A DAY   ferrous sulfate 325 (65 FE) MG tablet 1 tablet   folic acid 1 MG tablet Commonly known as: FOLVITE Take 1 tablet by mouth daily.   levothyroxine 50 MCG tablet Commonly known as: SYNTHROID Take 1 tablet (50 mcg total) by mouth daily.   lisinopril 10 MG tablet Commonly known as: ZESTRIL TAKE ONE TABLET BY MOUTH DAILY   meloxicam 15 MG tablet Commonly known as: MOBIC Take 1 tablet (15 mg total) by mouth daily.   methotrexate 2.5 MG tablet Commonly known as: RHEUMATREX Take 2.5 mg by mouth once a week. Caution:Chemotherapy. Protect from light.   Multi Vitamin Tabs 1 tablet   multivitamin with  minerals tablet Take 1 tablet by mouth daily.   Omega-3 1000 MG Caps Take by mouth daily.   sertraline 100 MG tablet Commonly known as: ZOLOFT Take 1 tablet (100 mg total) by mouth daily.   triamterene-hydrochlorothiazide 37.5-25 MG tablet Commonly known as: MAXZIDE-25 TAKE ONE TABLET BY MOUTH DAILY   vitamin B-12 500 MCG tablet Commonly known as: CYANOCOBALAMIN Take 500 mcg by mouth daily.   Vitamin D 1000 units capsule Take 1,000 Units by mouth daily.   vitamin E 180 MG (400 UNITS) capsule Take 400 Units by mouth daily.        Allergies: No Known Allergies  Past Medical History, Surgical history, Social history, and Family History were reviewed and updated.  Review of Systems: All other 10 point review of systems is negative.   Physical Exam:  vitals were not taken for this visit.   Wt Readings from Last 3 Encounters:  09/18/20 158 lb 1.3 oz (71.7 kg)  08/07/20 158 lb (71.7 kg)  07/02/20 152 lb (68.9 kg)    Ocular: Sclerae unicteric, pupils equal, round and reactive to light Ear-nose-throat: Oropharynx clear, dentition fair Lymphatic: No cervical or supraclavicular adenopathy Lungs no rales or rhonchi, good excursion bilaterally Heart regular rate and rhythm, no murmur appreciated Abd soft, nontender, positive bowel sounds MSK no focal spinal tenderness,  no joint edema Neuro: non-focal, well-oriented, appropriate affect Breasts: Deferred   Lab Results  Component Value Date   WBC 10.4 09/18/2020   HGB 10.8 (L) 09/18/2020   HCT 34.2 (L) 09/18/2020   MCV 86.6 09/18/2020   PLT 531 (H) 09/18/2020   Lab Results  Component Value Date   FERRITIN 955 (H) 09/18/2020   IRON 27 (L) 09/18/2020   TIBC 205 (L) 09/18/2020   UIBC 178 09/18/2020   IRONPCTSAT 13 (L) 09/18/2020   Lab Results  Component Value Date   RETICCTPCT 1.6 09/18/2020   RBC 3.95 09/18/2020   RBC 3.91 09/18/2020   RETICCTABS 65,110 05/01/2020   No results found for: KPAFRELGTCHN,  LAMBDASER, KAPLAMBRATIO No results found for: Loel Lofty, IGMSERUM Lab Results  Component Value Date   ALBUMINELP 3.7 (L) 08/25/2019   A1GS 0.4 (H) 08/25/2019   A2GS 0.8 08/25/2019   BETS 0.5 08/25/2019   BETA2SER 0.6 (H) 08/25/2019   GAMS 2.0 (H) 08/25/2019   SPEI  08/25/2019     Comment:     . One or more serum protein fractions are outside the normal ranges.  No abnormal protein bands are apparent. .      Chemistry      Component Value Date/Time   NA 135 08/07/2020 1256   K 4.1 08/07/2020 1256   CL 100 08/07/2020 1256   CO2 27 08/07/2020 1256   BUN 21 08/07/2020 1256   CREATININE 0.74 08/07/2020 1256   CREATININE 0.67 11/17/2019 1116      Component Value Date/Time   CALCIUM 10.4 (H) 08/07/2020 1256   ALKPHOS 113 08/07/2020 1256   AST 15 08/07/2020 1256   ALT 18 08/07/2020 1256   BILITOT 0.4 08/07/2020 1256       Impression and Plan: [name] is a very pleasant 70 yo African American female with iron deficiency anemia.   Iron studies are pending. We will replace if needed.  Follow-up in 3 months.   She can contact our office with any questions or concerns.   Emeline Gins, NP 8/1/202210:24 AM

## 2020-11-20 ENCOUNTER — Other Ambulatory Visit: Payer: Self-pay

## 2020-11-20 ENCOUNTER — Other Ambulatory Visit: Payer: Medicare Other | Admitting: Internal Medicine

## 2020-11-20 DIAGNOSIS — E039 Hypothyroidism, unspecified: Secondary | ICD-10-CM

## 2020-11-20 DIAGNOSIS — Z8659 Personal history of other mental and behavioral disorders: Secondary | ICD-10-CM

## 2020-11-20 DIAGNOSIS — E611 Iron deficiency: Secondary | ICD-10-CM | POA: Diagnosis not present

## 2020-11-20 DIAGNOSIS — R7302 Impaired glucose tolerance (oral): Secondary | ICD-10-CM | POA: Diagnosis not present

## 2020-11-20 DIAGNOSIS — Z0000 Encounter for general adult medical examination without abnormal findings: Secondary | ICD-10-CM

## 2020-11-20 DIAGNOSIS — I10 Essential (primary) hypertension: Secondary | ICD-10-CM

## 2020-11-20 DIAGNOSIS — E7800 Pure hypercholesterolemia, unspecified: Secondary | ICD-10-CM

## 2020-11-20 DIAGNOSIS — G4733 Obstructive sleep apnea (adult) (pediatric): Secondary | ICD-10-CM

## 2020-11-20 DIAGNOSIS — R413 Other amnesia: Secondary | ICD-10-CM | POA: Diagnosis not present

## 2020-11-20 DIAGNOSIS — D649 Anemia, unspecified: Secondary | ICD-10-CM | POA: Diagnosis not present

## 2020-11-20 DIAGNOSIS — D509 Iron deficiency anemia, unspecified: Secondary | ICD-10-CM | POA: Diagnosis not present

## 2020-11-20 DIAGNOSIS — R011 Cardiac murmur, unspecified: Secondary | ICD-10-CM | POA: Diagnosis not present

## 2020-11-21 LAB — CBC WITH DIFFERENTIAL/PLATELET
Absolute Monocytes: 735 cells/uL (ref 200–950)
Basophils Absolute: 47 cells/uL (ref 0–200)
Basophils Relative: 0.5 %
Eosinophils Absolute: 102 cells/uL (ref 15–500)
Eosinophils Relative: 1.1 %
HCT: 37.4 % (ref 35.0–45.0)
Hemoglobin: 12.1 g/dL (ref 11.7–15.5)
Lymphs Abs: 2325 cells/uL (ref 850–3900)
MCH: 28.7 pg (ref 27.0–33.0)
MCHC: 32.4 g/dL (ref 32.0–36.0)
MCV: 88.8 fL (ref 80.0–100.0)
MPV: 10.5 fL (ref 7.5–12.5)
Monocytes Relative: 7.9 %
Neutro Abs: 6092 cells/uL (ref 1500–7800)
Neutrophils Relative %: 65.5 %
Platelets: 369 10*3/uL (ref 140–400)
RBC: 4.21 10*6/uL (ref 3.80–5.10)
RDW: 14.5 % (ref 11.0–15.0)
Total Lymphocyte: 25 %
WBC: 9.3 10*3/uL (ref 3.8–10.8)

## 2020-11-21 LAB — COMPLETE METABOLIC PANEL WITH GFR
AG Ratio: 0.9 (calc) — ABNORMAL LOW (ref 1.0–2.5)
ALT: 12 U/L (ref 6–29)
AST: 17 U/L (ref 10–35)
Albumin: 3.7 g/dL (ref 3.6–5.1)
Alkaline phosphatase (APISO): 102 U/L (ref 37–153)
BUN: 15 mg/dL (ref 7–25)
CO2: 26 mmol/L (ref 20–32)
Calcium: 9.7 mg/dL (ref 8.6–10.4)
Chloride: 103 mmol/L (ref 98–110)
Creat: 0.71 mg/dL (ref 0.50–1.05)
Globulin: 4.3 g/dL (calc) — ABNORMAL HIGH (ref 1.9–3.7)
Glucose, Bld: 86 mg/dL (ref 65–99)
Potassium: 4.1 mmol/L (ref 3.5–5.3)
Sodium: 138 mmol/L (ref 135–146)
Total Bilirubin: 0.5 mg/dL (ref 0.2–1.2)
Total Protein: 8 g/dL (ref 6.1–8.1)
eGFR: 92 mL/min/{1.73_m2} (ref >=60)

## 2020-11-21 LAB — LIPID PANEL
Cholesterol: 267 mg/dL — ABNORMAL HIGH (ref <200)
HDL: 69 mg/dL (ref >=50)
LDL Cholesterol (Calc): 181 mg/dL (calc) — ABNORMAL HIGH
Non-HDL Cholesterol (Calc): 198 mg/dL (calc) — ABNORMAL HIGH (ref <130)
Total CHOL/HDL Ratio: 3.9 (calc) (ref <5.0)
Triglycerides: 68 mg/dL (ref <150)

## 2020-11-21 LAB — HEMOGLOBIN A1C
Hgb A1c MFr Bld: 5.1 % of total Hgb (ref <5.7)
Mean Plasma Glucose: 100 mg/dL
eAG (mmol/L): 5.5 mmol/L

## 2020-11-21 LAB — TSH: TSH: 2.87 mIU/L (ref 0.40–4.50)

## 2020-11-23 ENCOUNTER — Ambulatory Visit (INDEPENDENT_AMBULATORY_CARE_PROVIDER_SITE_OTHER): Payer: Medicare Other | Admitting: Internal Medicine

## 2020-11-23 ENCOUNTER — Encounter: Payer: Self-pay | Admitting: Internal Medicine

## 2020-11-23 ENCOUNTER — Other Ambulatory Visit: Payer: Self-pay

## 2020-11-23 VITALS — BP 110/80 | HR 57 | Ht 62.0 in | Wt 165.0 lb

## 2020-11-23 DIAGNOSIS — D509 Iron deficiency anemia, unspecified: Secondary | ICD-10-CM | POA: Diagnosis not present

## 2020-11-23 DIAGNOSIS — R7302 Impaired glucose tolerance (oral): Secondary | ICD-10-CM | POA: Diagnosis not present

## 2020-11-23 DIAGNOSIS — Z0000 Encounter for general adult medical examination without abnormal findings: Secondary | ICD-10-CM

## 2020-11-23 DIAGNOSIS — E039 Hypothyroidism, unspecified: Secondary | ICD-10-CM

## 2020-11-23 DIAGNOSIS — M0579 Rheumatoid arthritis with rheumatoid factor of multiple sites without organ or systems involvement: Secondary | ICD-10-CM | POA: Diagnosis not present

## 2020-11-23 DIAGNOSIS — E7800 Pure hypercholesterolemia, unspecified: Secondary | ICD-10-CM | POA: Diagnosis not present

## 2020-11-23 DIAGNOSIS — R413 Other amnesia: Secondary | ICD-10-CM

## 2020-11-23 DIAGNOSIS — Z8659 Personal history of other mental and behavioral disorders: Secondary | ICD-10-CM | POA: Diagnosis not present

## 2020-11-23 DIAGNOSIS — I10 Essential (primary) hypertension: Secondary | ICD-10-CM | POA: Diagnosis not present

## 2020-11-23 DIAGNOSIS — Z6830 Body mass index (BMI) 30.0-30.9, adult: Secondary | ICD-10-CM

## 2020-11-23 DIAGNOSIS — R011 Cardiac murmur, unspecified: Secondary | ICD-10-CM

## 2020-11-23 LAB — POCT URINALYSIS DIPSTICK
Appearance: NEGATIVE
Bilirubin, UA: NEGATIVE
Blood, UA: NEGATIVE
Glucose, UA: NEGATIVE
Ketones, UA: NEGATIVE
Leukocytes, UA: NEGATIVE
Nitrite, UA: NEGATIVE
Odor: NEGATIVE
Protein, UA: NEGATIVE
Spec Grav, UA: 1.01 (ref 1.010–1.025)
Urobilinogen, UA: 0.2 E.U./dL
pH, UA: 6 (ref 5.0–8.0)

## 2020-11-23 MED ORDER — LEVOTHYROXINE SODIUM 50 MCG PO TABS: 50.0000 ug | ORAL_TABLET | Freq: Every day | ORAL | 3 refills | Status: DC

## 2020-11-23 MED ORDER — ROSUVASTATIN CALCIUM 20 MG PO TABS: 20.0000 mg | ORAL_TABLET | Freq: Every day | ORAL | 3 refills | Status: DC

## 2020-11-23 NOTE — Progress Notes (Signed)
Subjective:    Patient ID: [name], female    DOB: 1950-05-01, 70 y.o.   MRN: [id number]  HPI 70 year old Female seen for Medicare wellness and health maintenance exam.  She is accompanied by her husband, [place].  He reports that methotrexate has not agreed with overall that well.  She takes it by mouth.  Says her joints are doing well but it has made her a bit out of sorts he believes.  She walks a mile a day or so.  Memory loss is still an issue and she remains on Aricept.  Is followed by Neurology.  She is pleasant and cooperative.  She sleeps well.  Appetite reported is fine.  History of depression treated with Zoloft.  History of hypertension treated with Maxide.  Hypothyroidism treated with levothyroxine.  Musculoskeletal pain treated with Mobic.  Anxiety treated with Xanax.  History of moderate obstructive sleep apnea.  Was initially diagnosed with mild cognitive impairment by Dr. Leonides Cave, neuropsychologist in December 2016.  She was started on Zoloft at that time for depression.  She has had previous MRI study of the brain with and without contrast showing no acute findings or abnormal lesions.  Mild periventricular and subcortical foci of nonspecific gliosis.  History of bilateral TKAs in February and December 2018.  These were done by Dr.  History of impaired glucose tolerance, hypertension and hyperlipidemia.  Remote history of iron deficiency anemia related to menorrhagia that resolved with menopause.  Last colonoscopy was 2011.  Dr. [place] Callas did allergy testing in 2007 which was negative.  She saw ENT physician at the time for chronic sinusitis and was thought to have GE reflux.  CT of the sinuses was normal.  Is seen at [facility] rheumatology having hand joint pain/arthralgias for some time.  Has positive ANA and CCP.  ANA titer was 1: 320 and pattern is nuclear/speckled.  CCP was greater than 250.  Rheumatoid factor IX 64.  She had an elevated white count at the time of  diagnosis 11,600 and hemoglobin 10.1 g.  Currently not anemic.  Hemoglobin 12.1 g and white blood cell count 9300.  Is on methotrexate 2.5 mg once a week.  This seems to work well for musculoskeletal issues.  I think Crestor has been discontinued by her husband but I would like to restart it at 20 mg daily.  I do not think it is contributing to musculoskeletal issues and I think she needs it to prevent atherosclerosis.  For total cholesterol Crestor is 267 and LDL was 181.  HDL was 69 and triglycerides 68.  She will start back on this and follow-up in February.  Ganglion cyst removed from left hand 1999.  Bilateral tubal ligation 1993.  Thrombosed hemorrhoid excised by Dr. Derrell Lolling in 2005.  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  She has 3 adult children 2 daughters and a son.  Husband is retired from [employer] where he worked as an Art gallery manager.  He is very supportive of her.  I talked with him separately today.  He does try to get out and get some exercise and occasionally needs with some friends to get out of the house.  He notes she does not wander.  She does get confused.  Children live out of town.    Review of Systems see above     Objective:   Physical Exam Blood pressure excellent at 110/80 pulse 57 regular pulse oximetry 97% weight 165 pounds.  BMI 30.18.  Weight here year ago was 171 pounds with a BMI of 31.28.  Skin: Warm and dry.  No cervical adenopathy.  No carotid bruits.  Chest is clear to auscultation.  Breasts are without masses.  Cardiac exam regular rate and rhythm.  Abdomen is soft nondistended without hepatosplenomegaly masses or tenderness.  No lower extremity pitting edema.  She is alert but disoriented to year, month as well as day of week. cranial nerves II through XII are grossly intact    Assessment & Plan:  Essential hypertension-stable on current regimen  Hyperlipidemia-she is off statin at present time. Restart this and f/u in 6  months.  Rheumatoid arthritis-continue current treatment by rheumatology  Memory loss continue follow-up with Neurology  Hypothyroidism continue thyroid replacement medication at same   History of bilateral TKAs  Impaired glucose tolerance-on diet alone  Weight is stable  Plan: Plan to follow-up in 6 months and check lipids.  Continue neurology and rheumatology follow-up.  Subjective:   Patient presents for Medicare Annual/Subsequent preventive examination.  Review Past Medical/Family/Social:   Risk Factors  Current exercise habits:  Dietary issues discussed:   Cardiac risk factors:  Depression Screen  (Note: if answer to either of the following is "Yes", a more complete depression screening is indicated)   Over the past two weeks, have you felt down, depressed or hopeless? No  Over the past two weeks, have you felt little interest or pleasure in doing things? No Have you lost interest or pleasure in daily life? No Do you often feel hopeless? No Do you cry easily over simple problems? yes  Activities of Daily Living  In your present state of health, do you have any difficulty performing the following activities?:   Driving? No  Managing money? No  Feeding yourself? No  Getting from bed to chair? No  Climbing a flight of stairs? No  Preparing food and eating?: No  Bathing or showering? No  Getting dressed: No  Getting to the toilet? No  Using the toilet:No  Moving around from place to place: No  In the past year have you fallen or had a near fall?:No  Are you sexually active? yes Do you have more than one partner? No   Hearing Difficulties: No  Do you often ask people to speak up or repeat themselves? No  Do you experience ringing or noises in your ears? No  Do you have difficulty understanding soft or whispered voices? No  Do you feel that you have a problem with memory? Yes sometimes Do you often misplace items? No    Home Safety:  Do you have a smoke  alarm at your residence? Yes Do you have grab bars in the bathroom?yes Do you have throw rugs in your house? yes   Cognitive Testing  Alert? Yes Normal Appearance?Yes  Oriented to person? Yes Place? Yes  Time? No Recall of three objects? No Can perform simple calculations? No Displays appropriate judgment?Yes  Can read the correct time from a watch face?Yes   List the Names of Other Physician/Practitioners you currently use:  See referral list for the physicians patient is currently seeing.  Neurologist Rheumatologist   Review of Systems: see above   Objective:     General appearance: Appears stated age and mildly obese  Head: Normocephalic, without obvious abnormality, atraumatic  Eyes: conj clear, EOMi PEERLA  Ears: normal TM's and external ear canals both ears  Nose: Nares normal. Septum midline. Mucosa normal. No drainage or  sinus tenderness.  Throat: lips, mucosa, and tongue normal; teeth and gums normal  Neck: no adenopathy, no carotid bruit, no JVD, supple, symmetrical, trachea midline and thyroid not enlarged, symmetric, no tenderness/mass/nodules  No CVA tenderness.  Lungs: clear to auscultation bilaterally  Breasts: normal appearance, no masses or tenderness Heart: regular rate and rhythm, S1, S2 normal, no murmur, click, rub or gallop  Abdomen: soft, non-tender; bowel sounds normal; no masses, no organomegaly  Musculoskeletal: ROM normal in all joints, no crepitus, no deformity, Normal muscle strengthen. Back  is symmetric, no curvature. Skin: Skin color, texture, turgor normal. No rashes or lesions  Lymph nodes: Cervical, supraclavicular, and axillary nodes normal.  Neurologic: CN 2 -12 Normal, Normal symmetric reflexes. Normal coordination and gait  Psych: Alert & Oriented x 3, Mood appear stable.    Assessment:    Annual wellness medicare exam   Plan:    During the course of the visit the patient was educated and counseled about appropriate screening  and preventive services including:    See above    Patient Instructions (the written plan) was given to the patient.  Medicare Attestation  I have personally reviewed:  The patient's medical and social history  Their use of alcohol, tobacco or illicit drugs  Their current medications and supplements  The patient's functional ability including ADLs,fall risks, home safety risks, cognitive, and hearing and visual impairment  Diet and physical activities  Evidence for depression or mood disorders  The patient's weight, height, BMI, and visual acuity have been recorded in the chart. I have made referrals, counseling, and provided education to the patient based on review of the above and I have provided the patient with a written personalized care plan for preventive services.

## 2020-11-23 NOTE — Patient Instructions (Signed)
Start Crestor generic (Rosuvastsatin) 20 mg daily for pure hypercholesterolemia. Have gone over current meds with [name]. RTC in 6 months. Glad she is exercising at the [facility]. She seems about the same with regard to memory issues.

## 2020-12-17 NOTE — Progress Notes (Deleted)
PATIENT: [name] DOB: August 18, 1950  REASON FOR VISIT: follow up HISTORY FROM: patient PRIMARY NEUROLOGIST:   HISTORY OF PRESENT ILLNESS: Today 12/17/20  HISTORY 08/22/2019: I reviewed her CPAP compliance data for the past 87 days, she used her CPAP only 30 days, percent use days greater than 4 hours was 20%. In the first month after starting CPAP in the beginning of February through beginning of March, her compliance for more than 4 hours was a little better at 29%. Residual AHI is at goal, an average of 1.2/h in the past nearly 90 days. Leak has improved significantly since the beginning of March, since she is switched to a different mask per husband's report.    REVIEW OF SYSTEMS: Out of a complete 14 system review of symptoms, the patient complains only of the following symptoms, and all other reviewed systems are negative.  FSS ESS  ALLERGIES: No Known Allergies  HOME MEDICATIONS: Outpatient Medications Prior to Visit  Medication Sig Dispense Refill   ALPRAZolam (XANAX) 0.5 MG tablet TAKE ONE TABLET BY MOUTH TWICE A DAY AS NEEDED FOR ANXIETY 60 tablet 1   Cholecalciferol (VITAMIN D) 1000 UNITS capsule Take 1,000 Units by mouth daily.     cyanocobalamin 500 MCG tablet Take 500 mcg by mouth daily.     donepezil (ARICEPT) 10 MG tablet Take 1 tablet (10 mg total) by mouth at bedtime. (Patient not taking: Reported on 11/23/2020) 90 tablet 1   ferrous sulfate 325 (65 FE) MG tablet 1 tablet     folic acid (FOLVITE) 1 MG tablet Take 1 tablet by mouth daily.     levothyroxine (SYNTHROID) 50 MCG tablet Take 1 tablet (50 mcg total) by mouth daily. 90 tablet 3   lisinopril (ZESTRIL) 10 MG tablet TAKE ONE TABLET BY MOUTH DAILY 90 tablet 1   meloxicam (MOBIC) 15 MG tablet Take 1 tablet (15 mg total) by mouth daily. 30 tablet 0   methotrexate (RHEUMATREX) 2.5 MG tablet Take 2.5 mg by mouth once a week. Caution:Chemotherapy. Protect from light.     Multiple Vitamin (MULTI VITAMIN) TABS 1  tablet     Multiple Vitamins-Minerals (MULTIVITAMIN WITH MINERALS) tablet Take 1 tablet by mouth daily.     Omega-3 1000 MG CAPS Take by mouth daily.     rosuvastatin (CRESTOR) 20 MG tablet Take 1 tablet (20 mg total) by mouth daily. 90 tablet 3   sertraline (ZOLOFT) 100 MG tablet Take 1 tablet (100 mg total) by mouth daily. 90 tablet 2   triamterene-hydrochlorothiazide (MAXZIDE-25) 37.5-25 MG tablet TAKE ONE TABLET BY MOUTH DAILY 90 tablet 1   vitamin E 180 MG (400 UNITS) capsule Take 400 Units by mouth daily.     No facility-administered medications prior to visit.    PAST MEDICAL HISTORY: Past Medical History:  Diagnosis Date   Anemia    Hyperlipidemia    Hypertension    Pre-diabetes    Sleep apnea     PAST SURGICAL HISTORY: Past Surgical History:  Procedure Laterality Date   GANGLION CYST EXCISION     l hand   JOINT REPLACEMENT     knee right   TUBAL LIGATION      FAMILY HISTORY: Family History  Problem Relation Age of Onset   Diabetes Mother    Hypertension Mother    Kidney disease Father    Heart disease Father    Stroke Sister     SOCIAL HISTORY: Social History   Socioeconomic History   Marital  status: Married    Spouse name: Not on file   Number of children: Not on file   Years of education: Not on file   Highest education level: Not on file  Occupational History   Not on file  Tobacco Use   Smoking status: Never   Smokeless tobacco: Never  Vaping Use   Vaping Use: Never used  Substance and Sexual Activity   Alcohol use: Yes    Comment: rarely   Drug use: No   Sexual activity: Not on file  Other Topics Concern   Not on file  Social History Narrative   Not on file   Social Determinants of Health   Financial Resource Strain: Not on file  Food Insecurity: No Food Insecurity   Worried About Running Out of Food in the Last Year: Never true   Ran Out of Food in the Last Year: Never true  Transportation Needs: No Transportation Needs   Lack  of Transportation (Medical): No   Lack of Transportation (Non-Medical): No  Physical Activity: Not on file  Stress: Not on file  Social Connections: Not on file  Intimate Partner Violence: Not on file      PHYSICAL EXAM  There were no vitals filed for this visit. There is no height or weight on file to calculate BMI.  Generalized: Well developed, in no acute distress  Chest: Lungs clear to auscultation bilaterally  Neurological examination  Mentation: Alert oriented to time, place, history taking. Follows all commands speech and language fluent Cranial nerve II-XII: Extraocular movements were full, visual field were full on confrontational test Head turning and shoulder shrug  were normal and symmetric. Motor: The motor testing reveals 5 over 5 strength of all 4 extremities. Good symmetric motor tone is noted throughout.  Sensory: Sensory testing is intact to soft touch on all 4 extremities. No evidence of extinction is noted.  Gait and station: Gait is normal.    DIAGNOSTIC DATA (LABS, IMAGING, TESTING) - I reviewed patient records, labs, notes, testing and imaging myself where available.  Lab Results  Component Value Date   WBC 9.3 11/20/2020   HGB 12.1 11/20/2020   HCT 37.4 11/20/2020   MCV 88.8 11/20/2020   PLT 369 11/20/2020      Component Value Date/Time   NA 138 11/20/2020 0920   K 4.1 11/20/2020 0920   CL 103 11/20/2020 0920   CO2 26 11/20/2020 0920   GLUCOSE 86 11/20/2020 0920   BUN 15 11/20/2020 0920   CREATININE 0.71 11/20/2020 0920   CALCIUM 9.7 11/20/2020 0920   PROT 8.0 11/20/2020 0920   ALBUMIN 3.9 08/07/2020 1256   AST 17 11/20/2020 0920   AST 15 08/07/2020 1256   ALT 12 11/20/2020 0920   ALT 18 08/07/2020 1256   ALKPHOS 113 08/07/2020 1256   BILITOT 0.5 11/20/2020 0920   BILITOT 0.4 08/07/2020 1256   GFRNONAA >60 08/07/2020 1256   GFRNONAA 90 11/17/2019 1116   GFRAA 105 11/17/2019 1116   Lab Results  Component Value Date   CHOL 267 (H)  11/20/2020   HDL 69 11/20/2020   LDLCALC 181 (H) 11/20/2020   TRIG 68 11/20/2020   CHOLHDL 3.9 11/20/2020   Lab Results  Component Value Date   HGBA1C 5.1 11/20/2020   Lab Results  Component Value Date   VITAMINB12 1,155 (H) 11/17/2019   Lab Results  Component Value Date   TSH 2.87 11/20/2020      ASSESSMENT AND PLAN 70 y.o. year old  female  has a past medical history of Anemia, Hyperlipidemia, Hypertension, Pre-diabetes, and Sleep apnea. here with:  OSA on CPAP  - CPAP compliance excellent - Good treatment of AHI  - Encourage patient to use CPAP nightly and > 4 hours each night - F/U in 1 year or sooner if needed   I spent *** minutes of face-to-face and non-face-to-face time with patient.  This included previsit chart review, lab review, study review, order entry, electronic health record documentation, patient education.  Butch Penny, MSN, NP-C 12/17/2020, 7:05 PM [facility] [address], [address] [place], [place] [address] [phone]

## 2020-12-18 ENCOUNTER — Encounter: Payer: Medicare Other | Admitting: Adult Health

## 2020-12-18 ENCOUNTER — Encounter: Payer: Self-pay | Admitting: Adult Health

## 2020-12-18 NOTE — Progress Notes (Signed)
This encounter was created in error - please disregard.

## 2021-01-15 DIAGNOSIS — M0579 Rheumatoid arthritis with rheumatoid factor of multiple sites without organ or systems involvement: Secondary | ICD-10-CM | POA: Diagnosis not present

## 2021-01-15 DIAGNOSIS — M2550 Pain in unspecified joint: Secondary | ICD-10-CM | POA: Diagnosis not present

## 2021-01-15 DIAGNOSIS — M150 Primary generalized (osteo)arthritis: Secondary | ICD-10-CM | POA: Diagnosis not present

## 2021-01-15 DIAGNOSIS — E669 Obesity, unspecified: Secondary | ICD-10-CM | POA: Diagnosis not present

## 2021-01-15 DIAGNOSIS — Z6830 Body mass index (BMI) 30.0-30.9, adult: Secondary | ICD-10-CM | POA: Diagnosis not present

## 2021-01-15 DIAGNOSIS — R5382 Chronic fatigue, unspecified: Secondary | ICD-10-CM | POA: Diagnosis not present

## 2021-01-18 DIAGNOSIS — M0579 Rheumatoid arthritis with rheumatoid factor of multiple sites without organ or systems involvement: Secondary | ICD-10-CM | POA: Diagnosis not present

## 2021-01-18 DIAGNOSIS — Z79899 Other long term (current) drug therapy: Secondary | ICD-10-CM | POA: Diagnosis not present

## 2021-01-22 ENCOUNTER — Ambulatory Visit: Payer: Medicare Other | Admitting: Adult Health

## 2021-02-08 ENCOUNTER — Other Ambulatory Visit: Payer: Self-pay | Admitting: *Deleted

## 2021-02-08 NOTE — Patient Outreach (Signed)
[facility]) Care Management  [facility] Care Manager  02/08/2021   CITLALLY CAPTAIN 1951-02-25 [id number]  Subjective: Returned call from Mrs. Keelin.  Encounter Medications:  Outpatient Encounter Medications as of 02/08/2021  Medication Sig   ALPRAZolam (XANAX) 0.5 MG tablet TAKE ONE TABLET BY MOUTH TWICE A DAY AS NEEDED FOR ANXIETY   Cholecalciferol (VITAMIN D) 1000 UNITS capsule Take 1,000 Units by mouth daily.   cyanocobalamin 500 MCG tablet Take 500 mcg by mouth daily.   donepezil (ARICEPT) 10 MG tablet Take 1 tablet (10 mg total) by mouth at bedtime.   ferrous sulfate 325 (65 FE) MG tablet 1 tablet   folic acid (FOLVITE) 1 MG tablet Take 1 tablet by mouth daily.   levothyroxine (SYNTHROID) 50 MCG tablet Take 1 tablet (50 mcg total) by mouth daily.   lisinopril (ZESTRIL) 10 MG tablet TAKE ONE TABLET BY MOUTH DAILY   meloxicam (MOBIC) 15 MG tablet Take 1 tablet (15 mg total) by mouth daily.   methotrexate (RHEUMATREX) 2.5 MG tablet Take 2.5 mg by mouth once a week. Caution:Chemotherapy. Protect from light.   Multiple Vitamin (MULTI VITAMIN) TABS 1 tablet   Multiple Vitamins-Minerals (MULTIVITAMIN WITH MINERALS) tablet Take 1 tablet by mouth daily.   Omega-3 1000 MG CAPS Take by mouth daily.   rosuvastatin (CRESTOR) 20 MG tablet Take 1 tablet (20 mg total) by mouth daily.   sertraline (ZOLOFT) 100 MG tablet Take 1 tablet (100 mg total) by mouth daily.   triamterene-hydrochlorothiazide (MAXZIDE-25) 37.5-25 MG tablet TAKE ONE TABLET BY MOUTH DAILY   vitamin E 180 MG (400 UNITS) capsule Take 400 Units by mouth daily.   No facility-administered encounter medications on file as of 02/08/2021.    Functional Status:  In your present state of health, do you have any difficulty performing the following activities: 11/23/2020 05/16/2020  Hearing? N -  Vision? N -  Difficulty concentrating or making decisions? N -  Walking or climbing stairs? N -  Dressing or bathing? N -   Doing errands, shopping? N -  Preparing Food and eating ? - Y  Using the Toilet? - N  In the past six months, have you accidently leaked urine? - Y  Do you have problems with loss of bowel control? - N  Managing your Medications? - Y  Managing your Finances? - Y  Housekeeping or managing your Housekeeping? - Y  Some recent data might be hidden    Fall/Depression Screening: Fall Risk  05/16/2020 11/21/2019 09/14/2019  Falls in the past year? 0 0 0  Number falls in past yr: 0 0 0  Injury with Fall? 0 0 0  Risk for fall due to : Medication side effect;Mental status change - No Fall Risks  Follow up Falls evaluation completed Falls evaluation completed Falls evaluation completed   PHQ 2/9 Scores 11/23/2020 05/16/2020 05/01/2020 05/01/2020 11/21/2019 11/15/2019 06/30/2019  PHQ - 2 Score 1 1 0 0 1 0 0  PHQ- 9 Score 1 - 0 - 1 0 -    Assessment: Stable Anemia  Care Plan   Goals Addressed             This Visit's Progress    COMPLETED: Eat Healthy as evidenced by pt report of following the PLATE METHOD.       Timeframe:  Long-Range Goal Priority:  High Start Date:      10/04/20  Expected End Date:   04/19/21                    Follow Up Date 01/18/2021    - set goal weight - drink 6 to 8 glasses of water each day - eat fish at least once per week - fill half of plate with vegetables - limit fast food meals to no more than 1 per week - manage portion size - reduce red meat to 2 to 3 times a week - switch to low-fat or skim milk    Why is this important?   When you are ready to manage your nutrition or weight, having a plan and setting goals will help.  Taking small steps to change how you eat and exercise is a good place to start.    Notes: 02/08/21 Mrs. Kittle says that she has made a few changes in her dietary habits. She is trying to eat on a schdule and she is following the plate method we previously discussed. She has had a wt gain, currently 166#. She had  her annual wellness visit for the year. Her labs in August were good. Her CBC was normal and her Iron and TIBC are in normal range now. Encouraged her newly adopted eating habits. 11/09/20 Mr. Cecilio Asper reports Mrs. Cecilio Asper is eating well. Her current wt is 158#. She is eating a balance of foods. She and her husband have increased their fluids during this hot weather  She continues to have regular blood counts and iron infusions when warranted. .10/04/20 Mrs. Keil says they already follow a pretty good diet and I have just reinforced how important a good diet is. I have suggested then to keep the PLATE Method in mind when cooking at home or when eating out: 1/2 PLATE NON STARCHY VEGETABLES 1/4 PLATE PROTEIN, 1/4 PLATE CARBS. We discussed good hydration especially in the summer if going outdoors. Avoid heat of the day.      Exercise 3x per week (30 min per time) per report by pt at each quarterly check up.   On track    Start date: 10/04/20 Long term goal Estimated completion time 05/21/21 Follow up in Jan 2023 Priority: High Barriers: Health Behaviors   Notes: 02/08/21 Mr. And Mrs. Stadel are walking a mile 3 times a week. [facility] are [facility] and doing some group exercise classes. Encouraged her to keep up the good work. 11/09/20 Mr. Malinoski reports Mrs. Torosyan is more active than ever. She even has to encourage him to get up and go with her. This is a daily practice now. Advised him to praise his wife for her increased activity and to keep it up.  10/04/20 Previously completed other goals, now focusing on general health interventions for good health. Pt enjoys walking with her husband in the morning for about a mile several times a week. Encouraged to ensure they are doing that (walking at least for 30 min, 3X a week). Keep in mind to be safety conscious about surroundings. Walk in the am or before dusk when it is not too hot.        Plan:  Follow-up: Patient agrees to Care Plan and  Follow-up. Follow-up in 3 month(s)  Hendryx Ricke C. Burgess Estelle, MSN, [facility] Gerontological Nurse Practitioner [facility] Care Management [phone]

## 2021-02-08 NOTE — Patient Outreach (Signed)
[facility]) Care Management  02/08/2021  [name] 01-28-1951 [id number]  Quarterly telephone outreach to follow up on her HTN and anemia.  No answer but was able to leave a message and requested a return call.  Will call pt on Monday if no call back.  [name]. Burgess Estelle, MSN, [facility] Gerontological Nurse Practitioner [facility] Care Management [phone]

## 2021-02-11 ENCOUNTER — Ambulatory Visit: Payer: Medicare Other | Admitting: *Deleted

## 2021-02-19 ENCOUNTER — Inpatient Hospital Stay: Payer: Medicare Other | Admitting: Family

## 2021-02-19 ENCOUNTER — Inpatient Hospital Stay: Payer: Medicare Other | Attending: Hematology & Oncology

## 2021-03-02 ENCOUNTER — Other Ambulatory Visit: Payer: Self-pay

## 2021-03-02 ENCOUNTER — Ambulatory Visit
Admission: RE | Admit: 2021-03-02 | Discharge: 2021-03-02 | Disposition: A | Discharge status: Home / Self Care | Payer: Medicare Other | Source: Ambulatory Visit | Attending: Internal Medicine | Admitting: Internal Medicine

## 2021-03-02 DIAGNOSIS — Z1231 Encounter for screening mammogram for malignant neoplasm of breast: Secondary | ICD-10-CM | POA: Diagnosis not present

## 2021-03-05 ENCOUNTER — Other Ambulatory Visit: Payer: Self-pay | Admitting: Family Medicine

## 2021-03-05 ENCOUNTER — Other Ambulatory Visit: Payer: Self-pay | Admitting: Internal Medicine

## 2021-03-05 DIAGNOSIS — R928 Other abnormal and inconclusive findings on diagnostic imaging of breast: Secondary | ICD-10-CM

## 2021-03-19 DIAGNOSIS — M0579 Rheumatoid arthritis with rheumatoid factor of multiple sites without organ or systems involvement: Secondary | ICD-10-CM | POA: Diagnosis not present

## 2021-03-22 ENCOUNTER — Other Ambulatory Visit: Payer: Self-pay | Admitting: Internal Medicine

## 2021-04-05 ENCOUNTER — Ambulatory Visit
Admission: RE | Admit: 2021-04-05 | Discharge: 2021-04-05 | Disposition: A | Discharge status: Home / Self Care | Payer: Medicare Other | Source: Ambulatory Visit | Attending: Internal Medicine | Admitting: Internal Medicine

## 2021-04-05 ENCOUNTER — Ambulatory Visit: Payer: Medicare Other

## 2021-04-05 DIAGNOSIS — R922 Inconclusive mammogram: Secondary | ICD-10-CM | POA: Diagnosis not present

## 2021-04-05 DIAGNOSIS — R928 Other abnormal and inconclusive findings on diagnostic imaging of breast: Secondary | ICD-10-CM

## 2021-04-24 DIAGNOSIS — M150 Primary generalized (osteo)arthritis: Secondary | ICD-10-CM | POA: Diagnosis not present

## 2021-04-24 DIAGNOSIS — M0579 Rheumatoid arthritis with rheumatoid factor of multiple sites without organ or systems involvement: Secondary | ICD-10-CM | POA: Diagnosis not present

## 2021-04-24 DIAGNOSIS — M2550 Pain in unspecified joint: Secondary | ICD-10-CM | POA: Diagnosis not present

## 2021-04-24 DIAGNOSIS — R5382 Chronic fatigue, unspecified: Secondary | ICD-10-CM | POA: Diagnosis not present

## 2021-05-08 ENCOUNTER — Encounter: Payer: Self-pay | Admitting: Family

## 2021-05-13 ENCOUNTER — Encounter: Payer: Self-pay | Admitting: Family

## 2021-05-14 ENCOUNTER — Ambulatory Visit: Payer: Medicare Other | Admitting: *Deleted

## 2021-05-14 DIAGNOSIS — M0579 Rheumatoid arthritis with rheumatoid factor of multiple sites without organ or systems involvement: Secondary | ICD-10-CM | POA: Diagnosis not present

## 2021-05-15 ENCOUNTER — Other Ambulatory Visit: Payer: Self-pay | Admitting: *Deleted

## 2021-05-15 NOTE — Patient Outreach (Signed)
Glynn Yepes C. Brianna Hiller, MSN, GNP-BC ?Gerontological Nurse Practitioner ?[facility] Care Management ?[phone] ? ?

## 2021-05-17 ENCOUNTER — Other Ambulatory Visit: Payer: Self-pay | Admitting: *Deleted

## 2021-05-17 NOTE — Patient Outreach (Addendum)
[name] [facility]) Care Management  05/17/2021  ONEISHA OZIER 1951-03-28 [id number]  Telephone outreach for quarterly [facility] check up.  Pt called NP back and we discussed her general feeling of well-being which she said she is doing well. On MD appts her BP is normal. She is exercising regularly. Her weight is stable. She is oriented and her conversation is appropriate.  Note she has several care gaps: Colonoscopy is due, DEXA, Flu vaccine. Never had Shingles Vaccine.  She needs her COVID Bivalent vaccine. Will send reminder to Dr. Renold Genta. Pt to see her in February. Pt did have her mammogram in Dec 2022.  Pts. HgbA1C in Aug was 5.1, Cholesterol HIGH 267, LDL HIGH 181, Non-HDL HIGH 198. HDL and Triglycerides in range.  Care Plan : [place] Management Plan of Care  Updates made by Deloria Lair, NP since 05/17/2021 12:00 AM     Problem: Care Gaps   Priority: High  Onset Date: 05/17/2021     Long-Range Goal: Patient will complete her care gaps within the next 6 months.   Start Date: 05/17/2021  Expected End Date: 11/18/2021  Priority: High  Note:   Current Barriers:  Knowledge Deficits related to plan of care for management of Annual Health Maintenance   RNCM Clinical Goal(s):  Patient will attend all scheduled medical appointments: to close care gaps as evidenced by chart review and discussion with pt.        through collaboration with RN Care manager, provider, and care team.   Interventions: Inter-disciplinary care team collaboration (see longitudinal plan of care) Evaluation of current treatment plan related to  self management and patient's adherence to plan as established by provider               Pt is due for colonoscopy, DEXA, flu vaccine, never had Shingles vaccine                And needs her COVID Omicron Bivalent vaccine.  Patient Goals/Self-Care Activities: Attend all scheduled provider appointments to close her care gaps.      Pt agrees to plan of  care and an appt in April  Jadence Kinlaw C. Myrtie Neither, MSN, [facility] Gerontological Nurse Practitioner [facility] Care Management [phone]

## 2021-05-17 NOTE — Patient Instructions (Addendum)
°  Visit Information  Thank you for taking time to visit with me today. Please don't hesitate to contact me if I can be of assistance to you before our next scheduled telephone appointment.  Following are the goals we discussed today:  (Copy and paste patient goals from clinical care plan here)  Our next appointment is by telephone on April 3rd.  Following is a copy of your care plan:  Care Plan : [facility] Care Management Plan of Care  Updates made by Almetta Lovely, NP since 05/17/2021 12:00 AM     Problem: Care Gaps   Priority: High  Onset Date: 05/17/2021     Long-Range Goal: Patient will complete her care gaps within the next 6 months.   Start Date: 05/17/2021  Expected End Date: 11/18/2021  Priority: High  Note:   Current Barriers:  Knowledge Deficits related to plan of care for management of Annual Health Maintenance   RNCM Clinical Goal(s):  Patient will attend all scheduled medical appointments: to close care gaps as evidenced by chart review and discussion with pt.        through collaboration with RN Care manager, provider, and care team.   Interventions: Inter-disciplinary care team collaboration (see longitudinal plan of care) Evaluation of current treatment plan related to  self management and patient's adherence to plan as established by provider               Pt is due for colonoscopy, DEXA, flu vaccine, never had Shingles vaccine                And needs her COVID Omicron Bivalent vaccine.  Patient Goals/Self-Care Activities: Attend all scheduled provider appointments to close her care gaps.       The patient verbalized understanding of instructions, educational materials, and care plan provided today and agreed to receive a mailed copy of patient instructions, educational materials, and care plan.   Telephone follow up appointment with care management team member scheduled for: 07/22/21  Noralyn Pick C. Burgess Estelle, MSN, [facility] Gerontological Nurse Practitioner [facility] Care  Management [phone]

## 2021-05-17 NOTE — Patient Outreach (Signed)
[facility]) Care Management  05/17/2021  LEILANA ROTERT 07-01-1950 [id number]   Second telephone outreach without success, left a message and requested a return call.  Eulah Pont. Myrtie Neither, MSN, [facility] Gerontological Nurse Practitioner [facility] Care Management [phone]

## 2021-05-17 NOTE — Patient Outreach (Addendum)
[facility]) Care Management  05/15/21  [name] Feb 17, 1951 [id number]  Telephone outreach, unsuccessful, left message and requested a return call.  [name]. Burgess Estelle, MSN, [facility] Gerontological Nurse Practitioner [name] Care Management [phone]

## 2021-05-20 ENCOUNTER — Ambulatory Visit: Payer: Medicare Other | Admitting: *Deleted

## 2021-05-27 ENCOUNTER — Other Ambulatory Visit: Payer: Self-pay

## 2021-05-27 ENCOUNTER — Other Ambulatory Visit: Payer: Medicare Other | Admitting: Internal Medicine

## 2021-05-27 DIAGNOSIS — E039 Hypothyroidism, unspecified: Secondary | ICD-10-CM | POA: Diagnosis not present

## 2021-05-27 DIAGNOSIS — R7302 Impaired glucose tolerance (oral): Secondary | ICD-10-CM | POA: Diagnosis not present

## 2021-05-27 DIAGNOSIS — E785 Hyperlipidemia, unspecified: Secondary | ICD-10-CM

## 2021-05-28 ENCOUNTER — Ambulatory Visit (INDEPENDENT_AMBULATORY_CARE_PROVIDER_SITE_OTHER): Payer: Medicare Other | Admitting: Internal Medicine

## 2021-05-28 ENCOUNTER — Encounter: Payer: Self-pay | Admitting: Internal Medicine

## 2021-05-28 VITALS — BP 128/80 | HR 54 | Temp 98.4°F | Ht 62.0 in | Wt 167.8 lb

## 2021-05-28 DIAGNOSIS — I10 Essential (primary) hypertension: Secondary | ICD-10-CM

## 2021-05-28 DIAGNOSIS — R413 Other amnesia: Secondary | ICD-10-CM

## 2021-05-28 DIAGNOSIS — E7800 Pure hypercholesterolemia, unspecified: Secondary | ICD-10-CM

## 2021-05-28 DIAGNOSIS — G4733 Obstructive sleep apnea (adult) (pediatric): Secondary | ICD-10-CM

## 2021-05-28 DIAGNOSIS — Z8659 Personal history of other mental and behavioral disorders: Secondary | ICD-10-CM

## 2021-05-28 DIAGNOSIS — E039 Hypothyroidism, unspecified: Secondary | ICD-10-CM

## 2021-05-28 DIAGNOSIS — R7302 Impaired glucose tolerance (oral): Secondary | ICD-10-CM

## 2021-05-28 LAB — HEMOGLOBIN A1C
Hgb A1c MFr Bld: 5.2 % of total Hgb (ref <5.7)
Mean Plasma Glucose: 103 mg/dL
eAG (mmol/L): 5.7 mmol/L

## 2021-05-28 LAB — LIPID PANEL
Cholesterol: 271 mg/dL — ABNORMAL HIGH (ref <200)
HDL: 56 mg/dL (ref >=50)
LDL Cholesterol (Calc): 197 mg/dL (calc) — ABNORMAL HIGH
Non-HDL Cholesterol (Calc): 215 mg/dL (calc) — ABNORMAL HIGH (ref <130)
Total CHOL/HDL Ratio: 4.8 (calc) (ref <5.0)
Triglycerides: 76 mg/dL (ref <150)

## 2021-05-28 LAB — HEPATIC FUNCTION PANEL
AG Ratio: 0.8 (calc) — ABNORMAL LOW (ref 1.0–2.5)
ALT: 14 U/L (ref 6–29)
AST: 18 U/L (ref 10–35)
Albumin: 3.7 g/dL (ref 3.6–5.1)
Alkaline phosphatase (APISO): 84 U/L (ref 37–153)
Bilirubin, Direct: 0.1 mg/dL (ref 0.0–0.2)
Globulin: 4.4 g/dL (calc) — ABNORMAL HIGH (ref 1.9–3.7)
Indirect Bilirubin: 0.4 mg/dL (calc) (ref 0.2–1.2)
Total Bilirubin: 0.5 mg/dL (ref 0.2–1.2)
Total Protein: 8.1 g/dL (ref 6.1–8.1)

## 2021-05-28 LAB — TSH: TSH: 3.16 mIU/L (ref 0.40–4.50)

## 2021-05-28 NOTE — Progress Notes (Signed)
° °  Subjective:    Patient ID: [name], female    DOB: 16-Apr-1951, 71 y.o.   MRN: [id number]  HPI 71 year old Female seen for 6 month recheck. Hx memory loss, HTN, hyperlipidemia, hypothyroidism, depression, anxiety and impaired glucose tolerance.  History of moderate obstructive sleep apnea.  Is not interested in CPAP.  Was initially diagnosed with mild cognitive impairment by Dr. Leonides Cave, neuropsychologist in December 2016 she was started on Zoloft at that time for depression.  She had previous MRI of the brain with and without contrast showing no acute findings or abnormal lesions.  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  Has been seen at [facility] rheumatology for joint pain and arthralgias.  Has positive ANA and CCP.  ANA titer was 1: 320 and pattern was nuclear/speckled.  CCP was greater than 250.  Rheumatoid factor was positive.  She is on methotrexate 2.5 mg once a week and this seems to work well for her.  Had allergy testing in 2007 by Dr. Crowder Callas which was negative.  She saw ENT physician at that time for chronic sinusitis but was thought to have GE reflux.  CT of the sinuses was normal.  Remote history of iron deficiency anemia related to menorrhagia but that resolved with menopause.  I have spoken with her husband about restarting her Crestor.  Also had this discussion in August 2022.  Her total cholesterol is 271 and LDL cholesterol 197.  Triglycerides are normal at 76 and HDL cholesterol 56.  Ganglion cyst removed from left hand 1999.  Bilateral tubal ligation 1993.  Thrombosed hemorrhoid excised by Dr. Derrell Lolling in 2005.  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  Husband is retired from lower lowered.  She has 3 adult children, 2 daughters and a son.  Husband is very supportive.  She has no behavioral issues.  Sleeps well.  Visits with neighbors.  Does not wander.  She  does get confused at times.    Review of Systems see above     Objective:   Physical Exam   BP 128/80:,Pulse 54 regular:,T 98.4       Assessment & Plan:  With regard to glucose intolerance her hemoglobin A1c is 5.2%.  This is controlled with diet.  Hypothyroidism treated with levothyroxine and stable  Hypertension treated with Maxide 25 and lisinopril 10 mg daily and stable.  History of depression treated with Zoloft  History of anxiety treated with Xanax  Memory loss treated with Aricept  Pure hypercholesterolemia not taking statin and is uncontrolled  Rheumatoid arthritis - has been seen at [facility].Treated with Methotrexate.  Plan: Husband promises to get her started back on statin medication.  Return in 6 months.  Continue other medications as previously prescribed.

## 2021-06-16 NOTE — Patient Instructions (Signed)
Please continue current medications as prescribed.  Please restart lipid-lowering medication rosuvastatin.  Return in 6 months for health maintenance exam and Medicare wellness visit.

## 2021-06-29 ENCOUNTER — Other Ambulatory Visit: Payer: Self-pay | Admitting: Internal Medicine

## 2021-07-09 DIAGNOSIS — M0579 Rheumatoid arthritis with rheumatoid factor of multiple sites without organ or systems involvement: Secondary | ICD-10-CM | POA: Diagnosis not present

## 2021-07-22 ENCOUNTER — Other Ambulatory Visit: Payer: Self-pay | Admitting: *Deleted

## 2021-07-22 NOTE — Patient Outreach (Signed)
[facility]) Care Management ?Geriatric Nurse Practitioner Note ? ? ?07/22/2021 ?Name:  [name] MRN:  [id number] DOB:  07-27-1950 ? ?Summary: ?Pt is less motivated. Has CARE GAPS: Coloscopy, DEXA, flu vac, Shingles vac, Omicron Vac. ? ?Recommendations/Changes made from today's visit: ?Continue encouraging Mrs. Serano to be active and follow MD instructions. ? ?Subjective: ?SHANTALE HOLTMEYER is an 71 y.o. year old female who is a primary patient of Baxley, Luanna Cole, MD. The care management team was consulted for assistance with care management and/or care coordination needs.   ? ?Geriatric Nurse Practitioner completed Telephone Visit today.  ? ?Objective: ? ?Medications Reviewed Today   ? ? Reviewed by Jama Flavors, CMA (Certified Medical Assistant) on 05/28/21 at 1131  Med List Status: <None>  ? ?Medication Order Taking? Sig Documenting Provider Last Dose Status Informant  ?ALPRAZolam (XANAX) 0.5 MG tablet [id number] Yes TAKE ONE TABLET BY MOUTH TWICE A DAY AS NEEDED FOR ANXIETY Baxley, Luanna Cole, MD Taking Active   ?Cholecalciferol (VITAMIN D) 1000 UNITS capsule [id number] Yes Take 1,000 Units by mouth daily. [provider] Taking Active   ?cyanocobalamin 500 MCG tablet [id number] Yes Take 500 mcg by mouth daily. [provider] Taking Active   ?donepezil (ARICEPT) 10 MG tablet [id number] Yes Take 1 tablet (10 mg total) by mouth at bedtime. Margaree Mackintosh, MD Taking Active   ?ferrous sulfate 325 (65 FE) MG tablet [id number] Yes 1 tablet [provider] Taking Active   ?folic acid (FOLVITE) 1 MG tablet [id number] Yes Take 1 tablet by mouth daily. [provider] Taking Active   ?levothyroxine (SYNTHROID) 50 MCG tablet [id number] Yes Take 1 tablet (50 mcg total) by mouth daily. Margaree Mackintosh, MD Taking Active   ?lisinopril (ZESTRIL) 10 MG tablet [id number] Yes TAKE ONE TABLET BY MOUTH DAILY Baxley, Luanna Cole, MD Taking Active   ?meloxicam (MOBIC) 15 MG tablet [id number] Yes  Take 1 tablet (15 mg total) by mouth daily. Margaree Mackintosh, MD Taking Active   ?methotrexate (RHEUMATREX) 2.5 MG tablet [id number] Yes Take 2.5 mg by mouth once a week. Caution:Chemotherapy. Protect from light. [provider] Taking Active   ?Multiple Vitamin (MULTI VITAMIN) TABS [id number] Yes 1 tablet [provider] Taking Active   ?Multiple Vitamins-Minerals (MULTIVITAMIN WITH MINERALS) tablet [id number] Yes Take 1 tablet by mouth daily. [provider] Taking Active   ?Omega-3 1000 MG CAPS [id number] Yes Take by mouth daily. [provider] Taking Active   ?rosuvastatin (CRESTOR) 20 MG tablet [id number] Yes Take 1 tablet (20 mg total) by mouth daily. Margaree Mackintosh, MD Taking Active   ?sertraline (ZOLOFT) 100 MG tablet [id number] Yes Take 1 tablet (100 mg total) by mouth daily. Margaree Mackintosh, MD Taking Active   ?triamterene-hydrochlorothiazide [facility]) 37.5-25 MG tablet [id number] Yes TAKE ONE TABLET BY MOUTH DAILY Baxley, Luanna Cole, MD Taking Active   ?vitamin E 180 MG (400 UNITS) capsule [id number] Yes Take 400 Units by mouth daily. [provider] Taking Active   ? ?  ?  ? ?  ? ? ? ?SDOH:  (Social Determinants of Health) assessments and interventions performed:  ? ? ?Care Plan ? ?Review of patient past medical history, allergies, medications, health status, including review of consultants reports, laboratory and other test data, was performed as part of comprehensive evaluation for care management services.  ? ?Care Plan : [facility] Care Management Plan of Care  ?Updates made by Almetta Lovely, NP since 07/22/2021 12:00 AM  ?  ? ?  Problem: Care Gaps   ?Priority: High  ?Onset Date: 05/17/2021  ?  ? ?Long-Range Goal: Patient will complete her care gaps within the next 6 months.   ?Start Date: 05/17/2021  ?Expected End Date: 11/18/2021  ?This Visit's Progress: Not on track  ?Priority: High  ?Note:   ? ?Update:  (Status: Goal on track: NO.) Long Term Goal  ?Evaluation of current  treatment plan related to Closing CARE GAPS and patient's adherence to plan as established by provider ?      Spoke to Mr. Ambriz who reports his wife is not a motivated as was her usual. She does not want to walk. It takes a lot of prodding to to get her going. He acknowledges that this goal is not on track. Advised him of the items that are needed and will advise Dr. Lenord Fellers to assess for closure of these on pt's appt in 6 months. ? ? ?Current Barriers:  ?Knowledge Deficits related to plan of care for management of Annual Health Maintenance  ? ?RNCM Clinical Goal(s):  ?Patient will attend all scheduled medical appointments: to close care gaps as evidenced by chart review and discussion with pt.        through collaboration with RN Care manager, provider, and care team.  ? ?Interventions: ?Inter-disciplinary care team collaboration (see longitudinal plan of care) ?Evaluation of current treatment plan related to  self management and patient's adherence to plan as established by provider ?              Pt is due for colonoscopy, DEXA, flu vaccine, never had Shingles vaccine  ?              And needs her COVID Omicron Bivalent vaccine. ? ?Patient Goals/Self-Care Activities: ?Attend all scheduled provider appointments to close her care gaps. ? ? ?  ?  ? ?Plan: Telephone follow up appointment with care management team member scheduled for:  6 months,  October 3rd. Pt and husband agree to the plan of care. ? ?[name]. Jaceon Heiberger, MSN, GNP-BC ?Gerontological Nurse Practitioner ?[facility] Care Management ?[phone] ? ? ? ? ? ?

## 2021-09-03 DIAGNOSIS — M0579 Rheumatoid arthritis with rheumatoid factor of multiple sites without organ or systems involvement: Secondary | ICD-10-CM | POA: Diagnosis not present

## 2021-09-05 DIAGNOSIS — H524 Presbyopia: Secondary | ICD-10-CM | POA: Diagnosis not present

## 2021-10-05 ENCOUNTER — Other Ambulatory Visit: Payer: Self-pay | Admitting: Internal Medicine

## 2021-10-11 ENCOUNTER — Other Ambulatory Visit: Payer: Self-pay | Admitting: *Deleted

## 2021-10-29 DIAGNOSIS — M2550 Pain in unspecified joint: Secondary | ICD-10-CM | POA: Diagnosis not present

## 2021-10-29 DIAGNOSIS — M0579 Rheumatoid arthritis with rheumatoid factor of multiple sites without organ or systems involvement: Secondary | ICD-10-CM | POA: Diagnosis not present

## 2021-10-29 DIAGNOSIS — R5382 Chronic fatigue, unspecified: Secondary | ICD-10-CM | POA: Diagnosis not present

## 2021-10-29 DIAGNOSIS — M1991 Primary osteoarthritis, unspecified site: Secondary | ICD-10-CM | POA: Diagnosis not present

## 2021-10-30 DIAGNOSIS — M0579 Rheumatoid arthritis with rheumatoid factor of multiple sites without organ or systems involvement: Secondary | ICD-10-CM | POA: Diagnosis not present

## 2021-10-30 DIAGNOSIS — R5383 Other fatigue: Secondary | ICD-10-CM | POA: Diagnosis not present

## 2021-11-21 ENCOUNTER — Other Ambulatory Visit: Payer: Medicare Other

## 2021-11-21 DIAGNOSIS — E785 Hyperlipidemia, unspecified: Secondary | ICD-10-CM

## 2021-11-21 DIAGNOSIS — E039 Hypothyroidism, unspecified: Secondary | ICD-10-CM

## 2021-11-21 DIAGNOSIS — I10 Essential (primary) hypertension: Secondary | ICD-10-CM

## 2021-11-21 DIAGNOSIS — R7302 Impaired glucose tolerance (oral): Secondary | ICD-10-CM

## 2021-11-25 ENCOUNTER — Other Ambulatory Visit: Payer: Medicare Other

## 2021-11-25 DIAGNOSIS — E785 Hyperlipidemia, unspecified: Secondary | ICD-10-CM | POA: Diagnosis not present

## 2021-11-25 DIAGNOSIS — I10 Essential (primary) hypertension: Secondary | ICD-10-CM | POA: Diagnosis not present

## 2021-11-25 DIAGNOSIS — E039 Hypothyroidism, unspecified: Secondary | ICD-10-CM | POA: Diagnosis not present

## 2021-11-25 DIAGNOSIS — R7302 Impaired glucose tolerance (oral): Secondary | ICD-10-CM | POA: Diagnosis not present

## 2021-11-25 DIAGNOSIS — E611 Iron deficiency: Secondary | ICD-10-CM | POA: Diagnosis not present

## 2021-11-26 ENCOUNTER — Ambulatory Visit (INDEPENDENT_AMBULATORY_CARE_PROVIDER_SITE_OTHER): Payer: Medicare Other | Admitting: Internal Medicine

## 2021-11-26 ENCOUNTER — Encounter: Payer: Self-pay | Admitting: Internal Medicine

## 2021-11-26 VITALS — BP 104/70 | HR 61 | Temp 98.0°F | Ht 62.0 in | Wt 156.8 lb

## 2021-11-26 DIAGNOSIS — Z0000 Encounter for general adult medical examination without abnormal findings: Secondary | ICD-10-CM | POA: Diagnosis not present

## 2021-11-26 DIAGNOSIS — G4733 Obstructive sleep apnea (adult) (pediatric): Secondary | ICD-10-CM | POA: Diagnosis not present

## 2021-11-26 DIAGNOSIS — R011 Cardiac murmur, unspecified: Secondary | ICD-10-CM

## 2021-11-26 DIAGNOSIS — R413 Other amnesia: Secondary | ICD-10-CM

## 2021-11-26 DIAGNOSIS — D509 Iron deficiency anemia, unspecified: Secondary | ICD-10-CM

## 2021-11-26 DIAGNOSIS — Z8659 Personal history of other mental and behavioral disorders: Secondary | ICD-10-CM

## 2021-11-26 DIAGNOSIS — E611 Iron deficiency: Secondary | ICD-10-CM

## 2021-11-26 DIAGNOSIS — Z6828 Body mass index (BMI) 28.0-28.9, adult: Secondary | ICD-10-CM

## 2021-11-26 DIAGNOSIS — E039 Hypothyroidism, unspecified: Secondary | ICD-10-CM | POA: Diagnosis not present

## 2021-11-26 DIAGNOSIS — E7800 Pure hypercholesterolemia, unspecified: Secondary | ICD-10-CM | POA: Diagnosis not present

## 2021-11-26 DIAGNOSIS — I10 Essential (primary) hypertension: Secondary | ICD-10-CM

## 2021-11-26 LAB — POCT URINALYSIS DIPSTICK
Bilirubin, UA: NEGATIVE
Blood, UA: NEGATIVE
Glucose, UA: NEGATIVE
Ketones, UA: NEGATIVE
Leukocytes, UA: NEGATIVE
Nitrite, UA: NEGATIVE
Protein, UA: NEGATIVE
Spec Grav, UA: 1.02 (ref 1.010–1.025)
Urobilinogen, UA: 0.2 E.U./dL
pH, UA: 5 (ref 5.0–8.0)

## 2021-11-26 NOTE — Progress Notes (Signed)
Lab orders placed.  

## 2021-11-26 NOTE — Patient Instructions (Signed)
Patient has recurrent anemia. She and her husband have been reluctant tohave her take statin medication. Vaccines reviewed and suggestions made for Pneumococcal 20, Shingrix and annual flu vaccine. Suggest Covid booster in the Fall.  She is anemia once again. They would like referral back to [facility] for iron infusions as they worked well for patient vs oral therapy.

## 2021-11-26 NOTE — Progress Notes (Signed)
Annual Wellness Visit     Patient: [name], Female    DOB: May 01, 1950, 71 y.o.   MRN: [id number] Visit Date: 11/26/2021  Chief Complaint  Patient presents with   Medicare Wellness   Subjective    SAMYUKTHA BRAU is a 71 y.o. Female who presents today for her Annual Wellness Visit.  HPI She also presents for health maintenance exam and evaluation of medical issues.  She has a history of Rheumatoid arthritis followed by Dr. Nickola Major.  She has a history of memory loss, hypothyroidism, depression, impaired glucose tolerance, anxiety, hypertension and hyperlipidemia.  History of moderate sleep apnea but not interested in CPAP.  She has a longstanding history of iron deficiency.  Probably not taking iron supplement.  Serum iron is low at 42 and iron binding capacity is low  at 205.  She had colonoscopy in 2011 and does not want to repeat that.  Seen at Hematology for iron deficiency.  Needs to check back in with them.  Ferritin is 727 and is an acute phase reactant and is always high in this patient.  This is likely due to rheumatoid arthritis.  Husband is concerned that lipid medication causes memory loss.  Patient does have a history of memory issues.  Currently not taking this medication.  She takes Aricept for memory loss.  Takes Zoloft for depression.  Takes Maxide 25 for hypertension as well as lisinopril 10 mg daily.  Is on methotrexate for Rheumatoid arthritis as well as meloxicam.  History of hypothyroidism treated with levothyroxine 50 mcg daily.  Sometimes gets anxious.  Have refilled Xanax 0.5 mg to take twice daily as needed.  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  Was initially diagnosed with mild cognitive impairment by Dr. Leonides Cave, neuropsychologist in December 2016 when she started on Zoloft at that time for depression.  She had previous MRI of the brain with and without contrast showing no acute findings or abnormal lesions.  Had mild periventricular  and subcortical foci of nonspecific gliosis.  Has been seen at rheumatology for joint pain and arthralgias and has positive ANA and CCP.  ANA titer was 1: 320 and pattern was nuclear/speckled.  CCP was greater than 250 and rheumatoid factor was positive.  Rheumatoid arthritis was diagnosed and she is being treated with methotrexate 2.5 mg weekly.  Had allergy testing in 2007 by Dr. Salem Callas which was negative.  She saw ENT physician at that time for chronic sinusitis and was thought to have GE reflux.  CT of the sinuses was normal.  Remote history of iron deficiency anemia related to menorrhagia but that resolved with menopause.  Ganglion cyst removed from left hand 1999.  Bilateral tubal ligation 1993.  Thrombosed hemorrhoid excised by Dr. Derrell Lolling in 2005.  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  Husband is retired from [employer]. She has 3 adult children, 2 daughters and a son.  Husband is very supportive.  She has no behavioral issues.  She sleeps well.  Visits with neighbors.  Does not wander.  She does get confused at times.  Family history: Father with history of hypertension died of renal failure and congestive heart failure.  He also had a history of subdural hematoma.  He was 70 years old when he died.  Mother died at age 53 of diabetes and hypertension.  1 brother with history of hypertension on dialysis.  2 sisters-1 of whom is living with  history of colon cancer.  Younger sister died in 08/18/1994 of cerebral hemorrhage.            Review of Systems no new complaints.  Is quiet and cooperative.   Objective    Vitals: BP 104/70   Pulse 61   Temp 98 F (36.7 C) (Tympanic)   Ht 5\' 2"  (1.575 m)   Wt 156 lb 12.8 oz (71.1 kg)   SpO2 99%   BMI 28.68 kg/m   Physical Exam  Skin: Warm and dry.  No cervical adenopathy.  No thyromegaly.  TMs clear.  No carotid bruits.  Chest clear.  Cardiac exam: Regular rate and rhythm without ectopy.  Abdomen  soft nondistended without hepatosplenomegaly masses or tenderness.  No masses on bimanual exam.  Rectovaginal confirms.  No lower extremity pitting edema.  Her affect is quiet and cooperative.  She is mildly confused with regard to time.  She has normal  muscle strength.  Her sensation is intact.  Cranial nerves II through XII are grossly intact.  Deep tendon reflexes 2+ and symmetrical in the knees.  She is not oriented to time.   Most recent functional status assessment:    11/26/2021   10:58 AM  In your present state of health, do you have any difficulty performing the following activities:  Hearing? 0  Vision? 0  Difficulty concentrating or making decisions? 0  Walking or climbing stairs? 1  Dressing or bathing? 0  Doing errands, shopping? 0  Preparing Food and eating ? Y  Using the Toilet? N  In the past six months, have you accidently leaked urine? N  Do you have problems with loss of bowel control? N  Managing your Medications? N  Managing your Finances? Y  Housekeeping or managing your Housekeeping? Y   Most recent fall risk assessment:    11/26/2021   10:56 AM  Fall Risk   Falls in the past year? 0  Number falls in past yr: 0  Injury with Fall? 0  Risk for fall due to : No Fall Risks  Follow up Falls evaluation completed    Most recent depression screenings:    11/26/2021   10:56 AM 05/28/2021   11:33 AM  PHQ 2/9 Scores  PHQ - 2 Score 3 0  PHQ- 9 Score 8 0   Most recent cognitive screening:    11/26/2021   11:00 AM  6CIT Screen  What Year? 4 points  What month? 3 points  What time? 3 points  Count back from 20 0 points  Months in reverse 4 points  Repeat phrase 10 points  Total Score 24 points       Assessment & Plan   Memory loss-patient is quiet and cooperative.  Resides at home with husband and seems to be stable at this point in time.  Being treated with Aricept  Hyperlipidemia-husband prefers that she not take lipid-lowering medication  History of  moderate obstructive sleep apnea and has not wanted to pursue CPAP at this point  Rheumatoid [facility] at [facility] with Rheumatrex and meloxicam  Hypertension-stable with lisinopril and Maxide  History of depression treated with Zoloft  History of anxiety treated with Xanax.  Of hypothyroidism treated with levothyroxine 50 mcg daily  History of iron deficiency and has been seen at cancer center.-Referred back there due to low iron stores.  History of systolic murmur may be related to anemia but appears to be benign  Plan: Lipid-lowering medication will be discontinued.  Refer back to hematology regarding iron deficiency anemia.  Continue other medications as previously prescribed and follow-up in 6 months.  Husband knows he can contact me with any concerns whatsoever.      Annual wellness visit done today including the all of the following: Reviewed patient's Family Medical History Reviewed and updated list of patient's medical providers Assessment of cognitive impairment was done Assessed patient's functional ability Established a written schedule for health screening services Health Risk Assessent Completed and Reviewed  Discussed health benefits of physical activity, and encouraged her to engage in regular exercise appropriate for her age and condition.    IMargaree Mackintosh, MD, have reviewed all documentation for this visit. The documentation on 01/02/22 for the exam, diagnosis, procedures, and orders are all accurate and complete.      LaVon Philipp Deputy, CMA

## 2021-11-28 LAB — LIPID PANEL
Cholesterol: 259 mg/dL — ABNORMAL HIGH (ref <200)
HDL: 49 mg/dL — ABNORMAL LOW (ref >=50)
LDL Cholesterol (Calc): 190 mg/dL (calc) — ABNORMAL HIGH
Non-HDL Cholesterol (Calc): 210 mg/dL (calc) — ABNORMAL HIGH (ref <130)
Total CHOL/HDL Ratio: 5.3 (calc) — ABNORMAL HIGH (ref <5.0)
Triglycerides: 87 mg/dL (ref <150)

## 2021-11-28 LAB — COMPLETE METABOLIC PANEL WITH GFR
AG Ratio: 0.7 (calc) — ABNORMAL LOW (ref 1.0–2.5)
ALT: 11 U/L (ref 6–29)
AST: 14 U/L (ref 10–35)
Albumin: 3.7 g/dL (ref 3.6–5.1)
Alkaline phosphatase (APISO): 95 U/L (ref 37–153)
BUN: 15 mg/dL (ref 7–25)
CO2: 26 mmol/L (ref 20–32)
Calcium: 9.7 mg/dL (ref 8.6–10.4)
Chloride: 105 mmol/L (ref 98–110)
Creat: 0.65 mg/dL (ref 0.60–1.00)
Globulin: 5.1 g/dL (calc) — ABNORMAL HIGH (ref 1.9–3.7)
Glucose, Bld: 105 mg/dL — ABNORMAL HIGH (ref 65–99)
Potassium: 3.9 mmol/L (ref 3.5–5.3)
Sodium: 140 mmol/L (ref 135–146)
Total Bilirubin: 0.5 mg/dL (ref 0.2–1.2)
Total Protein: 8.8 g/dL — ABNORMAL HIGH (ref 6.1–8.1)
eGFR: 95 mL/min/{1.73_m2} (ref >=60)

## 2021-11-28 LAB — CBC WITH DIFFERENTIAL/PLATELET
Absolute Monocytes: 747 cells/uL (ref 200–950)
Basophils Absolute: 42 cells/uL (ref 0–200)
Basophils Relative: 0.5 %
Eosinophils Absolute: 58 cells/uL (ref 15–500)
Eosinophils Relative: 0.7 %
HCT: 33.8 % — ABNORMAL LOW (ref 35.0–45.0)
Hemoglobin: 10.8 g/dL — ABNORMAL LOW (ref 11.7–15.5)
Lymphs Abs: 1768 cells/uL (ref 850–3900)
MCH: 27.9 pg (ref 27.0–33.0)
MCHC: 32 g/dL (ref 32.0–36.0)
MCV: 87.3 fL (ref 80.0–100.0)
MPV: 10.3 fL (ref 7.5–12.5)
Monocytes Relative: 9 %
Neutro Abs: 5686 cells/uL (ref 1500–7800)
Neutrophils Relative %: 68.5 %
Platelets: 483 10*3/uL — ABNORMAL HIGH (ref 140–400)
RBC: 3.87 10*6/uL (ref 3.80–5.10)
RDW: 14.3 % (ref 11.0–15.0)
Total Lymphocyte: 21.3 %
WBC: 8.3 10*3/uL (ref 3.8–10.8)

## 2021-11-28 LAB — IRON,TIBC AND FERRITIN PANEL
%SAT: 20 % (calc) (ref 16–45)
Ferritin: 727 ng/mL — ABNORMAL HIGH (ref 16–288)
Iron: 42 ug/dL — ABNORMAL LOW (ref 45–160)
TIBC: 205 mcg/dL (calc) — ABNORMAL LOW (ref 250–450)

## 2021-11-28 LAB — HEMOGLOBIN A1C
Hgb A1c MFr Bld: 5.1 % of total Hgb (ref <5.7)
Mean Plasma Glucose: 100 mg/dL
eAG (mmol/L): 5.5 mmol/L

## 2021-11-28 LAB — TSH: TSH: 2.99 mIU/L (ref 0.40–4.50)

## 2021-12-09 ENCOUNTER — Telehealth: Payer: Self-pay | Admitting: *Deleted

## 2021-12-09 NOTE — Telephone Encounter (Signed)
Call received from Dr. Beryle Quant office requesting for pt to come in for iron infusion d/t recent lab results from their office.  Message sent to scheduling.

## 2021-12-11 ENCOUNTER — Other Ambulatory Visit: Payer: Self-pay | Admitting: Family

## 2021-12-11 DIAGNOSIS — D509 Iron deficiency anemia, unspecified: Secondary | ICD-10-CM

## 2021-12-12 ENCOUNTER — Encounter: Payer: Self-pay | Admitting: Family

## 2021-12-12 ENCOUNTER — Inpatient Hospital Stay: Payer: Medicare Other | Admitting: Family

## 2021-12-12 ENCOUNTER — Inpatient Hospital Stay: Payer: Medicare Other | Attending: Hematology & Oncology

## 2021-12-12 VITALS — BP 112/64 | HR 62 | Temp 98.1°F | Resp 18 | Wt 159.4 lb

## 2021-12-12 DIAGNOSIS — D509 Iron deficiency anemia, unspecified: Secondary | ICD-10-CM | POA: Diagnosis not present

## 2021-12-12 LAB — RETICULOCYTES
Immature Retic Fract: 13.6 % (ref 2.3–15.9)
RBC.: 3.72 MIL/uL — ABNORMAL LOW (ref 3.87–5.11)
Retic Count, Absolute: 85.9 10*3/uL (ref 19.0–186.0)
Retic Ct Pct: 2.3 % (ref 0.4–3.1)

## 2021-12-12 LAB — CBC WITH DIFFERENTIAL (CANCER CENTER ONLY)
Abs Immature Granulocytes: 0.04 10*3/uL (ref 0.00–0.07)
Basophils Absolute: 0 10*3/uL (ref 0.0–0.1)
Basophils Relative: 0 %
Eosinophils Absolute: 0.1 10*3/uL (ref 0.0–0.5)
Eosinophils Relative: 1 %
HCT: 33.5 % — ABNORMAL LOW (ref 36.0–46.0)
Hemoglobin: 10.3 g/dL — ABNORMAL LOW (ref 12.0–15.0)
Immature Granulocytes: 0 %
Lymphocytes Relative: 19 %
Lymphs Abs: 2.2 10*3/uL (ref 0.7–4.0)
MCH: 27.5 pg (ref 26.0–34.0)
MCHC: 30.7 g/dL (ref 30.0–36.0)
MCV: 89.3 fL (ref 80.0–100.0)
Monocytes Absolute: 1 10*3/uL (ref 0.1–1.0)
Monocytes Relative: 8 %
Neutro Abs: 8.2 10*3/uL — ABNORMAL HIGH (ref 1.7–7.7)
Neutrophils Relative %: 72 %
Platelet Count: 479 10*3/uL — ABNORMAL HIGH (ref 150–400)
RBC: 3.75 MIL/uL — ABNORMAL LOW (ref 3.87–5.11)
RDW: 15.6 % — ABNORMAL HIGH (ref 11.5–15.5)
WBC Count: 11.5 10*3/uL — ABNORMAL HIGH (ref 4.0–10.5)
nRBC: 0 % (ref 0.0–0.2)

## 2021-12-12 LAB — IRON AND IRON BINDING CAPACITY (CC-WL,HP ONLY)
Iron: 17 ug/dL — ABNORMAL LOW (ref 28–170)
Saturation Ratios: 7 % — ABNORMAL LOW (ref 10.4–31.8)
TIBC: 228 ug/dL — ABNORMAL LOW (ref 250–450)
UIBC: 211 ug/dL (ref 148–442)

## 2021-12-12 LAB — FERRITIN: Ferritin: 762 ng/mL — ABNORMAL HIGH (ref 11–307)

## 2021-12-12 NOTE — Progress Notes (Signed)
Hematology and Oncology Follow Up Visit  [name] [id number] 01-26-1951 71 y.o. 12/12/2021   Principle Diagnosis:  Iron deficiency anemia    Current Therapy:        IV iron as indicated    Interim History:  [name] is here today for follow-up. She is doing well and has no complaints at this time.  No blood loss noted. No bruising or petechiae.  No fever, chills, n/v, cough, rash, dizziness, SOB, chest pain, palpitations, abdominal pain or changes in bowel or bladder habits.  No swelling, tenderness, numbness or tingling in her extremities at this time. She has occasional puffiness in her feet and ankles that comes and goes.  No falls or syncope.  Appetite is good but she admits that she needs to hydrate better throughout the day. Her weight is stable at 159 lbs.   ECOG Performance Status: 1 - Symptomatic but completely ambulatory  Medications:  Allergies as of 12/12/2021   No Known Allergies      Medication List        Accurate as of December 12, 2021 11:02 AM. If you have any questions, ask your nurse or doctor.          ALPRAZolam 0.5 MG tablet Commonly known as: XANAX TAKE ONE TABLET BY MOUTH TWICE A DAY AS NEEDED FOR ANXIETY   cyanocobalamin 500 MCG tablet Commonly known as: VITAMIN B12 Take 500 mcg by mouth daily.   donepezil 10 MG tablet Commonly known as: Aricept Take 1 tablet (10 mg total) by mouth at bedtime.   ferrous sulfate 325 (65 FE) MG tablet   folic acid 1 MG tablet Commonly known as: FOLVITE Take 1 tablet by mouth daily.   levothyroxine 50 MCG tablet Commonly known as: SYNTHROID Take 1 tablet (50 mcg total) by mouth daily.   lisinopril 10 MG tablet Commonly known as: ZESTRIL TAKE ONE TABLET BY MOUTH DAILY   meloxicam 15 MG tablet Commonly known as: MOBIC Take 1 tablet (15 mg total) by mouth daily.   methotrexate 2.5 MG tablet Commonly known as: RHEUMATREX Take 2.5 mg by mouth once a week. Caution:Chemotherapy. Protect from  light.   Multi Vitamin Tabs 1 tablet   multivitamin with minerals tablet Take 1 tablet by mouth daily.   Omega-3 1000 MG Caps Take by mouth daily.   rosuvastatin 20 MG tablet Commonly known as: Crestor Take 1 tablet (20 mg total) by mouth daily.   sertraline 100 MG tablet Commonly known as: ZOLOFT TAKE ONE TABLET BY MOUTH DAILY   triamterene-hydrochlorothiazide 37.5-25 MG tablet Commonly known as: MAXZIDE-25 TAKE ONE TABLET BY MOUTH DAILY   Vitamin D 1000 units capsule Take 1,000 Units by mouth daily.   vitamin E 180 MG (400 UNITS) capsule Take 400 Units by mouth daily.        Allergies: No Known Allergies  Past Medical History, Surgical history, Social history, and Family History were reviewed and updated.  Review of Systems: All other 10 point review of systems is negative.   Physical Exam:  weight is 159 lb 6.4 oz (72.3 kg). Her oral temperature is 98.1 F (36.7 C). Her blood pressure is 112/64 and her pulse is 62. Her respiration is 18 and oxygen saturation is 100%.   Wt Readings from Last 3 Encounters:  12/12/21 159 lb 6.4 oz (72.3 kg)  11/26/21 156 lb 12.8 oz (71.1 kg)  05/28/21 167 lb 12 oz (76.1 kg)    Ocular: Sclerae unicteric, pupils equal, round and  reactive to light Ear-nose-throat: Oropharynx clear, dentition fair Lymphatic: No cervical or supraclavicular adenopathy Lungs no rales or rhonchi, good excursion bilaterally Heart regular rate and rhythm, no murmur appreciated Abd soft, nontender, positive bowel sounds MSK no focal spinal tenderness, no joint edema Neuro: non-focal, well-oriented, appropriate affect Breasts: Deferred   Lab Results  Component Value Date   WBC 11.5 (H) 12/12/2021   HGB 10.3 (L) 12/12/2021   HCT 33.5 (L) 12/12/2021   MCV 89.3 12/12/2021   PLT 479 (H) 12/12/2021   Lab Results  Component Value Date   FERRITIN 727 (H) 11/25/2021   IRON 42 (L) 11/25/2021   TIBC 205 (L) 11/25/2021   UIBC 123 11/19/2020    IRONPCTSAT 20 11/25/2021   Lab Results  Component Value Date   RETICCTPCT 2.3 12/12/2021   RBC 3.75 (L) 12/12/2021   RBC 3.72 (L) 12/12/2021   RETICCTABS 65,110 05/01/2020   No results found for: "KPAFRELGTCHN", "LAMBDASER", "KAPLAMBRATIO" No results found for: "IGGSERUM", "IGA", "IGMSERUM" Lab Results  Component Value Date   ALBUMINELP 3.7 (L) 08/25/2019   A1GS 0.4 (H) 08/25/2019   A2GS 0.8 08/25/2019   BETS 0.5 08/25/2019   BETA2SER 0.6 (H) 08/25/2019   GAMS 2.0 (H) 08/25/2019   SPEI  08/25/2019     Comment:     . One or more serum protein fractions are outside the normal ranges.  No abnormal protein bands are apparent. .      Chemistry      Component Value Date/Time   NA 140 11/25/2021 1210   K 3.9 11/25/2021 1210   CL 105 11/25/2021 1210   CO2 26 11/25/2021 1210   BUN 15 11/25/2021 1210   CREATININE 0.65 11/25/2021 1210      Component Value Date/Time   CALCIUM 9.7 11/25/2021 1210   ALKPHOS 113 08/07/2020 1256   AST 14 11/25/2021 1210   AST 15 08/07/2020 1256   ALT 11 11/25/2021 1210   ALT 18 08/07/2020 1256   BILITOT 0.5 11/25/2021 1210   BILITOT 0.4 08/07/2020 1256       Impression and Plan: [name] is a very pleasant 71 yo African American female with iron deficiency anemia.   Iron studies are pending.  Follow-up in 4 months.   Eileen Stanford, NP 8/24/202311:02 AM

## 2021-12-25 DIAGNOSIS — M0579 Rheumatoid arthritis with rheumatoid factor of multiple sites without organ or systems involvement: Secondary | ICD-10-CM | POA: Diagnosis not present

## 2022-01-16 ENCOUNTER — Other Ambulatory Visit: Payer: Self-pay | Admitting: Internal Medicine

## 2022-01-21 ENCOUNTER — Other Ambulatory Visit: Payer: Self-pay | Admitting: *Deleted

## 2022-01-21 NOTE — Patient Outreach (Signed)
  Care Coordination   01/21/2022 Name: CHRISLYNN MOSELY MRN: [id number] DOB: [birth year]   Care Coordination Outreach Attempts:  Active pt on quarterly calls and was scheduled for an assessment and case closure today. An unsuccessful telephone outreach was attempted today to offer the patient information about available care coordination services as a benefit of their health plan.   Follow Up Plan:  Additional outreach attempts will be made to offer the patient care coordination information and services.   Encounter Outcome:  No Answer {THN Tip this will not be part of the note when signed-REQUIRED REPORT FIELD DO NOT DELETE (Optional):27901} Care Coordination Interventions Activated:  No  {THN Tip this will not be part of the note when signed-REQUIRED REPORT FIELD DO NOT DELETE (Optional):27901} Care Coordination Interventions:  No, not indicated  {THN Tip this will not be part of the note when signed-REQUIRED REPORT FIELD DO NOT DELETE (Optional):27901}  SIG Delray Reza C. Myrtie Neither, MSN, [facility] Gerontological Nurse Practitioner [facility] Care Management [phone]

## 2022-01-22 ENCOUNTER — Telehealth: Payer: Self-pay | Admitting: *Deleted

## 2022-01-22 NOTE — Patient Outreach (Signed)
  Care Coordination   Follow Up Visit Note   01/22/2022 Name: [name] MRN: [id number] DOB: 11-19-1950  [name] is a 71 y.o. year old female who sees Baxley, Cresenciano Lick, MD for primary care. I spoke with  Mr. [name], [name] husband by phone today.  What matters to the patients health and wellness today?  Maintaining wife's memory and working through the challenges of her cognitive issues.   Goals Addressed             This Visit's Progress    Husband's concern is to help his wife maintain her memory and provide appropriate care for her.       Care Coordination Interventions: Assessed current status per husband's report: Stable, has some sundowning, he is getting some respite time from her two sisters who assist and allow him to go golfing. Wife has refused to take the recommended Aricept. Advised husband to talk with her about the importance of the medication as it may slow down the progress of her mental decline and help maintain her quality of life. Encouraged him to elicit the assistance of her daughter's to help with this. Discussed ways to minimize sundowning. Advised to ask Dr. Renold Khan to specifically recheck her cognition to see if there has been a decline which if there is may be an influencing factor for her to change her mind about taking it.          SDOH assessments and interventions completed:  No     Care Coordination Interventions Activated:  Yes  Care Coordination Interventions:  Yes, provided   Follow up plan: Follow up call scheduled for 6 months and will plan on closing case or passing her case to another care manager. Current care manager is retiring in   April 2024  Encounter Outcome:  Pt. Visit Completed   Eulah Pont. Myrtie Neither, MSN, [facility] Gerontological Nurse Practitioner [facility] Care Management [phone]

## 2022-02-19 DIAGNOSIS — M0579 Rheumatoid arthritis with rheumatoid factor of multiple sites without organ or systems involvement: Secondary | ICD-10-CM | POA: Diagnosis not present

## 2022-02-19 DIAGNOSIS — Z79899 Other long term (current) drug therapy: Secondary | ICD-10-CM | POA: Diagnosis not present

## 2022-03-05 DIAGNOSIS — M0579 Rheumatoid arthritis with rheumatoid factor of multiple sites without organ or systems involvement: Secondary | ICD-10-CM | POA: Diagnosis not present

## 2022-03-05 DIAGNOSIS — M2550 Pain in unspecified joint: Secondary | ICD-10-CM | POA: Diagnosis not present

## 2022-03-05 DIAGNOSIS — R5382 Chronic fatigue, unspecified: Secondary | ICD-10-CM | POA: Diagnosis not present

## 2022-03-05 DIAGNOSIS — M1991 Primary osteoarthritis, unspecified site: Secondary | ICD-10-CM | POA: Diagnosis not present

## 2022-03-07 ENCOUNTER — Other Ambulatory Visit: Payer: Self-pay

## 2022-03-07 ENCOUNTER — Emergency Department (HOSPITAL_COMMUNITY): Payer: Medicare Other

## 2022-03-07 ENCOUNTER — Emergency Department (HOSPITAL_COMMUNITY)
Admission: EM | Admit: 2022-03-07 | Discharge: 2022-03-08 | Disposition: A | Discharge status: Home / Self Care | Payer: Medicare Other | Attending: Emergency Medicine | Admitting: Emergency Medicine

## 2022-03-07 ENCOUNTER — Encounter (HOSPITAL_COMMUNITY): Payer: Self-pay

## 2022-03-07 DIAGNOSIS — K8020 Calculus of gallbladder without cholecystitis without obstruction: Secondary | ICD-10-CM | POA: Diagnosis not present

## 2022-03-07 DIAGNOSIS — I10 Essential (primary) hypertension: Secondary | ICD-10-CM | POA: Diagnosis not present

## 2022-03-07 DIAGNOSIS — R0789 Other chest pain: Secondary | ICD-10-CM

## 2022-03-07 DIAGNOSIS — Z79899 Other long term (current) drug therapy: Secondary | ICD-10-CM | POA: Insufficient documentation

## 2022-03-07 DIAGNOSIS — J9811 Atelectasis: Secondary | ICD-10-CM | POA: Diagnosis not present

## 2022-03-07 DIAGNOSIS — R079 Chest pain, unspecified: Secondary | ICD-10-CM | POA: Diagnosis not present

## 2022-03-07 LAB — CBC WITH DIFFERENTIAL/PLATELET
Abs Immature Granulocytes: 0.01 10*3/uL (ref 0.00–0.07)
Basophils Absolute: 0 10*3/uL (ref 0.0–0.1)
Basophils Relative: 0 %
Eosinophils Absolute: 0 10*3/uL (ref 0.0–0.5)
Eosinophils Relative: 0 %
HCT: 35.9 % — ABNORMAL LOW (ref 36.0–46.0)
Hemoglobin: 11.2 g/dL — ABNORMAL LOW (ref 12.0–15.0)
Immature Granulocytes: 0 %
Lymphocytes Relative: 24 %
Lymphs Abs: 1.8 10*3/uL (ref 0.7–4.0)
MCH: 28.4 pg (ref 26.0–34.0)
MCHC: 31.2 g/dL (ref 30.0–36.0)
MCV: 91.1 fL (ref 80.0–100.0)
Monocytes Absolute: 0.6 10*3/uL (ref 0.1–1.0)
Monocytes Relative: 8 %
Neutro Abs: 5.1 10*3/uL (ref 1.7–7.7)
Neutrophils Relative %: 68 %
Platelets: 445 10*3/uL — ABNORMAL HIGH (ref 150–400)
RBC: 3.94 MIL/uL (ref 3.87–5.11)
RDW: 15.6 % — ABNORMAL HIGH (ref 11.5–15.5)
WBC: 7.5 10*3/uL (ref 4.0–10.5)
nRBC: 0 % (ref 0.0–0.2)

## 2022-03-07 LAB — BASIC METABOLIC PANEL
Anion gap: 13 (ref 5–15)
BUN: 20 mg/dL (ref 8–23)
CO2: 23 mmol/L (ref 22–32)
Calcium: 9.8 mg/dL (ref 8.9–10.3)
Chloride: 100 mmol/L (ref 98–111)
Creatinine, Ser: 0.72 mg/dL (ref 0.44–1.00)
GFR, Estimated: >60 mL/min (ref >60)
Glucose, Bld: 111 mg/dL — ABNORMAL HIGH (ref 70–99)
Potassium: 3.8 mmol/L (ref 3.5–5.1)
Sodium: 136 mmol/L (ref 135–145)

## 2022-03-07 LAB — TROPONIN I (HIGH SENSITIVITY)
Troponin I (High Sensitivity): 3 ng/L (ref <18)
Troponin I (High Sensitivity): 3 ng/L (ref <18)

## 2022-03-07 NOTE — ED Provider Notes (Signed)
[facility] EMERGENCY DEPARTMENT Provider Note   CSN: [id number] Arrival date & time: 03/07/22  1557     History {Add pertinent medical, surgical, social history, OB history to HPI:1} Chief Complaint  Patient presents with   Chest Pain    [name] is a 71 y.o. female.  HPI     This is a 71 year old female who presents with chest pain.  Patient reports she had onset of chest pain around 4 PM today after eating a late brunch.  She reports that the pain was anterior nonradiating.  It was dull in nature.  Pain has significantly improved.  No known history of coronary artery disease.  She does have a history of hypertension.  No hyperlipidemia, diabetes, or smoking.  Denies any recent fevers or cough.  No history of blood clots.  She is currently on prednisone and started this several days ago.  Home Medications Prior to Admission medications   Medication Sig Start Date End Date Taking? Authorizing Provider  ALPRAZolam Prudy Feeler) 0.5 MG tablet TAKE ONE TABLET BY MOUTH TWICE A DAY AS NEEDED FOR ANXIETY 10/05/21   Margaree Mackintosh, MD  Cholecalciferol (VITAMIN D) 1000 UNITS capsule Take 1,000 Units by mouth daily. Patient not taking: Reported on 11/26/2021    [provider]  cyanocobalamin 500 MCG tablet Take 500 mcg by mouth daily.    [provider]  donepezil (ARICEPT) 10 MG tablet Take 1 tablet (10 mg total) by mouth at bedtime. Patient not taking: Reported on 12/12/2021 05/01/20   Margaree Mackintosh, MD  ferrous sulfate 325 (65 FE) MG tablet  12/05/19   [provider]  folic acid (FOLVITE) 1 MG tablet Take 1 tablet by mouth daily.    [provider]  levothyroxine (SYNTHROID) 50 MCG tablet Take 1 tablet (50 mcg total) by mouth daily. 11/23/20   Margaree Mackintosh, MD  lisinopril (ZESTRIL) 10 MG tablet TAKE ONE TABLET BY MOUTH DAILY 03/22/21   Margaree Mackintosh, MD  meloxicam (MOBIC) 15 MG tablet Take 1 tablet (15 mg total) by mouth  daily. Patient not taking: Reported on 11/26/2021 11/15/19   Margaree Mackintosh, MD  methotrexate (RHEUMATREX) 2.5 MG tablet Take 2.5 mg by mouth once a week. Caution:Chemotherapy. Protect from light. Patient not taking: Reported on 11/26/2021    [provider]  Multiple Vitamin (MULTI VITAMIN) TABS 1 tablet 12/05/19   [provider]  Multiple Vitamins-Minerals (MULTIVITAMIN WITH MINERALS) tablet Take 1 tablet by mouth daily.    [provider]  Omega-3 1000 MG CAPS Take by mouth daily.    [provider]  rosuvastatin (CRESTOR) 20 MG tablet Take 1 tablet (20 mg total) by mouth daily. Patient not taking: Reported on 11/26/2021 11/23/20   Margaree Mackintosh, MD  sertraline (ZOLOFT) 100 MG tablet TAKE ONE TABLET BY MOUTH DAILY 06/29/21   Margaree Mackintosh, MD  triamterene-hydrochlorothiazide (MAXZIDE-25) 37.5-25 MG tablet TAKE ONE TABLET BY MOUTH DAILY 01/16/22   Margaree Mackintosh, MD  vitamin E 180 MG (400 UNITS) capsule Take 400 Units by mouth daily. Patient not taking: Reported on 11/26/2021    [provider]      Allergies    Patient has no known allergies.    Review of Systems   Review of Systems  Constitutional:  Negative for fever.  Respiratory:  Negative for shortness of breath.   Cardiovascular:  Positive for chest pain.  All other systems reviewed and are negative.  Physical Exam Updated Vital Signs BP 131/62   Pulse (!) 54   Temp 98.5 F (36.9 C)   Resp 18   Ht 1.524 m (5')   Wt 73 kg   SpO2 99%   BMI 31.44 kg/m  Physical Exam Vitals and nursing note reviewed.  Constitutional:      Appearance: She is well-developed. She is not ill-appearing.  HENT:     Head: Normocephalic and atraumatic.  Eyes:     Pupils: Pupils are equal, round, and reactive to light.  Cardiovascular:     Rate and Rhythm: Normal rate and regular rhythm.     Heart sounds: Murmur heard.  Pulmonary:     Effort: Pulmonary effort is normal. No respiratory distress.      Breath sounds: No wheezing.  Abdominal:     General: Bowel sounds are normal.     Palpations: Abdomen is soft.  Musculoskeletal:     Cervical back: Neck supple.     Right lower leg: No edema.     Left lower leg: No edema.  Skin:    General: Skin is warm and dry.  Neurological:     Mental Status: She is alert and oriented to person, place, and time.  Psychiatric:        Mood and Affect: Mood normal.     ED Results / Procedures / Treatments   Labs (all labs ordered are listed, but only abnormal results are displayed) Labs Reviewed  BASIC METABOLIC PANEL - Abnormal; Notable for the following components:      Result Value   Glucose, Bld 111 (*)    All other components within normal limits  CBC WITH DIFFERENTIAL/PLATELET - Abnormal; Notable for the following components:   Hemoglobin 11.2 (*)    HCT 35.9 (*)    RDW 15.6 (*)    Platelets 445 (*)    All other components within normal limits  D-DIMER, QUANTITATIVE  TROPONIN I (HIGH SENSITIVITY)  TROPONIN I (HIGH SENSITIVITY)    EKG EKG Interpretation  Date/Time:  Friday March 07 2022 16:18:09 EST Ventricular Rate:  61 PR Interval:  132 QRS Duration: 74 QT Interval:  420 QTC Calculation: 422 R Axis:   -1 Text Interpretation: Normal sinus rhythm Minimal voltage criteria for LVH, may be normal variant ( R in aVL ) Cannot rule out Anterior infarct , age undetermined Abnormal ECG No significant change since last tracing Confirmed by Elayne Snare ([id number]) on 03/07/2022 10:24:09 PM  Radiology DG Chest 2 View  Result Date: 03/07/2022 CLINICAL DATA:  Chest pain. Left-sided pain onset this morning. EXAM: CHEST - 2 VIEW COMPARISON:  Remote radiograph 04/12/2009 FINDINGS: The cardiomediastinal contours are normal. Bandlike subsegmental atelectasis or scarring in the left lung base. Pulmonary vasculature is normal. No consolidation, pleural effusion, or pneumothorax. No acute osseous abnormalities are seen. IMPRESSION: Bandlike  subsegmental atelectasis or scarring in the left lung base. Electronically Signed   By: Narda Rutherford M.D.   On: 03/07/2022 17:47    Procedures Procedures  {Document cardiac monitor, telemetry assessment procedure when appropriate:1}  Medications Ordered in ED Medications - No data to display  ED Course/ Medical Decision Making/ A&P                           Medical Decision Making Amount and/or Complexity of Data Reviewed Labs: ordered.   ***  {Document critical care time when appropriate:1} {Document review of labs and clinical decision tools ie heart  score, Chads2Vasc2 etc:1}  {Document your independent review of radiology images, and any outside records:1} {Document your discussion with family members, caretakers, and with consultants:1} {Document social determinants of health affecting pt's care:1} {Document your decision making why or why not admission, treatments were needed:1} Final Clinical Impression(s) / ED Diagnoses Final diagnoses:  None    Rx / DC Orders ED Discharge Orders     None

## 2022-03-07 NOTE — ED Provider Triage Note (Signed)
Emergency Medicine Provider Triage Evaluation Note  [name] , a 71 y.o. female  was evaluated in triage.  Pt complains of chest pain that happened this morning couple hours after breakfast.  Patient was at rest when the chest pain started.  Patient's husband states he found her lying across the bed and was complaining of pain.  She denies nausea, vomiting, syncope, shortness of breath, dizziness, lightheadedness, syncope.  Denies history of heart problems aside from murmur.  Review of Systems  Positive: See above Negative: See above  Physical Exam  BP 129/83 (BP Location: Left Arm)   Pulse 61   Temp 98.6 F (37 C)   Resp 16   Ht 5' (1.524 m)   Wt 73 kg   SpO2 98%   BMI 31.44 kg/m  Gen:   Awake, no distress   Resp:  Normal effort, lungs clear bilaterally MSK:   Moves extremities without difficulty  Other:  Pulse irregular; heart rate normal, loud murmur appreciated on exam  Medical Decision Making  Medically screening exam initiated at 4:47 PM.  Appropriate orders placed.  SOPHIRA RUMLER was informed that the remainder of the evaluation will be completed by another provider, this initial triage assessment does not replace that evaluation, and the importance of remaining in the ED until their evaluation is complete.     Melton Alar R, [place] 03/07/22 1649

## 2022-03-07 NOTE — ED Triage Notes (Signed)
Pt reports left sided sharp chest pain that started this morning before breakfast. She states the pain has subsided some but she can still feel the pain a little bit. Denies n/v/dizziness/SOB.

## 2022-03-07 NOTE — ED Notes (Signed)
The pt had some chest pain earlier today that radiated up into both her shoulders.  No chest pain now she still has the bi-lateral shoulder pain  she has a history of arthritis and had a rx for prednisone

## 2022-03-08 ENCOUNTER — Emergency Department (HOSPITAL_COMMUNITY): Payer: Medicare Other

## 2022-03-08 DIAGNOSIS — R079 Chest pain, unspecified: Secondary | ICD-10-CM | POA: Diagnosis not present

## 2022-03-08 DIAGNOSIS — J9811 Atelectasis: Secondary | ICD-10-CM | POA: Diagnosis not present

## 2022-03-08 DIAGNOSIS — K8020 Calculus of gallbladder without cholecystitis without obstruction: Secondary | ICD-10-CM | POA: Diagnosis not present

## 2022-03-08 LAB — D-DIMER, QUANTITATIVE: D-Dimer, Quant: 3.71 ug/mL-FEU — ABNORMAL HIGH (ref 0.00–0.50)

## 2022-03-08 MED ORDER — IOHEXOL 350 MG/ML SOLN
75.0000 mL | Freq: Once | INTRAVENOUS | Status: AC | PRN
  Administered 2022-03-08: 75 mL via INTRAVENOUS

## 2022-03-08 NOTE — Discharge Instructions (Signed)
You were seen today for chest pain.  Your work-up today is largely reassuring.  You have been referred to cardiology as an outpatient.  Follow-up with cardiology.  Your CT scan did not show any evidence of blood clots.  He did have some enlarged lymph nodes in your chest which could be reactive.  Follow-up with your primary doctor for reevaluation.

## 2022-03-08 NOTE — ED Notes (Addendum)
a 

## 2022-03-10 ENCOUNTER — Telehealth: Payer: Self-pay | Admitting: Internal Medicine

## 2022-03-10 NOTE — Telephone Encounter (Signed)
[name] [phone]  Genevie Cheshire called to say he had taking Jakerra to ED with chest pains, every thing check out okay, they just said to follow up with you. His biggest concern is they are to leave town for the [employer], do you think it will be okay. She has done good since going home, even went for walk yesterday.

## 2022-03-20 ENCOUNTER — Telehealth: Payer: Self-pay

## 2022-03-20 NOTE — Telephone Encounter (Signed)
     Patient  visit on 03/08/2022  at [facility] was for Other chest pain.  Have you been able to follow up with your primary care physician? Patient stated it was not suggested that she follow up with her PCP.  The patient was or was not able to obtain any needed medicine or equipment. No medications prescribed.  Are there diet recommendations that you are having difficulty following? No  Patient expresses understanding of discharge instructions and education provided has no other needs at this time.    Kirill Chatterjee [facility]  [facility] Population Health Community Resource Care Guide   [email or web address]  ?? [phone]   Website: [email or web address]  [email or web address]

## 2022-03-27 ENCOUNTER — Ambulatory Visit (INDEPENDENT_AMBULATORY_CARE_PROVIDER_SITE_OTHER): Payer: Medicare Other | Admitting: Pulmonary Disease

## 2022-03-27 ENCOUNTER — Encounter: Payer: Self-pay | Admitting: Pulmonary Disease

## 2022-03-27 VITALS — BP 118/66 | HR 74 | Temp 98.4°F | Ht 62.0 in | Wt 158.0 lb

## 2022-03-27 DIAGNOSIS — R590 Localized enlarged lymph nodes: Secondary | ICD-10-CM

## 2022-03-27 NOTE — Patient Instructions (Signed)
Will get a PET/CT in 3 for evaluation of lymph nodes Return to clinic after scan.

## 2022-03-27 NOTE — Progress Notes (Signed)
[name]    [id number]    1950/12/04  Primary Care Physician:Baxley, Luanna Cole, MD  Referring Physician: Margaree Mackintosh, MD [address] [place],  [place] [address]  Chief complaint: Consult for lymphadenopathy  HPI: 71 y.o. who  has a past medical history of Anemia, Hyperlipidemia, Hypertension, Pre-diabetes, Sleep apnea, and Sleep apnea.,  Rheumatoid arthritis  Patient was seen in the emergency room on 03/07/2022 with atypical chest pain.  Troponins x 2 was negative and EKG without acute ischemic changes.  She obtain CT angiogram as D-dimer was elevated which did not show any blood clot.  There was a notation of axillary lymphadenopathy and has been referred here for further evaluation  History notable for rheumatoid arthritis.  She follows with [facility] rheumatology and had been on methotrexate until 2022 which was stopped and treatment changed to Arava and leflunomide due to persistent joint symptoms.  She states that her arthritis is under better control now  Pets: Used to have a dog Occupation: Works for Clinical biochemist in [employer] Exposures: No mold, hot tub, Jacuzzi.  No feather pillows or comforters Smoking history: Never smoker Travel history: No significant travel history Relevant family history: No family history of lung disease.  Outpatient Encounter Medications as of 03/27/2022  Medication Sig   cyanocobalamin 500 MCG tablet Take 500 mcg by mouth daily.   folic acid (FOLVITE) 1 MG tablet Take 1 tablet by mouth daily.   leflunomide (ARAVA) 20 MG tablet Take 20 mg by mouth daily.   lisinopril (ZESTRIL) 10 MG tablet TAKE ONE TABLET BY MOUTH DAILY   Multiple Vitamin (MULTI VITAMIN) TABS 1 tablet   Multiple Vitamins-Minerals (MULTIVITAMIN WITH MINERALS) tablet Take 1 tablet by mouth daily.   Omega-3 1000 MG CAPS Take by mouth daily.   sertraline (ZOLOFT) 100 MG tablet TAKE ONE TABLET BY MOUTH DAILY   triamterene-hydrochlorothiazide (MAXZIDE-25)  37.5-25 MG tablet TAKE ONE TABLET BY MOUTH DAILY   vitamin E 180 MG (400 UNITS) capsule Take 400 Units by mouth daily.   ALPRAZolam (XANAX) 0.5 MG tablet TAKE ONE TABLET BY MOUTH TWICE A DAY AS NEEDED FOR ANXIETY (Patient not taking: Reported on 03/27/2022)   Cholecalciferol (VITAMIN D) 1000 UNITS capsule Take 1,000 Units by mouth daily. (Patient not taking: Reported on 11/26/2021)   donepezil (ARICEPT) 10 MG tablet Take 1 tablet (10 mg total) by mouth at bedtime. (Patient not taking: Reported on 12/12/2021)   ferrous sulfate 325 (65 FE) MG tablet  (Patient not taking: Reported on 03/27/2022)   levothyroxine (SYNTHROID) 50 MCG tablet Take 1 tablet (50 mcg total) by mouth daily. (Patient not taking: Reported on 03/27/2022)   meloxicam (MOBIC) 15 MG tablet Take 1 tablet (15 mg total) by mouth daily. (Patient not taking: Reported on 11/26/2021)   methotrexate (RHEUMATREX) 2.5 MG tablet Take 2.5 mg by mouth once a week. Caution:Chemotherapy. Protect from light. (Patient not taking: Reported on 11/26/2021)   rosuvastatin (CRESTOR) 20 MG tablet Take 1 tablet (20 mg total) by mouth daily. (Patient not taking: Reported on 11/26/2021)   No facility-administered encounter medications on file as of 03/27/2022.    Allergies as of 03/27/2022   (No Known Allergies)    Past Medical History:  Diagnosis Date   Anemia    Hyperlipidemia    Hypertension    Pre-diabetes    Sleep apnea    Sleep apnea     Past Surgical History:  Procedure Laterality Date  GANGLION CYST EXCISION     l hand   JOINT REPLACEMENT     knee right   TUBAL LIGATION      Family History  Problem Relation Age of Onset   Diabetes Mother    Hypertension Mother    Kidney disease Father    Heart disease Father    Stroke Sister    Sleep apnea Neg Hx     Social History   Socioeconomic History   Marital status: Married    Spouse name: Not on file   Number of children: Not on file   Years of education: Not on file   Highest education  level: Not on file  Occupational History   Not on file  Tobacco Use   Smoking status: Never   Smokeless tobacco: Never  Vaping Use   Vaping Use: Never used  Substance and Sexual Activity   Alcohol use: Yes    Comment: rarely   Drug use: No   Sexual activity: Not on file  Other Topics Concern   Not on file  Social History Narrative   Not on file   Social Determinants of Health   Financial Resource Strain: Not on file  Food Insecurity: No Food Insecurity (05/16/2020)   Hunger Vital Sign    Worried About Running Out of Food in the Last Year: Never true    Ran Out of Food in the Last Year: Never true  Transportation Needs: No Transportation Needs (05/16/2020)   PRAPARE - Administrator, Civil Service (Medical): No    Lack of Transportation (Non-Medical): No  Physical Activity: Not on file  Stress: Not on file  Social Connections: Not on file  Intimate Partner Violence: Not on file    Review of systems: Review of Systems  Constitutional: Negative for fever and chills.  HENT: Negative.   Eyes: Negative for blurred vision.  Respiratory: as per HPI  Cardiovascular: Negative for chest pain and palpitations.  Gastrointestinal: Negative for vomiting, diarrhea, blood per rectum. Genitourinary: Negative for dysuria, urgency, frequency and hematuria.  Musculoskeletal: Negative for myalgias, back pain and joint pain.  Skin: Negative for itching and rash.  Neurological: Negative for dizziness, tremors, focal weakness, seizures and loss of consciousness.  Endo/Heme/Allergies: Negative for environmental allergies.  Psychiatric/Behavioral: Negative for depression, suicidal ideas and hallucinations.  All other systems reviewed and are negative.  Physical Exam: Blood pressure 118/66, pulse 74, temperature 98.4 F (36.9 C), temperature source Oral, height 5\' 2"  (1.575 m), weight 158 lb (71.7 kg), SpO2 100 %. Gen:      No acute distress HEENT:  EOMI, sclera anicteric Neck:      No masses; no thyromegaly Lungs:    Clear to auscultation bilaterally; normal respiratory effort CV:         Regular rate and rhythm; no murmurs Abd:      + bowel sounds; soft, non-tender; no palpable masses, no distension Ext:    No edema; adequate peripheral perfusion Skin:      Warm and dry; no rash Neuro: alert and oriented x 3 Psych: normal mood and affect  Data Reviewed: Imaging: CTA 03/08/2022-no pulmonary embolism, bibasilar atelectasis, enlarged axillary lymph nodes.  No mediastinal or hilar lymphadenopathy. I have reviewed the images personally.  PFTs:  Labs:  Assessment:  Lymphadenopathy I had reviewed the CT scan and although it states hilar lymph node enlargement in final read she only has bilateral axillary lymphadenopathy which appears nonspecific, reactive in nature  Will monitor those  and obtain a PET scan in 3 months  Plan/Recommendations: PET scan  Chilton Greathouse MD [facility] 03/27/2022, 11:13 AM  CC: Margaree Mackintosh, MD

## 2022-03-29 ENCOUNTER — Other Ambulatory Visit: Payer: Self-pay | Admitting: Internal Medicine

## 2022-04-08 ENCOUNTER — Inpatient Hospital Stay: Payer: Medicare Other | Attending: Hematology & Oncology

## 2022-04-08 ENCOUNTER — Other Ambulatory Visit: Payer: Self-pay | Admitting: Internal Medicine

## 2022-04-08 ENCOUNTER — Encounter: Payer: Self-pay | Admitting: Family

## 2022-04-08 ENCOUNTER — Inpatient Hospital Stay: Payer: Medicare Other | Admitting: Family

## 2022-04-08 VITALS — BP 112/61 | HR 72 | Temp 98.6°F | Resp 17 | Wt 156.1 lb

## 2022-04-08 DIAGNOSIS — Z1231 Encounter for screening mammogram for malignant neoplasm of breast: Secondary | ICD-10-CM

## 2022-04-08 DIAGNOSIS — D509 Iron deficiency anemia, unspecified: Secondary | ICD-10-CM | POA: Diagnosis not present

## 2022-04-08 LAB — CBC WITH DIFFERENTIAL (CANCER CENTER ONLY)
Abs Immature Granulocytes: 0.03 10*3/uL (ref 0.00–0.07)
Basophils Absolute: 0 10*3/uL (ref 0.0–0.1)
Basophils Relative: 1 %
Eosinophils Absolute: 0.1 10*3/uL (ref 0.0–0.5)
Eosinophils Relative: 2 %
HCT: 36.2 % (ref 36.0–46.0)
Hemoglobin: 11.3 g/dL — ABNORMAL LOW (ref 12.0–15.0)
Immature Granulocytes: 0 %
Lymphocytes Relative: 22 %
Lymphs Abs: 1.9 10*3/uL (ref 0.7–4.0)
MCH: 28.1 pg (ref 26.0–34.0)
MCHC: 31.2 g/dL (ref 30.0–36.0)
MCV: 90 fL (ref 80.0–100.0)
Monocytes Absolute: 0.9 10*3/uL (ref 0.1–1.0)
Monocytes Relative: 10 %
Neutro Abs: 5.9 10*3/uL (ref 1.7–7.7)
Neutrophils Relative %: 65 %
Platelet Count: 529 10*3/uL — ABNORMAL HIGH (ref 150–400)
RBC: 4.02 MIL/uL (ref 3.87–5.11)
RDW: 14.2 % (ref 11.5–15.5)
WBC Count: 8.8 10*3/uL (ref 4.0–10.5)
nRBC: 0 % (ref 0.0–0.2)

## 2022-04-08 LAB — FERRITIN: Ferritin: 1151 ng/mL — ABNORMAL HIGH (ref 11–307)

## 2022-04-08 LAB — IRON AND IRON BINDING CAPACITY (CC-WL,HP ONLY)
Iron: 36 ug/dL (ref 28–170)
Saturation Ratios: 15 % (ref 10.4–31.8)
TIBC: 238 ug/dL — ABNORMAL LOW (ref 250–450)
UIBC: 202 ug/dL (ref 148–442)

## 2022-04-08 LAB — RETICULOCYTES
Immature Retic Fract: 12.3 % (ref 2.3–15.9)
RBC.: 3.98 MIL/uL (ref 3.87–5.11)
Retic Count, Absolute: 85.6 10*3/uL (ref 19.0–186.0)
Retic Ct Pct: 2.2 % (ref 0.4–3.1)

## 2022-04-08 NOTE — Progress Notes (Signed)
Hematology and Oncology Follow Up Visit  [name] [id number] March 02, 1951 71 y.o. 04/08/2022   Principle Diagnosis:  Iron deficiency anemia    Current Therapy:        IV iron as indicated   Interim History:  [name] is here today with her husband for follow-up. She is doing well but does note increased fatigue.  She has not noted any blood loss. No bruising or petechiae.  No fever, chills, n/v, cough, rash, dizziness, SOB, chest pain, palpitations, abdominal pain or changes in bowel or bladder habits.  No swelling, tenderness, numbness or tingling in her extremities. No falls or syncope.  No falls or syncope reported.  Appetite is good but she admits that she needs to better hydrate throughout the day. Weight is stable at 156 lbs.   ECOG Performance Status: 1 - Symptomatic but completely ambulatory  Medications:  Allergies as of 04/08/2022   No Known Allergies      Medication List        Accurate as of April 08, 2022  9:56 AM. If you have any questions, ask your nurse or doctor.          ALPRAZolam 0.5 MG tablet Commonly known as: XANAX TAKE ONE TABLET BY MOUTH TWICE A DAY AS NEEDED FOR ANXIETY   cyanocobalamin 500 MCG tablet Commonly known as: VITAMIN B12 Take 500 mcg by mouth daily.   donepezil 10 MG tablet Commonly known as: Aricept Take 1 tablet (10 mg total) by mouth at bedtime.   ferrous sulfate 325 (65 FE) MG tablet   folic acid 1 MG tablet Commonly known as: FOLVITE Take 1 tablet by mouth daily.   leflunomide 20 MG tablet Commonly known as: ARAVA Take 20 mg by mouth daily.   levothyroxine 50 MCG tablet Commonly known as: SYNTHROID Take 1 tablet (50 mcg total) by mouth daily.   lisinopril 10 MG tablet Commonly known as: ZESTRIL TAKE ONE TABLET BY MOUTH DAILY   meloxicam 15 MG tablet Commonly known as: MOBIC Take 1 tablet (15 mg total) by mouth daily.   methotrexate 2.5 MG tablet Commonly known as: RHEUMATREX Take 2.5 mg by  mouth once a week. Caution:Chemotherapy. Protect from light.   Multi Vitamin Tabs 1 tablet   multivitamin with minerals tablet Take 1 tablet by mouth daily.   Omega-3 1000 MG Caps Take by mouth daily.   rosuvastatin 20 MG tablet Commonly known as: Crestor Take 1 tablet (20 mg total) by mouth daily.   sertraline 100 MG tablet Commonly known as: ZOLOFT TAKE ONE TABLET BY MOUTH DAILY   triamterene-hydrochlorothiazide 37.5-25 MG tablet Commonly known as: MAXZIDE-25 TAKE ONE TABLET BY MOUTH DAILY   Vitamin D 1000 units capsule Take 1,000 Units by mouth daily.   vitamin E 180 MG (400 UNITS) capsule Take 400 Units by mouth daily.        Allergies: No Known Allergies  Past Medical History, Surgical history, Social history, and Family History were reviewed and updated.  Review of Systems: All other 10 point review of systems is negative.   Physical Exam:  vitals were not taken for this visit.   Wt Readings from Last 3 Encounters:  03/27/22 158 lb (71.7 kg)  03/07/22 161 lb (73 kg)  12/12/21 159 lb 6.4 oz (72.3 kg)    Ocular: Sclerae unicteric, pupils equal, round and reactive to light Ear-nose-throat: Oropharynx clear, dentition fair Lymphatic: No cervical or supraclavicular adenopathy Lungs no rales or rhonchi, good excursion bilaterally Heart regular  rate and rhythm, no murmur appreciated Abd soft, nontender, positive bowel sounds MSK no focal spinal tenderness, no joint edema Neuro: non-focal, well-oriented, appropriate affect Breasts: Deferred   Lab Results  Component Value Date   WBC 7.5 03/07/2022   HGB 11.2 (L) 03/07/2022   HCT 35.9 (L) 03/07/2022   MCV 91.1 03/07/2022   PLT 445 (H) 03/07/2022   Lab Results  Component Value Date   FERRITIN 762 (H) 12/12/2021   IRON 17 (L) 12/12/2021   TIBC 228 (L) 12/12/2021   UIBC 211 12/12/2021   IRONPCTSAT 7 (L) 12/12/2021   Lab Results  Component Value Date   RETICCTPCT 2.3 12/12/2021   RBC 3.94  03/07/2022   RETICCTABS 65,110 05/01/2020   No results found for: "KPAFRELGTCHN", "LAMBDASER", "KAPLAMBRATIO" No results found for: "IGGSERUM", "IGA", "IGMSERUM" Lab Results  Component Value Date   ALBUMINELP 3.7 (L) 08/25/2019   A1GS 0.4 (H) 08/25/2019   A2GS 0.8 08/25/2019   BETS 0.5 08/25/2019   BETA2SER 0.6 (H) 08/25/2019   GAMS 2.0 (H) 08/25/2019   SPEI  08/25/2019     Comment:     . One or more serum protein fractions are outside the normal ranges.  No abnormal protein bands are apparent. .      Chemistry      Component Value Date/Time   NA 136 03/07/2022 1654   K 3.8 03/07/2022 1654   CL 100 03/07/2022 1654   CO2 23 03/07/2022 1654   BUN 20 03/07/2022 1654   CREATININE 0.72 03/07/2022 1654   CREATININE 0.65 11/25/2021 1210      Component Value Date/Time   CALCIUM 9.8 03/07/2022 1654   ALKPHOS 113 08/07/2020 1256   AST 14 11/25/2021 1210   AST 15 08/07/2020 1256   ALT 11 11/25/2021 1210   ALT 18 08/07/2020 1256   BILITOT 0.5 11/25/2021 1210   BILITOT 0.4 08/07/2020 1256       Impression and Plan: [name] is a very pleasant 71 yo African American female with iron deficiency anemia.   Iron studies are pending.  Follow-up in 6 months.   Eileen Stanford, NP 12/19/20239:56 AM

## 2022-04-09 ENCOUNTER — Inpatient Hospital Stay: Payer: Medicare Other

## 2022-04-09 VITALS — BP 132/69 | HR 74 | Temp 98.2°F | Resp 18

## 2022-04-09 DIAGNOSIS — D509 Iron deficiency anemia, unspecified: Secondary | ICD-10-CM | POA: Diagnosis not present

## 2022-04-09 MED ORDER — SODIUM CHLORIDE 0.9 % IV SOLN
510.0000 mg | Freq: Once | INTRAVENOUS | Status: AC
  Administered 2022-04-09: 510 mg via INTRAVENOUS
  Filled 2022-04-09: qty 510

## 2022-04-09 MED ORDER — SODIUM CHLORIDE 0.9 % IV SOLN: Freq: Once | INTRAVENOUS | Status: AC

## 2022-04-09 NOTE — Patient Instructions (Signed)

## 2022-04-16 ENCOUNTER — Inpatient Hospital Stay: Payer: Medicare Other

## 2022-04-16 VITALS — BP 144/62 | HR 69 | Temp 98.5°F | Resp 18

## 2022-04-16 DIAGNOSIS — D509 Iron deficiency anemia, unspecified: Secondary | ICD-10-CM | POA: Diagnosis not present

## 2022-04-16 MED ORDER — SODIUM CHLORIDE 0.9 % IV SOLN
510.0000 mg | Freq: Once | INTRAVENOUS | Status: AC
  Administered 2022-04-16: 510 mg via INTRAVENOUS
  Filled 2022-04-16: qty 17

## 2022-04-16 MED ORDER — SODIUM CHLORIDE 0.9 % IV SOLN: Freq: Once | INTRAVENOUS | Status: AC

## 2022-04-16 NOTE — Patient Instructions (Signed)
Iron Deficiency Anemia, Adult  Iron deficiency anemia is when you do not have enough red blood cells or hemoglobin in your blood. This happens because you have too little iron in your body. Hemoglobin carries oxygen to parts of the body. Anemia can cause your body to not get enough oxygen. What are the causes? Not eating enough foods that have iron in them. The body not being able to take in iron well. Blood loss. What increases the risk? Having menstrual periods. Being pregnant. What are the signs or symptoms? Pale skin, lips, and nails. Weakness, dizziness, and getting tired easily. Feeling like you cannot breathe well when moving (shortness of breath). Cold hands and feet. Mild anemia may not cause any symptoms. How is this treated? This condition is treated by finding out why you do not have enough iron and then getting more iron. It may include: Adding foods to your diet that have a lot of iron. Taking iron pills (supplements). If you are pregnant or breastfeeding, you may need to take extra iron. Your diet often does not provide the amount of iron that you need. Getting more vitamin C in your diet. Vitamin C helps your body take in iron. You may need to take iron pills with a glass of orange juice or vitamin C pills. Medicines to make heavy menstrual periods lighter. Surgery or testing procedures to find what is causing the condition. You may need blood tests to see if treatment is working. If the treatment does not seem to be working, you may need more tests. Follow these instructions at home: Medicines Take over-the-counter and prescription medicines only as told by your doctor. This includes iron pills and vitamins. Taking them as told is important because too much iron can be harmful. Take iron pills when your stomach is empty. If you cannot handle this, take them with food. Do not drink milk or take antacids at the same time as your iron pills. Iron pills may turn your poop  (stool)black. If you cannot handle taking iron pills by mouth, ask your doctor about getting iron through: An IV tube. A shot (injection) into a muscle. Eating and drinking Talk with your doctor before changing the foods you eat. Your doctor may tell you to eat foods that have a lot of iron, such as: Liver. Low-fat (lean) beef. Breads and cereals that have iron added to them. Eggs. Dried fruit. Dark green, leafy vegetables. Eat fresh fruits and vegetables that are high in vitamin C. They help your body use iron. Foods with a lot of vitamin C include: Oranges. Peppers. Tomatoes. Mangoes. Managing constipation If you are taking iron pills, they may cause trouble pooping (constipation). To prevent or treat this, you may need to: Drink enough fluid to keep your pee (urine) pale yellow. Take over-the-counter or prescription medicines. Eat foods that are high in fiber. These include beans, whole grains, and fresh fruits and vegetables. Limit foods that are high in fat and sugar. These include fried or sweet foods. General instructions Return to your normal activities when your doctor says that it is safe. Keep all follow-up visits. Contact a doctor if: You feel like you may vomit (nauseous), or you vomit. You feel weak. You get light-headed when getting up from sitting or lying down. You are sweating for no reason. You have trouble pooping. You have worse breathing with physical activity. You have heaviness in your chest. Get help right away if: You faint. If this happens, do not drive yourself   to the hospital. You have a fast heartbeat, or a heartbeat that does not feel regular. Summary Iron deficiency anemia happens when you have too little iron in your body. This condition is treated by finding out why you do not have enough iron in your body and then getting more iron. Take over-the-counter and prescription medicines only as told by your doctor. Eat fresh fruits and vegetables  that are high in vitamin C. Contact a doctor if you have trouble pooping or feel weak. This information is not intended to replace advice given to you by your health care provider. Make sure you discuss any questions you have with your health care provider. Document Revised: 05/16/2021 Document Reviewed: 05/16/2021 Elsevier Patient Education  2023 [employer]  

## 2022-04-17 DIAGNOSIS — M0579 Rheumatoid arthritis with rheumatoid factor of multiple sites without organ or systems involvement: Secondary | ICD-10-CM | POA: Diagnosis not present

## 2022-04-17 DIAGNOSIS — Z79899 Other long term (current) drug therapy: Secondary | ICD-10-CM | POA: Diagnosis not present

## 2022-05-21 ENCOUNTER — Other Ambulatory Visit: Payer: Self-pay | Admitting: Internal Medicine

## 2022-05-27 ENCOUNTER — Other Ambulatory Visit: Payer: Medicare Other

## 2022-05-27 DIAGNOSIS — E7800 Pure hypercholesterolemia, unspecified: Secondary | ICD-10-CM

## 2022-05-27 DIAGNOSIS — R7302 Impaired glucose tolerance (oral): Secondary | ICD-10-CM

## 2022-05-27 DIAGNOSIS — E039 Hypothyroidism, unspecified: Secondary | ICD-10-CM | POA: Diagnosis not present

## 2022-05-27 NOTE — Progress Notes (Deleted)
Patient Care Team: Elby Showers, MD as PCP - General (Internal Medicine)  Visit Date: 05/29/22  Subjective:    Patient ID: [name] , Female   DOB: 01-May-1950, 72 y.o.    MRN: [id number]   72 y.o. Female presents today for a 6 month follow-up. Patient has a past medical history of  hyperlipidemia, hypothyroidism, hypertension, impaired glucose tolerance, moderate sleep apnea but is not interested in CPAP, and memory loss.  She also has a history of Rheumatoid arthritis  and has seen  by Dr. Trudie Reed.  Has been on methotrexate for rheumatoid arthritis.  Now on Simponi. Also has been prescribed Mobic 15 mg daily.  This was noticed in consultation August 2021.  Her CCP was greater than 250.  Reports feeling generally well.  History of hypertension treated with lisinopril 10 mg daily and Maxzide 25 daily  She has a history of hyperlipidemia: CHOL elevated at 251, LDL elevated at 176 on 05/27/22. She will consider a coronary calcium scoring test. She and her husband decline statin med for her at this time.  History of iron deficiency anemia in the remote past. Ferritin was elevated at 1151 in December which is likely an acute phase reactant as she is not taking iron supplement at this time.  History of dementia. WAIS-IV score of 71 in 2021.has seen Dr. Sima Matas for neuropsychological testing.  Husband has been concerned that statin medication may have led to memory loss.  History of depression treated with Zoloft 100 mg daily.  Denies abdominal pain, chest pain, shortness of breath, knee pain, GI problems.  She is walking regularly during the evening. She is pleasant and cooperative.  History of hypothyroidism treated with levothyroxine 50 mcg daily.  Has been prescribed Xanax 0.5 mg to take twice daily as needed for anxiety.   Past Medical History:  Diagnosis Date   Anemia    Hyperlipidemia    Hypertension    Pre-diabetes    Sleep apnea    Sleep apnea      Family  History  Problem Relation Age of Onset   Diabetes Mother    Hypertension Mother    Kidney disease Father    Heart disease Father    Stroke Sister    Sleep apnea Neg Hx     SHx: married with adult children who live out of town. Husband is a retired Chief Financial Officer.     Review of Systems  Constitutional:  Negative for chills, fever, malaise/fatigue and weight loss.  HENT:  Negative for hearing loss, sinus pain and sore throat.   Respiratory:  Negative for cough and hemoptysis.   Cardiovascular:  Negative for chest pain, palpitations, leg swelling and PND.  Gastrointestinal:  Negative for abdominal pain, constipation, diarrhea, heartburn, nausea and vomiting.  Genitourinary:  Negative for dysuria, frequency and urgency.  Musculoskeletal:  Negative for back pain, myalgias and neck pain.  Skin:  Negative for itching and rash.  Neurological:  Negative for dizziness, tingling, seizures and headaches.  Endo/Heme/Allergies:  Negative for polydipsia.  Psychiatric/Behavioral:  Negative for depression. The patient is not nervous/anxious.         Objective:   Vitals: BP 120/76   Pulse 70   Temp 97.9 F (36.6 C) (Tympanic)   Ht 5' 2"$  (1.575 m)   Wt 158 lb 6.4 oz (71.8 kg)   SpO2 99%   BMI 28.97 kg/m    Physical Exam Vitals and nursing note reviewed. Exam conducted with a chaperone present.  Constitutional:      General: She is not in acute distress.    Appearance: Normal appearance. She is not ill-appearing or toxic-appearing.  HENT:     Head: Normocephalic and atraumatic.     Comments: No weakness in face muscles.    Right Ear: Hearing, tympanic membrane, ear canal and external ear normal.     Left Ear: Hearing, tympanic membrane, ear canal and external ear normal.     Mouth/Throat:     Pharynx: Oropharynx is clear.  Eyes:     Extraocular Movements: Extraocular movements intact.     Pupils: Pupils are equal, round, and reactive to light.  Neck:     Thyroid: No thyroid mass,  thyromegaly or thyroid tenderness.     Vascular: No carotid bruit.  Cardiovascular:     Rate and Rhythm: Normal rate and regular rhythm. No extrasystoles are present.    Pulses: Normal pulses.          Dorsalis pedis pulses are 2+ on the right side and 2+ on the left side.       Posterior tibial pulses are 2+ on the right side and 2+ on the left side.     Heart sounds: Murmur heard.     Systolic murmur is present with a grade of 1/6.     No friction rub. No gallop.  Pulmonary:     Effort: Pulmonary effort is normal.     Breath sounds: Normal breath sounds. No decreased breath sounds, wheezing, rhonchi or rales.  Chest:     Chest wall: No mass.  Abdominal:     Palpations: Abdomen is soft. There is no hepatomegaly, splenomegaly or mass.     Tenderness: There is no abdominal tenderness.     Hernia: No hernia is present.  Musculoskeletal:     Cervical back: Normal range of motion.     Right lower leg: No edema.     Left lower leg: No edema.     Comments: 5/5 muscular strength.  Lymphadenopathy:     Cervical: No cervical adenopathy.     Upper Body:     Right upper body: No supraclavicular adenopathy.     Left upper body: No supraclavicular adenopathy.  Skin:    General: Skin is warm and dry.  Neurological:     General: No focal deficit present.     Mental Status: She is alert and oriented to person, place, and time. Mental status is at baseline.     Sensory: Sensation is intact.     Motor: Motor function is intact. No weakness.     Deep Tendon Reflexes: Reflexes are normal and symmetric.  Psychiatric:        Attention and Perception: Attention normal.        Mood and Affect: Mood normal.        Speech: Speech normal.        Behavior: Behavior normal.        Thought Content: Thought content normal.        Cognition and Memory: Cognition normal. Memory is impaired.        Judgment: Judgment normal.     Comments: Not oriented to day of week, time, month, year, president.        Results:   Studies obtained and personally reviewed by me:    Labs:       Component Value Date/Time   NA 136 03/07/2022 1654   K 3.8 03/07/2022 1654   CL 100 03/07/2022  1654   CO2 23 03/07/2022 1654   GLUCOSE 111 (H) 03/07/2022 1654   BUN 20 03/07/2022 1654   CREATININE 0.72 03/07/2022 1654   CREATININE 0.65 11/25/2021 1210   CALCIUM 9.8 03/07/2022 1654   PROT 7.7 05/27/2022 0956   ALBUMIN 3.9 08/07/2020 1256   AST 20 05/27/2022 0956   AST 15 08/07/2020 1256   ALT 13 05/27/2022 0956   ALT 18 08/07/2020 1256   ALKPHOS 113 08/07/2020 1256   BILITOT 0.6 05/27/2022 0956   BILITOT 0.4 08/07/2020 1256   GFRNONAA >60 03/07/2022 1654   GFRNONAA >60 08/07/2020 1256   GFRNONAA 90 11/17/2019 1116   GFRAA 105 11/17/2019 1116     Lab Results  Component Value Date   WBC 8.8 04/08/2022   HGB 11.3 (L) 04/08/2022   HCT 36.2 04/08/2022   MCV 90.0 04/08/2022   PLT 529 (H) 04/08/2022    Lab Results  Component Value Date   CHOL 251 (H) 05/27/2022   HDL 58 05/27/2022   LDLCALC 176 (H) 05/27/2022   TRIG 71 05/27/2022   CHOLHDL 4.3 05/27/2022    Lab Results  Component Value Date   HGBA1C 5.1 05/27/2022     Lab Results  Component Value Date   TSH 2.30 05/27/2022      Assessment & Plan:   Hypertension: Well-controlled with Zestril 10 mg daily. BP 120/76 today.  High Cholesterol: CHOL elevated at 251, LDL elevated at 176 on 05/27/22. She will consider a coronary  calcium scoring test.  Health Maintenance: She is walking regularly during the evening. Taking vacations regularly. No longer drives  Vaccine Counseling: Declined Covid-19, flu, pneumococcal 20 vaccines.  Dementia: she has stopped taking Aricept. Last Neurology visit 2021. WAIS-IV score at 71 in 2021. Husband and family aware of memory loss. Prefer conservative treatment at this time. She is pleasant and cooperative but confused about day of week, month, year  Rheumatoid Arthritis- seen at [facility]  Rheumatology.  I,Alexander Ruley,acting as a Education administrator for Elby Showers, MD.,have documented all relevant documentation on the behalf of Elby Showers, MD,as directed by  Elby Showers, MD while in the presence of Elby Showers, MD.  I, Elby Showers, MD, have reviewed all documentation for this visit. The documentation on 05/29/22 for the exam, diagnosis, procedures, and orders are all accurate and complete.

## 2022-05-27 NOTE — Progress Notes (Incomplete Revision)
Patient Care Team: Elby Showers, MD as PCP - General (Internal Medicine)  Visit Date: 05/29/22  Subjective:    Patient ID: [name] , Female   DOB: 01-May-1950, 72 y.o.    MRN: [id number]   72 y.o. Female presents today for a 6 month follow-up. Patient has a past medical history of  hyperlipidemia, hypothyroidism, hypertension, impaired glucose tolerance, moderate sleep apnea but is not interested in CPAP, and memory loss.  She also has a history of Rheumatoid arthritis  and has seen  by Dr. Trudie Reed.  Has been on methotrexate for rheumatoid arthritis.  Now on Simponi. Also has been prescribed Mobic 15 mg daily.  This was noticed in consultation August 2021.  Her CCP was greater than 250.  Reports feeling generally well.  History of hypertension treated with lisinopril 10 mg daily and Maxzide 25 daily  She has a history of hyperlipidemia: CHOL elevated at 251, LDL elevated at 176 on 05/27/22. She will consider a coronary calcium scoring test. She and her husband decline statin med for her at this time.  History of iron deficiency anemia in the remote past. Ferritin was elevated at 1151 in December which is likely an acute phase reactant as she is not taking iron supplement at this time.  History of dementia. WAIS-IV score of 71 in 2021.has seen Dr. Sima Matas for neuropsychological testing.  Husband has been concerned that statin medication may have led to memory loss.  History of depression treated with Zoloft 100 mg daily.  Denies abdominal pain, chest pain, shortness of breath, knee pain, GI problems.  She is walking regularly during the evening. She is pleasant and cooperative.  History of hypothyroidism treated with levothyroxine 50 mcg daily.  Has been prescribed Xanax 0.5 mg to take twice daily as needed for anxiety.   Past Medical History:  Diagnosis Date   Anemia    Hyperlipidemia    Hypertension    Pre-diabetes    Sleep apnea    Sleep apnea      Family  History  Problem Relation Age of Onset   Diabetes Mother    Hypertension Mother    Kidney disease Father    Heart disease Father    Stroke Sister    Sleep apnea Neg Hx     SHx: married with adult children who live out of town. Husband is a retired Chief Financial Officer.     Review of Systems  Constitutional:  Negative for chills, fever, malaise/fatigue and weight loss.  HENT:  Negative for hearing loss, sinus pain and sore throat.   Respiratory:  Negative for cough and hemoptysis.   Cardiovascular:  Negative for chest pain, palpitations, leg swelling and PND.  Gastrointestinal:  Negative for abdominal pain, constipation, diarrhea, heartburn, nausea and vomiting.  Genitourinary:  Negative for dysuria, frequency and urgency.  Musculoskeletal:  Negative for back pain, myalgias and neck pain.  Skin:  Negative for itching and rash.  Neurological:  Negative for dizziness, tingling, seizures and headaches.  Endo/Heme/Allergies:  Negative for polydipsia.  Psychiatric/Behavioral:  Negative for depression. The patient is not nervous/anxious.         Objective:   Vitals: BP 120/76   Pulse 70   Temp 97.9 F (36.6 C) (Tympanic)   Ht 5' 2"$  (1.575 m)   Wt 158 lb 6.4 oz (71.8 kg)   SpO2 99%   BMI 28.97 kg/m    Physical Exam Vitals and nursing note reviewed. Exam conducted with a chaperone present.  Constitutional:      General: She is not in acute distress.    Appearance: Normal appearance. She is not ill-appearing or toxic-appearing.  HENT:     Head: Normocephalic and atraumatic.     Comments: No weakness in face muscles.    Right Ear: Hearing, tympanic membrane, ear canal and external ear normal.     Left Ear: Hearing, tympanic membrane, ear canal and external ear normal.     Mouth/Throat:     Pharynx: Oropharynx is clear.  Eyes:     Extraocular Movements: Extraocular movements intact.     Pupils: Pupils are equal, round, and reactive to light.  Neck:     Thyroid: No thyroid mass,  thyromegaly or thyroid tenderness.     Vascular: No carotid bruit.  Cardiovascular:     Rate and Rhythm: Normal rate and regular rhythm. No extrasystoles are present.    Pulses: Normal pulses.          Dorsalis pedis pulses are 2+ on the right side and 2+ on the left side.       Posterior tibial pulses are 2+ on the right side and 2+ on the left side.     Heart sounds: Murmur heard.     Systolic murmur is present with a grade of 1/6.     No friction rub. No gallop.  Pulmonary:     Effort: Pulmonary effort is normal.     Breath sounds: Normal breath sounds. No decreased breath sounds, wheezing, rhonchi or rales.  Chest:     Chest wall: No mass.  Abdominal:     Palpations: Abdomen is soft. There is no hepatomegaly, splenomegaly or mass.     Tenderness: There is no abdominal tenderness.     Hernia: No hernia is present.  Musculoskeletal:     Cervical back: Normal range of motion.     Right lower leg: No edema.     Left lower leg: No edema.     Comments: 5/5 muscular strength.  Lymphadenopathy:     Cervical: No cervical adenopathy.     Upper Body:     Right upper body: No supraclavicular adenopathy.     Left upper body: No supraclavicular adenopathy.  Skin:    General: Skin is warm and dry.  Neurological:     General: No focal deficit present.     Mental Status: She is alert and oriented to person, place, and time. Mental status is at baseline.     Sensory: Sensation is intact.     Motor: Motor function is intact. No weakness.     Deep Tendon Reflexes: Reflexes are normal and symmetric.  Psychiatric:        Attention and Perception: Attention normal.        Mood and Affect: Mood normal.        Speech: Speech normal.        Behavior: Behavior normal.        Thought Content: Thought content normal.        Cognition and Memory: Cognition normal. Memory is impaired.        Judgment: Judgment normal.     Comments: Not oriented to day of week, time, month, year, president.        Results:   Studies obtained and personally reviewed by me:    Labs:       Component Value Date/Time   NA 136 03/07/2022 1654   K 3.8 03/07/2022 1654   CL 100 03/07/2022  1654   CO2 23 03/07/2022 1654   GLUCOSE 111 (H) 03/07/2022 1654   BUN 20 03/07/2022 1654   CREATININE 0.72 03/07/2022 1654   CREATININE 0.65 11/25/2021 1210   CALCIUM 9.8 03/07/2022 1654   PROT 7.7 05/27/2022 0956   ALBUMIN 3.9 08/07/2020 1256   AST 20 05/27/2022 0956   AST 15 08/07/2020 1256   ALT 13 05/27/2022 0956   ALT 18 08/07/2020 1256   ALKPHOS 113 08/07/2020 1256   BILITOT 0.6 05/27/2022 0956   BILITOT 0.4 08/07/2020 1256   GFRNONAA >60 03/07/2022 1654   GFRNONAA >60 08/07/2020 1256   GFRNONAA 90 11/17/2019 1116   GFRAA 105 11/17/2019 1116     Lab Results  Component Value Date   WBC 8.8 04/08/2022   HGB 11.3 (L) 04/08/2022   HCT 36.2 04/08/2022   MCV 90.0 04/08/2022   PLT 529 (H) 04/08/2022    Lab Results  Component Value Date   CHOL 251 (H) 05/27/2022   HDL 58 05/27/2022   LDLCALC 176 (H) 05/27/2022   TRIG 71 05/27/2022   CHOLHDL 4.3 05/27/2022    Lab Results  Component Value Date   HGBA1C 5.1 05/27/2022     Lab Results  Component Value Date   TSH 2.30 05/27/2022      Assessment & Plan:   Hypertension: Well-controlled with Zestril 10 mg daily. BP 120/76 today.  High Cholesterol: CHOL elevated at 251, LDL elevated at 176 on 05/27/22. She will consider a coronary  calcium scoring test.  Health Maintenance: She is walking regularly during the evening. Taking vacations regularly. No longer drives  Vaccine Counseling: Declined Covid-19, flu, pneumococcal 20 vaccines.  Dementia: she has stopped taking Aricept. Last Neurology visit 2021. WAIS-IV score at 71 in 2021. Husband and family aware of memory loss. Prefer conservative treatment at this time. She is pleasant and cooperative but confused about day of week, month, year  Rheumatoid Arthritis- seen at [facility]  Rheumatology.  I,Alexander Ruley,acting as a Education administrator for Elby Showers, MD.,have documented all relevant documentation on the behalf of Elby Showers, MD,as directed by  Elby Showers, MD while in the presence of Elby Showers, MD.  I, Elby Showers, MD, have reviewed all documentation for this visit. The documentation on 05/29/22 for the exam, diagnosis, procedures, and orders are all accurate and complete.

## 2022-05-28 LAB — HEPATIC FUNCTION PANEL
AG Ratio: 0.9 (calc) — ABNORMAL LOW (ref 1.0–2.5)
ALT: 13 U/L (ref 6–29)
AST: 20 U/L (ref 10–35)
Albumin: 3.6 g/dL (ref 3.6–5.1)
Alkaline phosphatase (APISO): 101 U/L (ref 37–153)
Bilirubin, Direct: 0.1 mg/dL (ref 0.0–0.2)
Globulin: 4.1 g/dL (calc) — ABNORMAL HIGH (ref 1.9–3.7)
Indirect Bilirubin: 0.5 mg/dL (calc) (ref 0.2–1.2)
Total Bilirubin: 0.6 mg/dL (ref 0.2–1.2)
Total Protein: 7.7 g/dL (ref 6.1–8.1)

## 2022-05-28 LAB — LIPID PANEL
Cholesterol: 251 mg/dL — ABNORMAL HIGH (ref <200)
HDL: 58 mg/dL (ref >=50)
LDL Cholesterol (Calc): 176 mg/dL (calc) — ABNORMAL HIGH
Non-HDL Cholesterol (Calc): 193 mg/dL (calc) — ABNORMAL HIGH (ref <130)
Total CHOL/HDL Ratio: 4.3 (calc) (ref <5.0)
Triglycerides: 71 mg/dL (ref <150)

## 2022-05-28 LAB — HEMOGLOBIN A1C
Hgb A1c MFr Bld: 5.1 % of total Hgb (ref <5.7)
Mean Plasma Glucose: 100 mg/dL
eAG (mmol/L): 5.5 mmol/L

## 2022-05-28 LAB — TSH: TSH: 2.3 mIU/L (ref 0.40–4.50)

## 2022-05-29 ENCOUNTER — Encounter: Payer: Self-pay | Admitting: Internal Medicine

## 2022-05-29 ENCOUNTER — Ambulatory Visit (INDEPENDENT_AMBULATORY_CARE_PROVIDER_SITE_OTHER): Payer: Medicare Other | Admitting: Internal Medicine

## 2022-05-29 VITALS — BP 120/76 | HR 70 | Temp 97.9°F | Ht 62.0 in | Wt 158.4 lb

## 2022-05-29 DIAGNOSIS — E7800 Pure hypercholesterolemia, unspecified: Secondary | ICD-10-CM

## 2022-05-29 DIAGNOSIS — Z8639 Personal history of other endocrine, nutritional and metabolic disease: Secondary | ICD-10-CM | POA: Diagnosis not present

## 2022-05-29 DIAGNOSIS — G4733 Obstructive sleep apnea (adult) (pediatric): Secondary | ICD-10-CM | POA: Diagnosis not present

## 2022-05-29 DIAGNOSIS — E039 Hypothyroidism, unspecified: Secondary | ICD-10-CM

## 2022-05-29 DIAGNOSIS — I10 Essential (primary) hypertension: Secondary | ICD-10-CM

## 2022-05-29 DIAGNOSIS — R011 Cardiac murmur, unspecified: Secondary | ICD-10-CM | POA: Diagnosis not present

## 2022-05-29 DIAGNOSIS — Z8659 Personal history of other mental and behavioral disorders: Secondary | ICD-10-CM

## 2022-05-29 NOTE — Patient Instructions (Addendum)
It was a pleasure to see you today. Vaccines discussed but declined at this time. She has significant hyperlipidemia but she and husband decline lipid med for her at this time. She is pleasant and cooperative. RTC in 6 months or as needed.

## 2022-05-30 NOTE — Progress Notes (Addendum)
Patient Care Team: Elby Showers, MD as PCP - General (Internal Medicine)  Visit Date: 05/30/22  Subjective:    Patient ID: [name] , Female   DOB: Feb 16, 1951, 72 y.o.    MRN: [id number]   72 y.o. Female presents today for a 6 month follow-up. Patient has a past medical history of  hyperlipidemia, hypothyroidism, hypertension, impaired glucose tolerance, moderate sleep apnea but is not interested in CPAP, and memory loss.  She also has a history of Rheumatoid arthritis  and has seen  by Dr. Trudie Reed.  Has been on methotrexate for rheumatoid arthritis.  Now on Simponi. Also has been prescribed Mobic 15 mg daily.  This was noticed in consultation August 2021.  Her CCP was greater than 250.  Reports feeling generally well.  History of hypertension treated with lisinopril 10 mg daily and Maxzide 25 daily  She has a history of hyperlipidemia: CHOL elevated at 251, LDL elevated at 176 on 05/27/22. She will consider a coronary calcium scoring test. She and her husband decline statin med for her at this time.  History of iron deficiency anemia in the remote past. Ferritin was elevated at 1151 in December which is likely an acute phase reactant as she is not taking iron supplement at this time.  History of dementia. WAIS-IV score of 71 in 2021.has seen Dr. Sima Matas for neuropsychological testing.  Husband has been concerned that statin medication may have led to memory loss.  History of depression treated with Zoloft 100 mg daily.  Denies abdominal pain, chest pain, shortness of breath, knee pain, GI problems.  She is walking regularly during the evening. She is pleasant and cooperative.  History of hypothyroidism treated with levothyroxine 50 mcg daily.  Has been prescribed Xanax 0.5 mg to take twice daily as needed for anxiety.   Past Medical History:  Diagnosis Date   Anemia    Hyperlipidemia    Hypertension    Pre-diabetes    Sleep apnea    Sleep apnea      Family  History  Problem Relation Age of Onset   Diabetes Mother    Hypertension Mother    Kidney disease Father    Heart disease Father    Stroke Sister    Sleep apnea Neg Hx     SHx: married with adult children who live out of town. Husband is a retired Chief Financial Officer.     Review of Systems  Constitutional:  Negative for chills, fever, malaise/fatigue and weight loss.  HENT:  Negative for hearing loss, sinus pain and sore throat.   Respiratory:  Negative for cough and hemoptysis.   Cardiovascular:  Negative for chest pain, palpitations, leg swelling and PND.  Gastrointestinal:  Negative for abdominal pain, constipation, diarrhea, heartburn, nausea and vomiting.  Genitourinary:  Negative for dysuria, frequency and urgency.  Musculoskeletal:  Negative for back pain, myalgias and neck pain.  Skin:  Negative for itching and rash.  Neurological:  Negative for dizziness, tingling, seizures and headaches.  Endo/Heme/Allergies:  Negative for polydipsia.  Psychiatric/Behavioral:  Negative for depression. The patient is not nervous/anxious.         Objective:   Vitals: BP 120/76   Pulse 70   Temp 97.9 F (36.6 C) (Tympanic)   Ht 5' 2"$  (1.575 m)   Wt 158 lb 6.4 oz (71.8 kg)   SpO2 99%   BMI 28.97 kg/m    Physical Exam Vitals and nursing note reviewed. Exam conducted with a chaperone present.  Constitutional:      General: She is not in acute distress.    Appearance: Normal appearance. She is not ill-appearing or toxic-appearing.  HENT:     Head: Normocephalic and atraumatic.     Comments: No weakness in face muscles.    Right Ear: Hearing, tympanic membrane, ear canal and external ear normal.     Left Ear: Hearing, tympanic membrane, ear canal and external ear normal.     Mouth/Throat:     Pharynx: Oropharynx is clear.  Eyes:     Extraocular Movements: Extraocular movements intact.     Pupils: Pupils are equal, round, and reactive to light.  Neck:     Thyroid: No thyroid mass,  thyromegaly or thyroid tenderness.     Vascular: No carotid bruit.  Cardiovascular:     Rate and Rhythm: Normal rate and regular rhythm. No extrasystoles are present.    Pulses: Normal pulses.          Dorsalis pedis pulses are 2+ on the right side and 2+ on the left side.       Posterior tibial pulses are 2+ on the right side and 2+ on the left side.     Heart sounds: Murmur heard.     Systolic murmur is present with a grade of 1/6.     No friction rub. No gallop.  Pulmonary:     Effort: Pulmonary effort is normal.     Breath sounds: Normal breath sounds. No decreased breath sounds, wheezing, rhonchi or rales.  Chest:     Chest wall: No mass.  Abdominal:     Palpations: Abdomen is soft. There is no hepatomegaly, splenomegaly or mass.     Tenderness: There is no abdominal tenderness.     Hernia: No hernia is present.  Musculoskeletal:     Cervical back: Normal range of motion.     Right lower leg: No edema.     Left lower leg: No edema.     Comments: 5/5 muscular strength.  Lymphadenopathy:     Cervical: No cervical adenopathy.     Upper Body:     Right upper body: No supraclavicular adenopathy.     Left upper body: No supraclavicular adenopathy.  Skin:    General: Skin is warm and dry.  Neurological:     General: No focal deficit present.     Mental Status: She is alert and oriented to person, place, and time. Mental status is at baseline.     Sensory: Sensation is intact.     Motor: Motor function is intact. No weakness.     Deep Tendon Reflexes: Reflexes are normal and symmetric.  Psychiatric:        Attention and Perception: Attention normal.        Mood and Affect: Mood normal.        Speech: Speech normal.        Behavior: Behavior normal.        Thought Content: Thought content normal.        Cognition and Memory: Cognition normal. Memory is impaired.        Judgment: Judgment normal.     Comments: Not oriented to day of week, time, month, year, president.        Results:   Studies obtained and personally reviewed by me:    Labs:       Component Value Date/Time   NA 136 03/07/2022 1654   K 3.8 03/07/2022 1654   CL 100 03/07/2022  1654   CO2 23 03/07/2022 1654   GLUCOSE 111 (H) 03/07/2022 1654   BUN 20 03/07/2022 1654   CREATININE 0.72 03/07/2022 1654   CREATININE 0.65 11/25/2021 1210   CALCIUM 9.8 03/07/2022 1654   PROT 7.7 05/27/2022 0956   ALBUMIN 3.9 08/07/2020 1256   AST 20 05/27/2022 0956   AST 15 08/07/2020 1256   ALT 13 05/27/2022 0956   ALT 18 08/07/2020 1256   ALKPHOS 113 08/07/2020 1256   BILITOT 0.6 05/27/2022 0956   BILITOT 0.4 08/07/2020 1256   GFRNONAA >60 03/07/2022 1654   GFRNONAA >60 08/07/2020 1256   GFRNONAA 90 11/17/2019 1116   GFRAA 105 11/17/2019 1116     Lab Results  Component Value Date   WBC 8.8 04/08/2022   HGB 11.3 (L) 04/08/2022   HCT 36.2 04/08/2022   MCV 90.0 04/08/2022   PLT 529 (H) 04/08/2022    Lab Results  Component Value Date   CHOL 251 (H) 05/27/2022   HDL 58 05/27/2022   LDLCALC 176 (H) 05/27/2022   TRIG 71 05/27/2022   CHOLHDL 4.3 05/27/2022    Lab Results  Component Value Date   HGBA1C 5.1 05/27/2022     Lab Results  Component Value Date   TSH 2.30 05/27/2022      Assessment & Plan:   Hypertension: Well-controlled with Zestril 10 mg daily. BP 120/76 today.  High Cholesterol: CHOL elevated at 251, LDL elevated at 176 on 05/27/22. She will consider a coronary  calcium scoring test.  Health Maintenance: She is walking regularly during the evening. Taking vacations regularly. No longer drives  Vaccine Counseling: Declined Covid-19, flu, pneumococcal 20 vaccines.  Dementia: she has stopped taking Aricept. Last Neurology visit 2021. WAIS-IV score at 71 in 2021. Husband and family aware of memory loss. Prefer conservative treatment at this time. She is pleasant and cooperative but confused about day of week, month, year  Rheumatoid Arthritis- seen at [facility]  Rheumatology.  I,Alexander Ruley,acting as a Education administrator for Elby Showers, MD.,have documented all relevant documentation on the behalf of Elby Showers, MD,as directed by  Elby Showers, MD while in the presence of Elby Showers, MD.  I, Elby Showers, MD, have reviewed all documentation for this visit. The documentation on 05/30/22 for the exam, diagnosis, procedures, and orders are all accurate and complete.

## 2022-06-05 ENCOUNTER — Ambulatory Visit
Admission: RE | Admit: 2022-06-05 | Discharge: 2022-06-05 | Disposition: A | Discharge status: Home / Self Care | Payer: Medicare Other | Source: Ambulatory Visit | Attending: Internal Medicine | Admitting: Internal Medicine

## 2022-06-05 ENCOUNTER — Encounter: Payer: Self-pay | Admitting: Family

## 2022-06-05 DIAGNOSIS — Z1231 Encounter for screening mammogram for malignant neoplasm of breast: Secondary | ICD-10-CM

## 2022-06-12 DIAGNOSIS — Z79899 Other long term (current) drug therapy: Secondary | ICD-10-CM | POA: Diagnosis not present

## 2022-06-12 DIAGNOSIS — M0579 Rheumatoid arthritis with rheumatoid factor of multiple sites without organ or systems involvement: Secondary | ICD-10-CM | POA: Diagnosis not present

## 2022-06-17 ENCOUNTER — Other Ambulatory Visit: Payer: Self-pay | Admitting: Internal Medicine

## 2022-06-26 ENCOUNTER — Encounter (HOSPITAL_COMMUNITY): Payer: Medicare Other

## 2022-07-14 DIAGNOSIS — R7989 Other specified abnormal findings of blood chemistry: Secondary | ICD-10-CM | POA: Diagnosis not present

## 2022-07-22 DIAGNOSIS — M0579 Rheumatoid arthritis with rheumatoid factor of multiple sites without organ or systems involvement: Secondary | ICD-10-CM | POA: Diagnosis not present

## 2022-07-22 DIAGNOSIS — M1991 Primary osteoarthritis, unspecified site: Secondary | ICD-10-CM | POA: Diagnosis not present

## 2022-07-22 DIAGNOSIS — R5382 Chronic fatigue, unspecified: Secondary | ICD-10-CM | POA: Diagnosis not present

## 2022-07-24 ENCOUNTER — Ambulatory Visit: Payer: Self-pay | Admitting: *Deleted

## 2022-07-24 NOTE — Patient Outreach (Signed)
  Care Coordination   Follow Up and Case Closure Visit Note   07/24/2022 Name: [name] MRN: [id number] DOB: 1951-03-13  [name] is a 72 y.o. year old female who sees Baxley, Cresenciano Lick, MD for primary care. I spoke with  Claybon Jabs' husband  by phone today.  What matters to the patients health and wellness today?  Maintaining function and safety.    Goals Addressed             This Visit's Progress    COMPLETED: Husband's concern is to help his wife maintain her memory and provide appropriate care for her.       Interventions Today    Flowsheet Row Most Recent Value  Chronic Disease   Chronic disease during today's visit Other  General Interventions   General Interventions Discussed/Reviewed General Interventions Discussed, General Interventions Reviewed  [Pt continues to NOT take Aricept. Husband notes slight decline in her memory but she is functional and pleasant. Note pt just saw Dr. Renold Genta. Pt lipid panel warrants a statin being prescribed but pt/husband decline this intervention. No other problems.]              SDOH assessments and interventions completed:  Yes    Previously assessed.  Care Coordination Interventions:  Yes, provided   Follow up plan: No further intervention required. Pt husband advised of case closure. No further needs.  Encounter Outcome:  Pt. Visit Completed   Kayleen Memos C. Myrtie Neither, MSN, [facility] Gerontological Nurse Practitioner [facility] Care Management [phone]

## 2022-07-27 ENCOUNTER — Other Ambulatory Visit: Payer: Self-pay | Admitting: Internal Medicine

## 2022-08-07 DIAGNOSIS — Z79899 Other long term (current) drug therapy: Secondary | ICD-10-CM | POA: Diagnosis not present

## 2022-08-07 DIAGNOSIS — M0579 Rheumatoid arthritis with rheumatoid factor of multiple sites without organ or systems involvement: Secondary | ICD-10-CM | POA: Diagnosis not present

## 2022-09-15 ENCOUNTER — Telehealth: Payer: Self-pay | Admitting: Pulmonary Disease

## 2022-09-15 NOTE — Telephone Encounter (Signed)
Pt. Husband still wants wife to have pet scan if it can get approved with insurance if we can pleas ecall them back made them apt. To f/u with Dr. In July

## 2022-09-17 NOTE — Telephone Encounter (Signed)
I got PET rescheduled to 6/28 at 10:40, arrive 10:10, npo 6 hrs prior.  Tried to call pt's husband back & phone just rings.

## 2022-09-17 NOTE — Telephone Encounter (Signed)
Called cell # and was able to leave a vm and ask for call back regarding PET appt.

## 2022-09-17 NOTE — Telephone Encounter (Signed)
Once this has been rescheduled, an authorization will be obtained.  Any PCC can reschedule this PET.

## 2022-09-18 NOTE — Telephone Encounter (Signed)
Spoke to pt's husband and gave him new appt info.  Told him our office will try to get prior auth now that it has been rescheduled.

## 2022-10-02 ENCOUNTER — Other Ambulatory Visit (HOSPITAL_COMMUNITY): Payer: Medicare Other

## 2022-10-06 DIAGNOSIS — M0579 Rheumatoid arthritis with rheumatoid factor of multiple sites without organ or systems involvement: Secondary | ICD-10-CM | POA: Diagnosis not present

## 2022-10-06 DIAGNOSIS — R5383 Other fatigue: Secondary | ICD-10-CM | POA: Diagnosis not present

## 2022-10-06 DIAGNOSIS — Z79899 Other long term (current) drug therapy: Secondary | ICD-10-CM | POA: Diagnosis not present

## 2022-10-08 ENCOUNTER — Inpatient Hospital Stay: Payer: Medicare Other | Attending: Hematology & Oncology

## 2022-10-08 ENCOUNTER — Encounter: Payer: Self-pay | Admitting: Family

## 2022-10-08 ENCOUNTER — Inpatient Hospital Stay (HOSPITAL_BASED_OUTPATIENT_CLINIC_OR_DEPARTMENT_OTHER): Payer: Medicare Other | Admitting: Family

## 2022-10-08 VITALS — BP 139/65 | HR 51 | Temp 98.1°F | Resp 17 | Wt 161.0 lb

## 2022-10-08 DIAGNOSIS — D509 Iron deficiency anemia, unspecified: Secondary | ICD-10-CM | POA: Diagnosis not present

## 2022-10-08 LAB — RETICULOCYTES
Immature Retic Fract: 16.1 % — ABNORMAL HIGH (ref 2.3–15.9)
RBC.: 3.75 MIL/uL — ABNORMAL LOW (ref 3.87–5.11)
Retic Count, Absolute: 92.3 10*3/uL (ref 19.0–186.0)
Retic Ct Pct: 2.5 % (ref 0.4–3.1)

## 2022-10-08 LAB — CBC WITH DIFFERENTIAL (CANCER CENTER ONLY)
Abs Immature Granulocytes: 0.01 10*3/uL (ref 0.00–0.07)
Basophils Absolute: 0 10*3/uL (ref 0.0–0.1)
Basophils Relative: 1 %
Eosinophils Absolute: 0.1 10*3/uL (ref 0.0–0.5)
Eosinophils Relative: 2 %
HCT: 36.1 % (ref 36.0–46.0)
Hemoglobin: 11.5 g/dL — ABNORMAL LOW (ref 12.0–15.0)
Immature Granulocytes: 0 %
Lymphocytes Relative: 50 %
Lymphs Abs: 3.2 10*3/uL (ref 0.7–4.0)
MCH: 30.3 pg (ref 26.0–34.0)
MCHC: 31.9 g/dL (ref 30.0–36.0)
MCV: 95 fL (ref 80.0–100.0)
Monocytes Absolute: 0.5 10*3/uL (ref 0.1–1.0)
Monocytes Relative: 8 %
Neutro Abs: 2.5 10*3/uL (ref 1.7–7.7)
Neutrophils Relative %: 39 %
Platelet Count: 371 10*3/uL (ref 150–400)
RBC: 3.8 MIL/uL — ABNORMAL LOW (ref 3.87–5.11)
RDW: 13.2 % (ref 11.5–15.5)
WBC Count: 6.3 10*3/uL (ref 4.0–10.5)
nRBC: 0 % (ref 0.0–0.2)

## 2022-10-08 LAB — IRON AND IRON BINDING CAPACITY (CC-WL,HP ONLY)
Iron: 125 ug/dL (ref 28–170)
Saturation Ratios: 54 % — ABNORMAL HIGH (ref 10.4–31.8)
TIBC: 231 ug/dL — ABNORMAL LOW (ref 250–450)
UIBC: 106 ug/dL — ABNORMAL LOW (ref 148–442)

## 2022-10-08 LAB — FERRITIN: Ferritin: 1212 ng/mL — ABNORMAL HIGH (ref 11–307)

## 2022-10-08 NOTE — Progress Notes (Signed)
Hematology and Oncology Follow Up Visit  [name] [id number] [birth year] 72 y.o. 10/08/2022   Principle Diagnosis:  Iron deficiency anemia    Current Therapy:        IV iron as indicated   Interim History:  [name] is here today for follow-up. She is doing well and has no complaints at this time.  She notes occasional fatigue but is resting well at night.  No blood loss noted. No abnormal bruising, no petechiae.  No fever, chills, n/v, cough, rash, dizziness, SOB, chest pain, palpitations, abdominal pain or changes in bowel or bladder habits.  No swelling, tenderness, numbness or tingling in her extremities at this time.  No falls or syncope reported.  Appetite and hydration are good. Weight is stable at 161 lbs.  She is enjoying walking for exercise.   ECOG Performance Status: 1 - Symptomatic but completely ambulatory  Medications:  Allergies as of 10/08/2022   No Known Allergies      Medication List        Accurate as of October 08, 2022 10:59 AM. If you have any questions, ask your nurse or doctor.          ALPRAZolam 0.5 MG tablet Commonly known as: XANAX TAKE ONE TABLET BY MOUTH TWICE A DAY AS NEEDED FOR ANXIETY   donepezil 10 MG tablet Commonly known as: Aricept Take 1 tablet (10 mg total) by mouth at bedtime.   ferrous sulfate 325 (65 FE) MG tablet   folic acid 1 MG tablet Commonly known as: FOLVITE Take 1 tablet by mouth daily.   leflunomide 20 MG tablet Commonly known as: ARAVA Take 20 mg by mouth daily.   levothyroxine 50 MCG tablet Commonly known as: SYNTHROID Take 1 tablet (50 mcg total) by mouth daily.   lisinopril 10 MG tablet Commonly known as: ZESTRIL TAKE ONE TABLET BY MOUTH DAILY   meloxicam 15 MG tablet Commonly known as: MOBIC Take 1 tablet (15 mg total) by mouth daily.   methotrexate 2.5 MG tablet Commonly known as: RHEUMATREX Take 2.5 mg by mouth once a week. Caution:Chemotherapy. Protect from light.   Multi Vitamin  Tabs 1 tablet   multivitamin with minerals tablet Take 1 tablet by mouth daily.   Omega-3 1000 MG Caps Take by mouth daily.   rosuvastatin 20 MG tablet Commonly known as: Crestor Take 1 tablet (20 mg total) by mouth daily.   sertraline 100 MG tablet Commonly known as: ZOLOFT TAKE ONE TABLET BY MOUTH DAILY   triamterene-hydrochlorothiazide 37.5-25 MG tablet Commonly known as: MAXZIDE-25 TAKE 1 TABLET BY MOUTH DAILY        Allergies: No Known Allergies  Past Medical History, Surgical history, Social history, and Family History were reviewed and updated.  Review of Systems: All other 10 point review of systems is negative.   Physical Exam:  weight is 161 lb (73 kg). Her oral temperature is 98.1 F (36.7 C). Her blood pressure is 139/65 and her pulse is 51 (abnormal). Her respiration is 17 and oxygen saturation is 100%.   Wt Readings from Last 3 Encounters:  10/08/22 161 lb (73 kg)  05/29/22 158 lb 6.4 oz (71.8 kg)  04/08/22 156 lb 1.9 oz (70.8 kg)    Ocular: Sclerae unicteric, pupils equal, round and reactive to light Ear-nose-throat: Oropharynx clear, dentition fair Lymphatic: No cervical or supraclavicular adenopathy Lungs no rales or rhonchi, good excursion bilaterally Heart regular rate and rhythm, no murmur appreciated Abd soft, nontender, positive bowel sounds MSK  no focal spinal tenderness, no joint edema Neuro: non-focal, well-oriented, appropriate affect Breasts: Deferred   Lab Results  Component Value Date   WBC 6.3 10/08/2022   HGB 11.5 (L) 10/08/2022   HCT 36.1 10/08/2022   MCV 95.0 10/08/2022   PLT 371 10/08/2022   Lab Results  Component Value Date   FERRITIN 1,151 (H) 04/08/2022   IRON 36 04/08/2022   TIBC 238 (L) 04/08/2022   UIBC 202 04/08/2022   IRONPCTSAT 15 04/08/2022   Lab Results  Component Value Date   RETICCTPCT 2.5 10/08/2022   RBC 3.75 (L) 10/08/2022   RBC 3.80 (L) 10/08/2022   RETICCTABS 65,110 05/01/2020   No results  found for: "KPAFRELGTCHN", "LAMBDASER", "KAPLAMBRATIO" No results found for: "IGGSERUM", "IGA", "IGMSERUM" Lab Results  Component Value Date   ALBUMINELP 3.7 (L) 08/25/2019   A1GS 0.4 (H) 08/25/2019   A2GS 0.8 08/25/2019   BETS 0.5 08/25/2019   BETA2SER 0.6 (H) 08/25/2019   GAMS 2.0 (H) 08/25/2019   SPEI  08/25/2019     Comment:     . One or more serum protein fractions are outside the normal ranges.  No abnormal protein bands are apparent. .      Chemistry      Component Value Date/Time   NA 136 03/07/2022 1654   K 3.8 03/07/2022 1654   CL 100 03/07/2022 1654   CO2 23 03/07/2022 1654   BUN 20 03/07/2022 1654   CREATININE 0.72 03/07/2022 1654   CREATININE 0.65 11/25/2021 1210      Component Value Date/Time   CALCIUM 9.8 03/07/2022 1654   ALKPHOS 113 08/07/2020 1256   AST 20 05/27/2022 0956   AST 15 08/07/2020 1256   ALT 13 05/27/2022 0956   ALT 18 08/07/2020 1256   BILITOT 0.6 05/27/2022 0956   BILITOT 0.4 08/07/2020 1256       Impression and Plan: [name] is a very pleasant 72 yo African American female with iron deficiency anemia.   Iron studies are pending.  Follow-up in 6 months.   Eileen Stanford, NP 6/19/202410:59 AM

## 2022-10-17 ENCOUNTER — Encounter (HOSPITAL_COMMUNITY)
Admission: RE | Admit: 2022-10-17 | Discharge: 2022-10-17 | Disposition: A | Discharge status: Home / Self Care | Payer: Medicare Other | Source: Ambulatory Visit | Attending: Pulmonary Disease | Admitting: Pulmonary Disease

## 2022-10-17 DIAGNOSIS — I700 Atherosclerosis of aorta: Secondary | ICD-10-CM | POA: Diagnosis not present

## 2022-10-17 DIAGNOSIS — R9389 Abnormal findings on diagnostic imaging of other specified body structures: Secondary | ICD-10-CM | POA: Insufficient documentation

## 2022-10-17 DIAGNOSIS — K8020 Calculus of gallbladder without cholecystitis without obstruction: Secondary | ICD-10-CM | POA: Diagnosis not present

## 2022-10-17 DIAGNOSIS — R590 Localized enlarged lymph nodes: Secondary | ICD-10-CM | POA: Diagnosis not present

## 2022-10-17 DIAGNOSIS — I2510 Atherosclerotic heart disease of native coronary artery without angina pectoris: Secondary | ICD-10-CM | POA: Diagnosis not present

## 2022-10-17 LAB — GLUCOSE, CAPILLARY: Glucose-Capillary: 91 mg/dL (ref 70–99)

## 2022-10-17 MED ORDER — FLUDEOXYGLUCOSE F - 18 (FDG) INJECTION
8.0100 | Freq: Once | INTRAVENOUS | Status: AC
  Administered 2022-10-17: 8.01 via INTRAVENOUS

## 2022-10-28 DIAGNOSIS — M0579 Rheumatoid arthritis with rheumatoid factor of multiple sites without organ or systems involvement: Secondary | ICD-10-CM | POA: Diagnosis not present

## 2022-10-28 DIAGNOSIS — R5382 Chronic fatigue, unspecified: Secondary | ICD-10-CM | POA: Diagnosis not present

## 2022-10-28 DIAGNOSIS — M1991 Primary osteoarthritis, unspecified site: Secondary | ICD-10-CM | POA: Diagnosis not present

## 2022-10-29 ENCOUNTER — Encounter: Payer: Self-pay | Admitting: Pulmonary Disease

## 2022-10-29 ENCOUNTER — Ambulatory Visit: Payer: Medicare Other | Admitting: Pulmonary Disease

## 2022-10-29 VITALS — BP 124/64 | HR 66 | Temp 98.2°F | Ht 62.0 in | Wt 163.8 lb

## 2022-10-29 DIAGNOSIS — R590 Localized enlarged lymph nodes: Secondary | ICD-10-CM | POA: Diagnosis not present

## 2022-10-29 NOTE — Progress Notes (Signed)
[name]    [id number]    09/02/1950  Primary Care Physician:Baxley, Luanna Cole, MD  Referring Physician: Margaree Mackintosh, MD [address] [place],  [place] [address]  Chief complaint: Follow-up for lymphadenopathy  HPI: 72 y.o. who  has a past medical history of Anemia, Hyperlipidemia, Hypertension, Pre-diabetes, Sleep apnea, and Sleep apnea.,  Rheumatoid arthritis  Patient was seen in the emergency room on 03/07/2022 with atypical chest pain.  Troponins x 2 was negative and EKG without acute ischemic changes.  She obtain CT angiogram as D-dimer was elevated which did not show any blood clot.  There was a notation of axillary lymphadenopathy and has been referred here for further evaluation  History notable for rheumatoid arthritis.  She follows with [facility] rheumatology and had been on methotrexate until 2022 which was stopped and treatment changed to Arava and leflunomide due to persistent joint symptoms.  She states that her arthritis is under better control now  Pets: Used to have a dog Occupation: Works for Clinical biochemist in [employer] Exposures: No mold, hot tub, Jacuzzi.  No feather pillows or comforters Smoking history: Never smoker Travel history: No significant travel history Relevant family history: No family history of lung disease.  Interim history: Here for follow-up of PET scan States that she has no new symptoms.  Outpatient Encounter Medications as of 10/29/2022  Medication Sig   ALPRAZolam (XANAX) 0.5 MG tablet TAKE ONE TABLET BY MOUTH TWICE A DAY AS NEEDED FOR ANXIETY   leflunomide (ARAVA) 20 MG tablet Take 20 mg by mouth daily.   lisinopril (ZESTRIL) 10 MG tablet TAKE ONE TABLET BY MOUTH DAILY   Multiple Vitamin (MULTI VITAMIN) TABS 1 tablet   Multiple Vitamins-Minerals (MULTIVITAMIN WITH MINERALS) tablet Take 1 tablet by mouth daily.   Omega-3 1000 MG CAPS Take by mouth daily.   sertraline (ZOLOFT) 100 MG tablet TAKE ONE TABLET BY  MOUTH DAILY   triamterene-hydrochlorothiazide (MAXZIDE-25) 37.5-25 MG tablet TAKE 1 TABLET BY MOUTH DAILY   donepezil (ARICEPT) 10 MG tablet Take 1 tablet (10 mg total) by mouth at bedtime. (Patient not taking: Reported on 05/29/2022)   ferrous sulfate 325 (65 FE) MG tablet  (Patient not taking: Reported on 05/29/2022)   folic acid (FOLVITE) 1 MG tablet Take 1 tablet by mouth daily. (Patient not taking: Reported on 05/29/2022)   levothyroxine (SYNTHROID) 50 MCG tablet Take 1 tablet (50 mcg total) by mouth daily. (Patient not taking: Reported on 05/29/2022)   meloxicam (MOBIC) 15 MG tablet Take 1 tablet (15 mg total) by mouth daily. (Patient not taking: Reported on 11/26/2021)   methotrexate (RHEUMATREX) 2.5 MG tablet Take 2.5 mg by mouth once a week. Caution:Chemotherapy. Protect from light. (Patient not taking: Reported on 11/26/2021)   rosuvastatin (CRESTOR) 20 MG tablet Take 1 tablet (20 mg total) by mouth daily. (Patient not taking: Reported on 11/26/2021)   No facility-administered encounter medications on file as of 10/29/2022.    Physical Exam: Blood pressure 124/64, pulse 66, temperature 98.2 F (36.8 C), temperature source Oral, height 5\' 2"  (1.575 m), weight 163 lb 12.8 oz (74.3 kg), SpO2 96 %. Gen:      No acute distress HEENT:  EOMI, sclera anicteric Neck:     No masses; no thyromegaly Lungs:    Clear to auscultation bilaterally; normal respiratory effort and no CV:         Regular rate and rhythm; no murmurs Abd:      +  bowel sounds; soft, non-tender; no palpable masses, no distension Ext:    No edema; adequate peripheral perfusion Skin:      Warm and dry; no rash Neuro: alert and oriented x 3 Psych: normal mood and affect   Data Reviewed: Imaging: CTA 03/08/2022-no pulmonary embolism, bibasilar atelectasis, enlarged axillary lymph nodes.  No mediastinal or hilar lymphadenopathy.  PET scan 10/17/2022 Borderline axillary lymph nodes are slightly decreased in size.  There is no uptake in  hilar lymph nodes Uptake noted in shoulder joint, thyroid, nasopharynx and cecum  I have reviewed the images personally.  PFTs:  Labs:  Assessment:  Lymphadenopathy I had reviewed the CT scan and although it states hilar lymph node enlargement in final read she only has bilateral axillary lymphadenopathy which appears nonspecific, reactive in nature  Follow-up PET scan reviewed which does not show any significant uptake in lymph nodes.  Will order follow-up CT in 1 year to make sure we are okay  In addition she has uptake in other areas and will need referral to ENT for nasopharyngeal examination, referral to GI for colonoscopy as she is not up-to-date with screening and order thyroid ultrasound for uptake in the thyroid  Plan/Recommendations: Follow-up CT in 1 year Referral to GI Referral to ENT Thyroid ultrasound  Chilton Greathouse MD [facility] 10/29/2022, 10:00 AM  CC: Margaree Mackintosh, MD

## 2022-10-29 NOTE — Patient Instructions (Addendum)
Based on the PET scan findings the lymph nodes look okay.  Will order a follow-up CT chest with contrast in 1 year for this Due to activity in other areas on the PET scan will make referral to ENT for abnormal PET scan Referral to GI for colonoscopy, abnormal PET scan and order thyroid ultrasound  Follow-up in 6 months

## 2022-11-10 ENCOUNTER — Ambulatory Visit
Admission: RE | Admit: 2022-11-10 | Discharge: 2022-11-10 | Disposition: A | Discharge status: Home / Self Care | Payer: Medicare Other | Source: Ambulatory Visit | Attending: Pulmonary Disease | Admitting: Pulmonary Disease

## 2022-11-10 DIAGNOSIS — R590 Localized enlarged lymph nodes: Secondary | ICD-10-CM

## 2022-11-10 DIAGNOSIS — E042 Nontoxic multinodular goiter: Secondary | ICD-10-CM | POA: Diagnosis not present

## 2022-11-21 ENCOUNTER — Other Ambulatory Visit: Payer: Medicare Other

## 2022-11-21 DIAGNOSIS — Z0000 Encounter for general adult medical examination without abnormal findings: Secondary | ICD-10-CM

## 2022-11-21 LAB — CBC WITH DIFFERENTIAL/PLATELET
Absolute Monocytes: 763 cells/uL (ref 200–950)
Basophils Absolute: 31 cells/uL (ref 0–200)
Basophils Relative: 0.5 %
Eosinophils Absolute: 140 cells/uL (ref 15–500)
Eosinophils Relative: 2.3 %
HCT: 38.1 % (ref 35.0–45.0)
Hemoglobin: 12.4 g/dL (ref 11.7–15.5)
Lymphs Abs: 2544 cells/uL (ref 850–3900)
MCH: 30 pg (ref 27.0–33.0)
MCHC: 32.5 g/dL (ref 32.0–36.0)
MCV: 92.3 fL (ref 80.0–100.0)
MPV: 11.7 fL (ref 7.5–12.5)
Monocytes Relative: 12.5 %
Neutro Abs: 2623 cells/uL (ref 1500–7800)
Neutrophils Relative %: 43 %
Platelets: 292 10*3/uL (ref 140–400)
RBC: 4.13 10*6/uL (ref 3.80–5.10)
RDW: 12 % (ref 11.0–15.0)
Total Lymphocyte: 41.7 %
WBC: 6.1 10*3/uL (ref 3.8–10.8)

## 2022-11-28 NOTE — Progress Notes (Signed)
Annual Wellness Visit    Patient Care Team: Margaree Mackintosh, MD as PCP - General (Internal Medicine)  Visit Date: 12/04/22   Chief Complaint  Patient presents with   Medicare Wellness   Annual Exam    Subjective:   Patient: [name], Female    DOB: [birth year], 72 y.o.   MRN: [id number]  [name] is a 72 y.o. Female who presents today for her Annual Wellness Visit. History of anemia, hyperlipidemia, hypertension, impaired glucose tolerance, sleep apnea, anxiety and depression, memory loss.  She is staying active and sleeping well. Appetite is normal.  History of hyperlipidemia and not taking medication. CHOL elevated at 315 on 11/21/22, up from 251 on 05/27/22. LDL elevated at 228 on 11/21/22, up from 176 on 05/27/22. She is agreeable to restarting rosuvastatin 10 mg daily.  History of hypertension treated with lisinopril 10 mg daily, triamterene-hydrochlorothiazide 37.5-25 mg daily. Blood pressure normal today at 100/70.  History of anxiety and depression treated with alprazolam 0.5 mg twice daily as needed, sertraline 100 mg daily.  11/10/22 US thyroid showed the following: 1) Findings suggestive of multinodular goiter, 2) Nodule labeled #1 likely correlates with the hypermetabolic nodule demonstrated on preceding PET-CT and as such could undergo ultrasound-guided fine-needle aspiration as indicated, 3) 1.9 cm nodule meets imaging criteria to recommend a 1 year follow-up.  She has a longstanding history of iron deficiency. Not taking iron supplement.  Seen at Hematology for iron deficiency.  Ferritin is 1,229 on 11/21/22 and is an acute phase reactant and is always high in this patient.  This is likely due to rheumatoid arthritis.  Patient does have a history of memory issues.   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   Was initially diagnosed with mild cognitive impairment by Dr. Leonides Cave, neuropsychologist in December 2016 when she started on Zoloft at that  time for depression.  She had previous MRI of the brain with and without contrast showing no acute findings or abnormal lesions.  Had mild periventricular and subcortical foci of nonspecific gliosis.   Followed by Dr. Nickola Major, rheumatologist, for joint pain and arthralgias and has positive ANA and CCP.  ANA titer was 1: 320 and pattern was nuclear/speckled.  CCP was greater than 250 and rheumatoid factor was positive.  Rheumatoid arthritis was diagnosed. She is now taking leflunomide 20 mg daily.   Had allergy testing in 2007 by Dr. Burns Callas which was negative.  She saw ENT physician at that time for chronic sinusitis and was thought to have GE reflux.  CT of the sinuses was normal.  Remote history of iron deficiency anemia related to menorrhagia but that resolved with menopause.   Ganglion cyst removed from left hand 1999.  Bilateral tubal ligation 1993.  Thrombosed hemorrhoid excised by Dr. Derrell Lolling in 2005.  Had an episode of chest pain in 11/23. No heart abnormalities found.   Followed by pulmonologist, Dr. Isaiah Serge, for bilateral axillary lymphadenopathy. PET scan 10/17/2022 does not show any significant uptake in lymph nodes. Will have follow-up CT in 2025.  Blood work done on 11/21/22. TSH at 3.44. CBC normal. HGBA1c at 5.2%.  Mammogram last completed 06/06/22. No mammographic evidence of malignancy. Recommended repeat in 2025.  She had colonoscopy in 2011 and negative Cologuard in 2021.  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  Husband is retired from [employer]. She has 3 adult children, 2 daughters and a son.  Husband is  very supportive.  She has no behavioral issues.  She sleeps well.  Visits with neighbors.  Does not wander.  She does get confused at times.   Family history: Father with history of hypertension died of renal failure and congestive heart failure.  He also had a history of subdural hematoma.  He was 71 years old when he died.  Mother died  at age 83 of diabetes and hypertension.  1 brother with history of hypertension on dialysis.  2 sisters-1 of whom is living with history of colon cancer.  Younger sister died in 1995-01-17 of cerebral hemorrhage.  Past Medical History:  Diagnosis Date   Anemia    Hyperlipidemia    Hypertension    Pre-diabetes    Sleep apnea    Sleep apnea      Family History  Problem Relation Age of Onset   Diabetes Mother    Hypertension Mother    Kidney disease Father    Heart disease Father    Stroke Sister    Sleep apnea Neg Hx      Social Hx: Married. Has adult children. Formerly worked for [employer]. Husband is a retired Art gallery manager.    Review of Systems  Constitutional:  Negative for chills, fever, malaise/fatigue and weight loss.  HENT:  Negative for hearing loss, sinus pain and sore throat.   Respiratory:  Negative for cough, hemoptysis and shortness of breath.   Cardiovascular:  Negative for chest pain, palpitations, leg swelling and PND.  Gastrointestinal:  Negative for abdominal pain, constipation, diarrhea, heartburn, nausea and vomiting.  Genitourinary:  Negative for dysuria, frequency and urgency.  Musculoskeletal:  Negative for back pain, myalgias and neck pain.  Skin:  Negative for itching and rash.  Neurological:  Negative for dizziness, tingling, seizures and headaches.  Endo/Heme/Allergies:  Negative for polydipsia.  Psychiatric/Behavioral:  Negative for depression. The patient is not nervous/anxious.       Objective:   Vitals: BP 100/70   Pulse (!) 55   Resp (!) 96   Ht 5\' 2"  (1.575 m)   Wt 164 lb (74.4 kg)   BMI 30.00 kg/m   Physical Exam Vitals and nursing note reviewed.  Constitutional:      General: She is not in acute distress.    Appearance: Normal appearance. She is not ill-appearing or toxic-appearing.  HENT:     Head: Normocephalic and atraumatic.     Right Ear: Hearing, tympanic membrane, ear canal and external ear normal.     Left Ear: Hearing,  tympanic membrane, ear canal and external ear normal.     Mouth/Throat:     Pharynx: Oropharynx is clear.  Eyes:     Extraocular Movements: Extraocular movements intact.     Pupils: Pupils are equal, round, and reactive to light.  Neck:     Thyroid: No thyroid mass, thyromegaly or thyroid tenderness.     Vascular: No carotid bruit.  Cardiovascular:     Rate and Rhythm: Normal rate and regular rhythm. No extrasystoles are present.    Pulses:          Dorsalis pedis pulses are 1+ on the right side and 1+ on the left side.       Posterior tibial pulses are 0 on the right side and 0 on the left side.     Heart sounds: Normal heart sounds. No murmur heard.    No friction rub. No gallop.  Pulmonary:     Effort: Pulmonary effort is normal.  Breath sounds: Normal breath sounds. No decreased breath sounds, wheezing, rhonchi or rales.  Chest:     Chest wall: No mass.  Abdominal:     Palpations: Abdomen is soft. There is no hepatomegaly, splenomegaly or mass.     Tenderness: There is no abdominal tenderness.     Hernia: No hernia is present.  Musculoskeletal:     Cervical back: Normal range of motion.     Right lower leg: No edema.     Left lower leg: No edema.  Lymphadenopathy:     Cervical: No cervical adenopathy.     Upper Body:     Right upper body: No supraclavicular adenopathy.     Left upper body: No supraclavicular adenopathy.  Skin:    General: Skin is warm and dry.  Neurological:     General: No focal deficit present.     Mental Status: She is alert and oriented to person, place, and time. Mental status is at baseline.     Sensory: Sensation is intact.     Motor: Motor function is intact. No weakness.     Deep Tendon Reflexes: Reflexes are normal and symmetric.  Psychiatric:        Attention and Perception: Attention normal.        Mood and Affect: Mood normal.        Speech: Speech normal.        Behavior: Behavior normal.        Thought Content: Thought content  normal.        Cognition and Memory: Cognition normal.        Judgment: Judgment normal.      Most recent functional status assessment:    12/04/2022   10:52 AM  In your present state of health, do you have any difficulty performing the following activities:  Hearing? 0  Vision? 0  Difficulty concentrating or making decisions? 0  Walking or climbing stairs? 0  Dressing or bathing? 0  Doing errands, shopping? 1  Preparing Food and eating ? N  Using the Toilet? N  In the past six months, have you accidently leaked urine? N  Do you have problems with loss of bowel control? N  Managing your Medications? Y  Managing your Finances? Y  Housekeeping or managing your Housekeeping? N   Most recent fall risk assessment:    12/04/2022   10:59 AM  Fall Risk   Falls in the past year? 0  Number falls in past yr: 0  Injury with Fall? 0  Risk for fall due to : No Fall Risks  Follow up Falls evaluation completed    Most recent depression screenings:    12/04/2022   10:50 AM 05/29/2022   10:36 AM  PHQ 2/9 Scores  PHQ - 2 Score 2 0  PHQ- 9 Score 4    Most recent cognitive screening:    12/04/2022   10:54 AM  6CIT Screen  What Year? 4 points  What month? 3 points  What time? 3 points  Count back from 20 0 points  Months in reverse 0 points  Repeat phrase 0 points  Total Score 10 points     Results:   Studies obtained and personally reviewed by me:  Mammogram last completed 06/06/22. No mammographic evidence of malignancy. Recommended repeat in 2025.  She had colonoscopy in 2011 and negative Cologuard in 2021.  11/10/22 US thyroid showed the following: 1) Findings suggestive of multinodular goiter, 2) Nodule labeled #1 likely correlates with  the hypermetabolic nodule demonstrated on preceding PET-CT and as such could undergo ultrasound-guided fine-needle aspiration as indicated, 3) 1.9 cm nodule meets imaging criteria to recommend a 1 year follow-up.  Labs:       Component  Value Date/Time   NA 136 03/07/2022 1654   K 3.8 03/07/2022 1654   CL 100 03/07/2022 1654   CO2 23 03/07/2022 1654   GLUCOSE 111 (H) 03/07/2022 1654   BUN 20 03/07/2022 1654   CREATININE 0.72 03/07/2022 1654   CREATININE 0.65 11/25/2021 1210   CALCIUM 9.8 03/07/2022 1654   PROT 7.7 05/27/2022 0956   ALBUMIN 3.9 08/07/2020 1256   AST 20 05/27/2022 0956   AST 15 08/07/2020 1256   ALT 13 05/27/2022 0956   ALT 18 08/07/2020 1256   ALKPHOS 113 08/07/2020 1256   BILITOT 0.6 05/27/2022 0956   BILITOT 0.4 08/07/2020 1256   GFRNONAA >60 03/07/2022 1654   GFRNONAA >60 08/07/2020 1256   GFRNONAA 90 11/17/2019 1116   GFRAA 105 11/17/2019 1116     Lab Results  Component Value Date   WBC 6.1 11/21/2022   HGB 12.4 11/21/2022   HCT 38.1 11/21/2022   MCV 92.3 11/21/2022   PLT 292 11/21/2022    Lab Results  Component Value Date   CHOL 315 (H) 11/21/2022   HDL 65 11/21/2022   LDLCALC 228 (H) 11/21/2022   TRIG 96 11/21/2022   CHOLHDL 4.8 11/21/2022    Lab Results  Component Value Date   HGBA1C 5.2 11/21/2022     Lab Results  Component Value Date   TSH 3.44 11/21/2022    Assessment & Plan:   Hyperlipidemia: start rosuvastatin 10 mg daily. CHOL elevated at 315 on 11/21/22, up from 251 on 05/27/22. LDL elevated at 228 on 11/21/22, up from 176 on 05/27/22.   Hypertension: treated with lisinopril 10 mg daily, triamterene-hydrochlorothiazide 37.5-25 mg daily. Blood pressure normal today at 100/70.  Anxiety and depression: stable with alprazolam 0.5 mg twice daily as needed, sertraline 100 mg daily.  Goiter: 11/10/22 US thyroid showed the following: 1) Findings suggestive of multinodular goiter, 2) Nodule labeled #1 likely correlates with the hypermetabolic nodule demonstrated on preceding PET-CT and as such could undergo ultrasound-guided fine-needle aspiration as indicated, 3) 1.9 cm nodule meets imaging criteria to recommend a 1 year follow-up.  Iron deficiency: Not taking iron  supplement.  Seen at Hematology for iron deficiency.  Ferritin is 1,229 on 11/21/22 and is an acute phase reactant and is always high in this patient.  This is likely due to rheumatoid arthritis.  Memory loss: stable without Aricept. Followed by   Rheumatoid arthritis: multiple sites treated with Simponi amd meloxicam. Followed by Dr. Nickola Major, Rheumatologist.  Mammogram last completed 06/06/22. No mammographic evidence of malignancy. Recommended repeat in 2025.  Bilateral axillary adenopathy seen by Pulmonary. PET scan shows Uptake in other areas and Dr> Mannam, Pulmonary suggests ENT for nasopharyngeal exam, colonoscopy and thyroid ultrasound  She had colonoscopy in 2011 and negative Cologuard in 2021. Ordered repeat Cologuard.  Vaccine counseling: tetanus due 12/2022. Administered pneumococcal 20 vaccine. Advised to get Covid-19 booster in the fall.   Return in 6 months for follow-up. Husband says daughters may want to call me about their mother's health as they reside out of town.     Annual wellness visit done today including the all of the following: Reviewed patient's Family Medical History Reviewed and updated list of patient's medical providers Assessment of cognitive impairment was done Assessed patient's functional  ability Established a written schedule for health screening services Health Risk Assessent Completed and Reviewed  Discussed health benefits of physical activity, and encouraged her to engage in regular exercise appropriate for her age and condition.        I,Alexander Ruley,acting as a Neurosurgeon for Margaree Mackintosh, MD.,have documented all relevant documentation on the behalf of Margaree Mackintosh, MD,as directed by  Margaree Mackintosh, MD while in the presence of Margaree Mackintosh, MD.   I, Margaree Mackintosh, MD, have reviewed all documentation for this visit. The documentation on 12/20/22 for the exam, diagnosis, procedures, and orders are all accurate and complete.

## 2022-12-01 DIAGNOSIS — M0579 Rheumatoid arthritis with rheumatoid factor of multiple sites without organ or systems involvement: Secondary | ICD-10-CM | POA: Diagnosis not present

## 2022-12-04 ENCOUNTER — Encounter: Payer: Self-pay | Admitting: Internal Medicine

## 2022-12-04 ENCOUNTER — Ambulatory Visit: Payer: Medicare Other | Admitting: Internal Medicine

## 2022-12-04 VITALS — BP 100/70 | HR 55 | Resp 96 | Ht 62.0 in | Wt 164.0 lb

## 2022-12-04 DIAGNOSIS — G4733 Obstructive sleep apnea (adult) (pediatric): Secondary | ICD-10-CM

## 2022-12-04 DIAGNOSIS — M0579 Rheumatoid arthritis with rheumatoid factor of multiple sites without organ or systems involvement: Secondary | ICD-10-CM

## 2022-12-04 DIAGNOSIS — E049 Nontoxic goiter, unspecified: Secondary | ICD-10-CM | POA: Diagnosis not present

## 2022-12-04 DIAGNOSIS — Z0000 Encounter for general adult medical examination without abnormal findings: Secondary | ICD-10-CM

## 2022-12-04 DIAGNOSIS — Z1211 Encounter for screening for malignant neoplasm of colon: Secondary | ICD-10-CM | POA: Diagnosis not present

## 2022-12-04 DIAGNOSIS — R9389 Abnormal findings on diagnostic imaging of other specified body structures: Secondary | ICD-10-CM | POA: Diagnosis not present

## 2022-12-04 DIAGNOSIS — R413 Other amnesia: Secondary | ICD-10-CM | POA: Diagnosis not present

## 2022-12-04 DIAGNOSIS — E7800 Pure hypercholesterolemia, unspecified: Secondary | ICD-10-CM

## 2022-12-04 DIAGNOSIS — R011 Cardiac murmur, unspecified: Secondary | ICD-10-CM | POA: Diagnosis not present

## 2022-12-04 DIAGNOSIS — R590 Localized enlarged lymph nodes: Secondary | ICD-10-CM

## 2022-12-04 DIAGNOSIS — Z23 Encounter for immunization: Secondary | ICD-10-CM | POA: Diagnosis not present

## 2022-12-04 DIAGNOSIS — I10 Essential (primary) hypertension: Secondary | ICD-10-CM | POA: Diagnosis not present

## 2022-12-04 DIAGNOSIS — Z8639 Personal history of other endocrine, nutritional and metabolic disease: Secondary | ICD-10-CM

## 2022-12-04 DIAGNOSIS — Z8659 Personal history of other mental and behavioral disorders: Secondary | ICD-10-CM

## 2022-12-04 DIAGNOSIS — E039 Hypothyroidism, unspecified: Secondary | ICD-10-CM | POA: Diagnosis not present

## 2022-12-04 LAB — POCT URINALYSIS DIP (CLINITEK)
Bilirubin, UA: NEGATIVE
Blood, UA: NEGATIVE
Glucose, UA: NEGATIVE mg/dL
Ketones, POC UA: NEGATIVE mg/dL
Leukocytes, UA: NEGATIVE
Nitrite, UA: NEGATIVE
POC PROTEIN,UA: NEGATIVE
Spec Grav, UA: 1.015 (ref 1.010–1.025)
Urobilinogen, UA: 0.2 E.U./dL
pH, UA: 6 (ref 5.0–8.0)

## 2022-12-04 MED ORDER — ROSUVASTATIN CALCIUM 10 MG PO TABS: 10.0000 mg | ORAL_TABLET | Freq: Every day | ORAL | 3 refills | Status: DC

## 2022-12-15 DIAGNOSIS — H2513 Age-related nuclear cataract, bilateral: Secondary | ICD-10-CM | POA: Diagnosis not present

## 2022-12-15 DIAGNOSIS — H524 Presbyopia: Secondary | ICD-10-CM | POA: Diagnosis not present

## 2022-12-20 NOTE — Patient Instructions (Addendum)
It was a pleasure to see you today. Pulmonologist has suggested ENT evaluation due to PET scan findings. Please advise if you want [place] to make a referral. Also has abnormal thyroid nodule which can be evaluated by Endocrinology if so desired. She has axillary adenopathy with negative PET scan. Pulmonologist wants another PET scan study in one year. No change in meds at this time.

## 2022-12-23 DIAGNOSIS — Z1211 Encounter for screening for malignant neoplasm of colon: Secondary | ICD-10-CM | POA: Diagnosis not present

## 2022-12-30 ENCOUNTER — Encounter: Payer: Self-pay | Admitting: Gastroenterology

## 2023-01-02 ENCOUNTER — Telehealth: Payer: Self-pay | Admitting: Internal Medicine

## 2023-01-02 DIAGNOSIS — E041 Nontoxic single thyroid nodule: Secondary | ICD-10-CM

## 2023-01-02 LAB — COLOGUARD: COLOGUARD: NEGATIVE

## 2023-01-02 NOTE — Telephone Encounter (Signed)
Patient's husband notified and provided Dr. Shirlee More office phone number.

## 2023-01-02 NOTE — Telephone Encounter (Signed)
Patient's husband has called the office stating his wife was supposed to complete a thyroid biopsy but there is no order in, please advise    Sunday evening January 04, 2023. The PET scan showed a couple of areas of hypermetabolism in the setting of multinodular goiter. In addition there was some hyperactivity in the colon and she is scheduled to meet with GI about a possible colonoscopy although Cologard was negative. She has memory loss and husband is concerned. He is her main caregiver.Thyroid ultrasound was ordered by Dr. Isaiah Serge. This showed  a 1.2 x 0.7 x 0.7 peripherally calcified ill defined nodule within mid right thyroid which correlates with PET scan findings, Radiology has suggested a biopsy be performed.  A third nodule also identified is to be followed up with thyroid ultrasound in one year.  MJB, MD

## 2023-01-04 ENCOUNTER — Encounter: Payer: Self-pay | Admitting: Internal Medicine

## 2023-01-06 ENCOUNTER — Telehealth: Payer: Self-pay | Admitting: Internal Medicine

## 2023-01-06 NOTE — Telephone Encounter (Signed)
Called and spoke with [name] to confirm he was aware of [name]'s appointment tomorrow for her US Biopsy and he was. I also let him know it will take several days to get the results back from the biopsy, he verbalized understanding.

## 2023-01-07 ENCOUNTER — Other Ambulatory Visit (HOSPITAL_COMMUNITY)
Admission: RE | Admit: 2023-01-07 | Discharge: 2023-01-07 | Disposition: A | Discharge status: Home / Self Care | Payer: Medicare Other | Source: Ambulatory Visit | Attending: Internal Medicine | Admitting: Internal Medicine

## 2023-01-07 ENCOUNTER — Ambulatory Visit
Admission: RE | Admit: 2023-01-07 | Discharge: 2023-01-07 | Disposition: A | Discharge status: Home / Self Care | Payer: Medicare Other | Source: Ambulatory Visit | Attending: Internal Medicine | Admitting: Internal Medicine

## 2023-01-07 DIAGNOSIS — E041 Nontoxic single thyroid nodule: Secondary | ICD-10-CM | POA: Diagnosis not present

## 2023-01-07 DIAGNOSIS — E0789 Other specified disorders of thyroid: Secondary | ICD-10-CM | POA: Diagnosis not present

## 2023-01-08 ENCOUNTER — Ambulatory Visit: Payer: Medicare Other | Admitting: Gastroenterology

## 2023-01-08 ENCOUNTER — Encounter: Payer: Self-pay | Admitting: Gastroenterology

## 2023-01-08 VITALS — BP 130/68 | HR 60 | Ht 62.0 in | Wt 168.0 lb

## 2023-01-08 DIAGNOSIS — D509 Iron deficiency anemia, unspecified: Secondary | ICD-10-CM | POA: Diagnosis not present

## 2023-01-08 DIAGNOSIS — R948 Abnormal results of function studies of other organs and systems: Secondary | ICD-10-CM

## 2023-01-08 MED ORDER — NA SULFATE-K SULFATE-MG SULF 17.5-3.13-1.6 GM/177ML PO SOLN: 1.0000 | Freq: Once | ORAL | 0 refills | Status: AC

## 2023-01-08 NOTE — Progress Notes (Signed)
Chief Complaint: abnormal PET scan Primary GI MD: Gentry Fitz  HPI: 72 year old female history of iron deficiency anemia (IV iron infusions per heme-onc), rheumatoid arthritis (on methotrexate until 2022 then switched to Arava and leflunomide), early onset Alzheimer's, hypertension, presents for evaluation of abnormal PET scan.  Patient was seen in emergency department November 2023 with atypical chest pain with negative troponins and EKG.  CTA was obtained since D-dimer was elevated and this showed axillary lymphadenopathy so patient was referred to pulmonology for further evaluation.  Patient then underwent PET scan 10/17/2022 which showed borderline axillary lymph nodes slightly decreased inside.  No uptake in hilar lymph nodes.  Uptake noted in shoulder joint, thyroid, nasopharynx, and cecum.  Patient notes previous colonoscopy many years ago.  Appears to be in 2004 with Dr. Madilyn Fireman.  She is unsure what it showed but remembers being a 10-year recall.  Her husband notes patient's sister had colon cancer diagnosed before age 71.  Patient denies GI symptoms.  Denies change in bowel habits, weight loss, nausea/vomiting, melena/hematochezia.  Negative Cologuard 12/23/2022  Patient has had iron deficiency anemia since 2021.  Has been receiving IV iron as needed.  No previous EGD.  Labs 11/2022 Hgb 12.4 (baseline 10-11) Iron 166, ferritin 1229, saturation 67% TSH 3.44  Past Medical History:  Diagnosis Date   Anemia    Hyperlipidemia    Hypertension    Pre-diabetes    Sleep apnea    Sleep apnea     Past Surgical History:  Procedure Laterality Date   GANGLION CYST EXCISION     l hand   JOINT REPLACEMENT     knee right   TUBAL LIGATION      Current Outpatient Medications  Medication Sig Dispense Refill   ALPRAZolam (XANAX) 0.5 MG tablet TAKE ONE TABLET BY MOUTH TWICE A DAY AS NEEDED FOR ANXIETY 60 tablet 2   ferrous sulfate 325 (65 FE) MG tablet      leflunomide (ARAVA) 20 MG  tablet Take 20 mg by mouth daily.     levothyroxine (SYNTHROID) 50 MCG tablet Take 1 tablet (50 mcg total) by mouth daily. 90 tablet 3   lisinopril (ZESTRIL) 10 MG tablet TAKE ONE TABLET BY MOUTH DAILY 90 tablet 3   Multiple Vitamin (MULTI VITAMIN) TABS 1 tablet     Multiple Vitamins-Minerals (MULTIVITAMIN WITH MINERALS) tablet Take 1 tablet by mouth daily.     Omega-3 1000 MG CAPS Take by mouth daily.     rosuvastatin (CRESTOR) 10 MG tablet Take 1 tablet (10 mg total) by mouth daily. 90 tablet 3   sertraline (ZOLOFT) 100 MG tablet TAKE ONE TABLET BY MOUTH DAILY 90 tablet 2   triamterene-hydrochlorothiazide (MAXZIDE-25) 37.5-25 MG tablet TAKE 1 TABLET BY MOUTH DAILY 90 tablet 1   No current facility-administered medications for this visit.    Allergies as of 01/08/2023   (No Known Allergies)    Family History  Problem Relation Age of Onset   Diabetes Mother    Hypertension Mother    Kidney disease Father    Heart disease Father    Stroke Sister    Liver disease Neg Hx    Colon cancer Neg Hx    Esophageal cancer Neg Hx     Social History   Socioeconomic History   Marital status: Married    Spouse name: Not on file   Number of children: 2   Years of education: Not on file   Highest education level: Not on file  Occupational  History   Occupation: retired  Tobacco Use   Smoking status: Never   Smokeless tobacco: Never  Vaping Use   Vaping status: Never Used  Substance and Sexual Activity   Alcohol use: Yes    Comment: rarely   Drug use: No   Sexual activity: Not on file  Other Topics Concern   Not on file  Social History Narrative   Not on file   Social Determinants of Health   Financial Resource Strain: Not on file  Food Insecurity: No Food Insecurity (05/16/2020)   Hunger Vital Sign    Worried About Running Out of Food in the Last Year: Never true    Ran Out of Food in the Last Year: Never true  Transportation Needs: No Transportation Needs (05/16/2020)    PRAPARE - Administrator, Civil Service (Medical): No    Lack of Transportation (Non-Medical): No  Physical Activity: Not on file  Stress: Not on file  Social Connections: Socially Integrated (12/04/2022)   Social Connection and Isolation Panel [NHANES]    Frequency of Communication with Friends and Family: Twice a week    Frequency of Social Gatherings with Friends and Family: Once a week    Attends Religious Services: 1 to 4 times per year    Active Member of [employer] or Organizations: Yes    Attends Banker Meetings: 1 to 4 times per year    Marital Status: Married  Catering manager Violence: Not on file    Review of Systems:    Constitutional: No weight loss, fever, chills, weakness or fatigue HEENT: Eyes: No change in vision               Ears, Nose, Throat:  No change in hearing or congestion Skin: No rash or itching Cardiovascular: No chest pain, chest pressure or palpitations   Respiratory: No SOB or cough Gastrointestinal: See HPI and otherwise negative Genitourinary: No dysuria or change in urinary frequency Neurological: No headache, dizziness or syncope Musculoskeletal: No new muscle or joint pain Hematologic: No bleeding or bruising Psychiatric: No history of depression or anxiety    Physical Exam:  Vital signs: BP 130/68   Pulse 60   Ht 5\' 2"  (1.575 m)   Wt 76.2 kg   BMI 30.73 kg/m   Constitutional: NAD, Well developed, Well nourished, alert and cooperative. Appears younger than stated age Head:  Normocephalic and atraumatic. Eyes:   PEERL, EOMI. No icterus. Conjunctiva pink. Respiratory: Respirations even and unlabored. Lungs clear to auscultation bilaterally.   No wheezes, crackles, or rhonchi.  Cardiovascular:  Regular rate and rhythm. No peripheral edema, cyanosis or pallor.  Gastrointestinal:  Soft, nondistended, nontender. No rebound or guarding. Normal bowel sounds. No appreciable masses or hepatomegaly. Rectal:  Not performed.   Msk:  Symmetrical without gross deformities. Without edema, no deformity or joint abnormality.  Neurologic:   grossly normal neurologically. Mistakes her husband for her brother though is aware to self/place/time. Skin:   Dry and intact without significant lesions or rashes.    RELEVANT LABS AND IMAGING: CBC    Component Value Date/Time   WBC 6.1 11/21/2022 1115   RBC 4.13 11/21/2022 1115   HGB 12.4 11/21/2022 1115   HGB 11.5 (L) 10/08/2022 1022   HCT 38.1 11/21/2022 1115   PLT 292 11/21/2022 1115   PLT 371 10/08/2022 1022   MCV 92.3 11/21/2022 1115   MCH 30.0 11/21/2022 1115   MCHC 32.5 11/21/2022 1115   RDW 12.0 11/21/2022  1115   LYMPHSABS 2,544 11/21/2022 1115   MONOABS 0.5 10/08/2022 1022   EOSABS 140 11/21/2022 1115   BASOSABS 31 11/21/2022 1115    CMP     Component Value Date/Time   NA 136 03/07/2022 1654   K 3.8 03/07/2022 1654   CL 100 03/07/2022 1654   CO2 23 03/07/2022 1654   GLUCOSE 111 (H) 03/07/2022 1654   BUN 20 03/07/2022 1654   CREATININE 0.72 03/07/2022 1654   CREATININE 0.65 11/25/2021 1210   CALCIUM 9.8 03/07/2022 1654   PROT 7.7 05/27/2022 0956   ALBUMIN 3.9 08/07/2020 1256   AST 20 05/27/2022 0956   AST 15 08/07/2020 1256   ALT 13 05/27/2022 0956   ALT 18 08/07/2020 1256   ALKPHOS 113 08/07/2020 1256   BILITOT 0.6 05/27/2022 0956   BILITOT 0.4 08/07/2020 1256   GFRNONAA >60 03/07/2022 1654   GFRNONAA >60 08/07/2020 1256   GFRNONAA 90 11/17/2019 1116   GFRAA 105 11/17/2019 1116     Assessment/Plan:   72 year old female history of early onset Alzheimer's, chronic iron deficiency anemia, family history of colon cancer in sister before age 2 with last colonoscopy being in 2004 presenting with PET scan done for axillary lymphadenopathy showing uptake in the cecum.  IDA Abnormal PET scan Longstanding chronic iron deficiency anemia followed by heme-onc with no previous EGD/colonoscopy.  With family history of colon cancer no previous EGD I  think it is reasonable for further endoscopic evaluation to rule out GI blood loss.  No overt bleeding.  -EGD/colonoscopy for further evaluation - I thoroughly discussed the procedure with the patient (at bedside) to include nature of the procedure, alternatives, benefits, and risks (including but not limited to bleeding, infection, perforation, anesthesia/cardiac pulmonary complications).  Patient verbalized understanding and gave verbal consent to proceed with procedure.  -Follow-up per procedure - Continue to follow with heme-onc for labs and IV iron   Demisha Nokes Jolee Ewing [facility] 01/08/2023, 10:28 AM  Cc: Margaree Mackintosh, MD

## 2023-01-08 NOTE — Patient Instructions (Signed)
You have been scheduled for an endoscopy and colonoscopy. Please follow the written instructions given to you at your visit today.  Please pick up your prep supplies at the pharmacy within the next 1-3 days.  If you use inhalers (even only as needed), please bring them with you on the day of your procedure.  DO NOT TAKE 7 DAYS PRIOR TO TEST- Trulicity (dulaglutide) Ozempic, Wegovy (semaglutide) Mounjaro (tirzepatide) Bydureon Bcise (exanatide extended release)  DO NOT TAKE 1 DAY PRIOR TO YOUR TEST Rybelsus (semaglutide) Adlyxin (lixisenatide) Victoza (liraglutide) Byetta (exanatide) ___________________________________________________________________________ _______________________________________________________  If your blood pressure at your visit was 140/90 or greater, please contact your primary care physician to follow up on this.  _______________________________________________________  If you are age 42 or older, your body mass index should be between 23-30. Your Body mass index is 30.73 kg/m. If this is out of the aforementioned range listed, please consider follow up with your Primary Care Provider.  If you are age 20 or younger, your body mass index should be between 19-25. Your Body mass index is 30.73 kg/m. If this is out of the aformentioned range listed, please consider follow up with your Primary Care Provider.   ________________________________________________________  The [facility] providers would like to encourage you to use [facility] to communicate with providers for non-urgent requests or questions.  Due to long hold times on the telephone, sending your provider a message by [facility] may be a faster and more efficient way to get a response.  Please allow 48 business hours for a response.  Please remember that this is for non-urgent requests.  _______________________________________________________

## 2023-01-09 LAB — CYTOLOGY - NON PAP

## 2023-01-09 NOTE — Progress Notes (Signed)
LVM that nodule was benign on both Voice mails

## 2023-01-12 NOTE — Progress Notes (Signed)
I agree with the assessment and plan as outlined by Ms. McMichael. 

## 2023-01-14 ENCOUNTER — Telehealth: Payer: Self-pay | Admitting: Pulmonary Disease

## 2023-01-14 NOTE — Telephone Encounter (Signed)
Husband calling. States she had a biopsy on her thyroid and it was benign. Does she still need to come in to see Dr. Isaiah Serge for a follow up early 2025?  Number is [phone]

## 2023-01-15 NOTE — Telephone Encounter (Signed)
Dr. Kimber Relic, please advise on follow up.

## 2023-01-23 ENCOUNTER — Other Ambulatory Visit: Payer: Self-pay | Admitting: Radiology

## 2023-01-23 MED ORDER — TRIAMTERENE-HCTZ 37.5-25 MG PO TABS: 1.0000 | ORAL_TABLET | Freq: Every day | ORAL | 1 refills | Status: DC

## 2023-01-28 DIAGNOSIS — R5383 Other fatigue: Secondary | ICD-10-CM | POA: Diagnosis not present

## 2023-01-28 DIAGNOSIS — M0579 Rheumatoid arthritis with rheumatoid factor of multiple sites without organ or systems involvement: Secondary | ICD-10-CM | POA: Diagnosis not present

## 2023-01-28 DIAGNOSIS — Z79899 Other long term (current) drug therapy: Secondary | ICD-10-CM | POA: Diagnosis not present

## 2023-02-05 ENCOUNTER — Other Ambulatory Visit: Payer: Self-pay | Admitting: Internal Medicine

## 2023-02-05 ENCOUNTER — Encounter: Payer: Self-pay | Admitting: Internal Medicine

## 2023-02-05 DIAGNOSIS — Z1231 Encounter for screening mammogram for malignant neoplasm of breast: Secondary | ICD-10-CM

## 2023-02-05 NOTE — Telephone Encounter (Signed)
1 year recall placed with Dr. Laurann Montana and spoke with patient husband Tulia (DPR). Dr. Shirlee More recommendations given.  Nothing further at this time.

## 2023-02-05 NOTE — Telephone Encounter (Signed)
I am glad that the biopsy was benign.  She can follow-up for routine visit in 1 year and does not need to see me in early 2025.  Please place a recall for 1 year.

## 2023-02-13 ENCOUNTER — Other Ambulatory Visit: Payer: Self-pay

## 2023-02-13 MED ORDER — SERTRALINE HCL 100 MG PO TABS: 100.0000 mg | ORAL_TABLET | Freq: Every day | ORAL | 2 refills | Status: DC

## 2023-02-19 ENCOUNTER — Ambulatory Visit: Payer: Medicare Other | Admitting: Internal Medicine

## 2023-02-19 ENCOUNTER — Encounter: Payer: Self-pay | Admitting: Internal Medicine

## 2023-02-19 VITALS — BP 148/75 | HR 50 | Temp 97.1°F | Resp 12 | Ht 62.0 in | Wt 168.0 lb

## 2023-02-19 DIAGNOSIS — K3189 Other diseases of stomach and duodenum: Secondary | ICD-10-CM | POA: Diagnosis not present

## 2023-02-19 DIAGNOSIS — D120 Benign neoplasm of cecum: Secondary | ICD-10-CM

## 2023-02-19 DIAGNOSIS — K2971 Gastritis, unspecified, with bleeding: Secondary | ICD-10-CM

## 2023-02-19 DIAGNOSIS — D509 Iron deficiency anemia, unspecified: Secondary | ICD-10-CM | POA: Diagnosis not present

## 2023-02-19 DIAGNOSIS — K2951 Unspecified chronic gastritis with bleeding: Secondary | ICD-10-CM | POA: Diagnosis not present

## 2023-02-19 DIAGNOSIS — R948 Abnormal results of function studies of other organs and systems: Secondary | ICD-10-CM

## 2023-02-19 DIAGNOSIS — B9681 Helicobacter pylori [H. pylori] as the cause of diseases classified elsewhere: Secondary | ICD-10-CM | POA: Diagnosis not present

## 2023-02-19 DIAGNOSIS — I10 Essential (primary) hypertension: Secondary | ICD-10-CM | POA: Diagnosis not present

## 2023-02-19 DIAGNOSIS — G4733 Obstructive sleep apnea (adult) (pediatric): Secondary | ICD-10-CM | POA: Diagnosis not present

## 2023-02-19 DIAGNOSIS — R7303 Prediabetes: Secondary | ICD-10-CM | POA: Diagnosis not present

## 2023-02-19 DIAGNOSIS — K2970 Gastritis, unspecified, without bleeding: Secondary | ICD-10-CM

## 2023-02-19 HISTORY — PX: COLONOSCOPY WITH ESOPHAGOGASTRODUODENOSCOPY (EGD): SHX5779

## 2023-02-19 MED ORDER — PANTOPRAZOLE SODIUM 40 MG PO TBEC: 40.0000 mg | DELAYED_RELEASE_TABLET | Freq: Every day | ORAL | 0 refills | Status: DC

## 2023-02-19 MED ORDER — SODIUM CHLORIDE 0.9 % IV SOLN
500.0000 mL | Freq: Once | INTRAVENOUS | Status: DC
Start: 2023-02-19 — End: 2023-02-19

## 2023-02-19 NOTE — Progress Notes (Signed)
Sedate, gd SR, tolerated procedure well, VSS, report to RN 

## 2023-02-19 NOTE — Op Note (Signed)
[facility] Patient Name: [name] Procedure Date: 02/19/2023 10:34 AM MRN: [id number] Endoscopist: Madelyn Brunner [place] , , [id number] Age: 72 Referring MD:  Date of Birth: 11-15-1950 Gender: Female Account #: [id number] Procedure:                Colonoscopy Indications:              Iron deficiency anemia, Abnormal PET scan of the GI                            tract Medicines:                Monitored Anesthesia Care Procedure:                Pre-Anesthesia Assessment:                           - Prior to the procedure, a History and Physical                            was performed, and patient medications and                            allergies were reviewed. The patient's tolerance of                            previous anesthesia was also reviewed. The risks                            and benefits of the procedure and the sedation                            options and risks were discussed with the patient.                            All questions were answered, and informed consent                            was obtained. Prior Anticoagulants: The patient has                            taken no anticoagulant or antiplatelet agents. ASA                            Grade Assessment: II - A patient with mild systemic                            disease. After reviewing the risks and benefits,                            the patient was deemed in satisfactory condition to                            undergo the procedure.  After obtaining informed consent, the colonoscope                            was passed under direct vision. Throughout the                            procedure, the patient's blood pressure, pulse, and                            oxygen saturations were monitored continuously. The                            Olympus CF-[id number] ([id number]) Colonoscope was                            introduced through the anus and advanced to the  the                            terminal ileum. The colonoscopy was performed                            without difficulty. The patient tolerated the                            procedure well. The quality of the bowel                            preparation was good. The terminal ileum, ileocecal                            valve, appendiceal orifice, and rectum were                            photographed. Scope In: 11:06:00 AM Scope Out: 11:22:26 AM Scope Withdrawal Time: 0 hours 14 minutes 31 seconds  Total Procedure Duration: 0 hours 16 minutes 26 seconds  Findings:                 The terminal ileum appeared normal.                           Two sessile polyps were found in the cecum. The                            polyps were 3 to 6 mm in size. These polyps were                            removed with a cold snare. Resection and retrieval                            were complete.                           Non-bleeding internal hemorrhoids were found during  retroflexion. Complications:            No immediate complications. Estimated Blood Loss:     Estimated blood loss was minimal. Impression:               - The examined portion of the ileum was normal.                           - Two 3 to 6 mm polyps in the cecum, removed with a                            cold snare. Resected and retrieved.                           - Non-bleeding internal hemorrhoids. Recommendation:           - Discharge patient to home (with escort).                           - Await pathology results.                           - The findings and recommendations were discussed                            with the patient. Dr Particia Lather "[name]" [name],  02/19/2023 11:39:36 AM

## 2023-02-19 NOTE — Progress Notes (Signed)
Called to room to assist during endoscopic procedure.  Patient ID and intended procedure confirmed with present staff. Received instructions for my participation in the procedure from the performing physician.  

## 2023-02-19 NOTE — Patient Instructions (Signed)

## 2023-02-19 NOTE — Op Note (Signed)
[facility] Patient Name: [name] Procedure Date: 02/19/2023 10:45 AM MRN: [id number] Endoscopist: Madelyn Brunner [place] , , [id number] Age: 72 Referring MD:  Date of Birth: 1951-01-12 Gender: Female Account #: [id number] Procedure:                Upper GI endoscopy Indications:              Iron deficiency anemia Medicines:                Monitored Anesthesia Care Procedure:                Pre-Anesthesia Assessment:                           - Prior to the procedure, a History and Physical                            was performed, and patient medications and                            allergies were reviewed. The patient's tolerance of                            previous anesthesia was also reviewed. The risks                            and benefits of the procedure and the sedation                            options and risks were discussed with the patient.                            All questions were answered, and informed consent                            was obtained. Prior Anticoagulants: The patient has                            taken no anticoagulant or antiplatelet agents. ASA                            Grade Assessment: II - A patient with mild systemic                            disease. After reviewing the risks and benefits,                            the patient was deemed in satisfactory condition to                            undergo the procedure.                           After obtaining informed consent, the endoscope was  passed under direct vision. Throughout the                            procedure, the patient's blood pressure, pulse, and                            oxygen saturations were monitored continuously. The                            Olympus Scope [id number] was introduced through the                            mouth, and advanced to the second part of duodenum.                            The upper GI endoscopy  was accomplished without                            difficulty. The patient tolerated the procedure                            well. Scope In: Scope Out: Findings:                 The examined esophagus was normal.                           Localized inflammation with hemorrhage                            characterized by congestion (edema), erosions,                            erythema and aphthous ulcerations was found in the                            gastric fundus, in the gastric body and in the                            gastric antrum. Biopsies were taken with a cold                            forceps for histology.                           The examined duodenum was normal. Biopsies were                            taken with a cold forceps for histology. Complications:            No immediate complications. Estimated Blood Loss:     Estimated blood loss was minimal. Impression:               - Normal esophagus.                           -  Gastritis with hemorrhage. Biopsied.                           - Normal examined duodenum. Biopsied. Recommendation:           - Await pathology results.                           - Use Protonix (pantoprazole) 40 mg PO BID for 8                            weeks.                           - Avoid NSAIDs if possible.                           - Perform a colonoscopy today. Dr Particia Lather "[name]" [name],  02/19/2023 11:37:23 AM

## 2023-02-19 NOTE — Progress Notes (Signed)
GASTROENTEROLOGY PROCEDURE H&P NOTE   Primary Care Physician: Margaree Mackintosh, MD    Reason for Procedure:   IDA, PET CT with cecum hypermetabolism  Plan:    EGD/colonoscopy  Patient is appropriate for endoscopic procedure(s) in the ambulatory ([facility]) setting.  The nature of the procedure, as well as the risks, benefits, and alternatives were carefully and thoroughly reviewed with the patient. Ample time for discussion and questions allowed. The patient understood, was satisfied, and agreed to proceed.     HPI: [name] is a 72 y.o. female who presents for EGD/colonoscopy for evaluation of IDA, PET CT with cecum hypermetabolism.  Patient was most recently seen in the Gastroenterology Clinic on 01/08/23.  No interval change in medical history since that appointment. Please refer to that note for full details regarding GI history and clinical presentation.   Past Medical History:  Diagnosis Date   Anemia    Arthritis    Hyperlipidemia    Hypertension    Pre-diabetes    Sleep apnea    Sleep apnea     Past Surgical History:  Procedure Laterality Date   GANGLION CYST EXCISION     l hand   JOINT REPLACEMENT     knee right   TUBAL LIGATION      Prior to Admission medications   Medication Sig Start Date End Date Taking? Authorizing Provider  ALPRAZolam Prudy Feeler) 0.5 MG tablet TAKE ONE TABLET BY MOUTH TWICE A DAY AS NEEDED FOR ANXIETY 06/17/22  Yes Baxley, Luanna Cole, MD  leflunomide (ARAVA) 20 MG tablet Take 20 mg by mouth daily.   Yes [provider]  lisinopril (ZESTRIL) 10 MG tablet TAKE ONE TABLET BY MOUTH DAILY 03/30/22  Yes Baxley, Luanna Cole, MD  Multiple Vitamins-Minerals (MULTIVITAMIN WITH MINERALS) tablet Take 1 tablet by mouth daily.   Yes [provider]  Omega-3 1000 MG CAPS Take by mouth daily.   Yes [provider]  rosuvastatin (CRESTOR) 10 MG tablet Take 1 tablet (10 mg total) by mouth daily. 12/04/22  Yes Baxley, Luanna Cole, MD  sertraline  (ZOLOFT) 100 MG tablet Take 1 tablet (100 mg total) by mouth daily. 02/13/23  Yes Baxley, Luanna Cole, MD  triamterene-hydrochlorothiazide (MAXZIDE-25) 37.5-25 MG tablet Take 1 tablet by mouth daily. 01/23/23  Yes BaxleyLuanna Cole, MD  ferrous sulfate 325 (65 FE) MG tablet  12/05/19   [provider]  levothyroxine (SYNTHROID) 50 MCG tablet Take 1 tablet (50 mcg total) by mouth daily. Patient not taking: Reported on 02/19/2023 11/23/20   Margaree Mackintosh, MD    Current Outpatient Medications  Medication Sig Dispense Refill   ALPRAZolam (XANAX) 0.5 MG tablet TAKE ONE TABLET BY MOUTH TWICE A DAY AS NEEDED FOR ANXIETY 60 tablet 2   leflunomide (ARAVA) 20 MG tablet Take 20 mg by mouth daily.     lisinopril (ZESTRIL) 10 MG tablet TAKE ONE TABLET BY MOUTH DAILY 90 tablet 3   Multiple Vitamins-Minerals (MULTIVITAMIN WITH MINERALS) tablet Take 1 tablet by mouth daily.     Omega-3 1000 MG CAPS Take by mouth daily.     rosuvastatin (CRESTOR) 10 MG tablet Take 1 tablet (10 mg total) by mouth daily. 90 tablet 3   sertraline (ZOLOFT) 100 MG tablet Take 1 tablet (100 mg total) by mouth daily. 90 tablet 2   triamterene-hydrochlorothiazide (MAXZIDE-25) 37.5-25 MG tablet Take 1 tablet by mouth daily. 90 tablet 1   ferrous sulfate 325 (65 FE) MG tablet  (Patient not  taking: Reported on 02/19/2023)     levothyroxine (SYNTHROID) 50 MCG tablet Take 1 tablet (50 mcg total) by mouth daily. (Patient not taking: Reported on 02/19/2023) 90 tablet 3   Current Facility-Administered Medications  Medication Dose Route Frequency Provider Last Rate Last Admin   0.9 %  sodium chloride infusion  500 mL Intravenous Once Imogene Burn, MD        Allergies as of 02/19/2023   (No Known Allergies)    Family History  Problem Relation Age of Onset   Diabetes Mother    Hypertension Mother    Kidney disease Father    Heart disease Father    Colon cancer Sister    Stroke Sister    Liver disease Neg Hx    Esophageal cancer  Neg Hx     Social History   Socioeconomic History   Marital status: Married    Spouse name: Not on file   Number of children: 2   Years of education: Not on file   Highest education level: Not on file  Occupational History   Occupation: retired  Tobacco Use   Smoking status: Never   Smokeless tobacco: Never  Vaping Use   Vaping status: Never Used  Substance and Sexual Activity   Alcohol use: Yes    Comment: rarely   Drug use: No   Sexual activity: Not on file  Other Topics Concern   Not on file  Social History Narrative   Not on file   Social Determinants of Health   Financial Resource Strain: Not on file  Food Insecurity: No Food Insecurity (05/16/2020)   Hunger Vital Sign    Worried About Running Out of Food in the Last Year: Never true    Ran Out of Food in the Last Year: Never true  Transportation Needs: No Transportation Needs (05/16/2020)   PRAPARE - Administrator, Civil Service (Medical): No    Lack of Transportation (Non-Medical): No  Physical Activity: Not on file  Stress: Not on file  Social Connections: Socially Integrated (12/04/2022)   Social Connection and Isolation Panel [NHANES]    Frequency of Communication with Friends and Family: Twice a week    Frequency of Social Gatherings with Friends and Family: Once a week    Attends Religious Services: 1 to 4 times per year    Active Member of [employer] or Organizations: Yes    Attends Banker Meetings: 1 to 4 times per year    Marital Status: Married  Catering manager Violence: Not on file    Physical Exam: Vital signs in last 24 hours: BP (!) 140/70   Pulse (!) 55   Temp (!) 97.1 F (36.2 C)   Ht 5\' 2"  (1.575 m)   Wt 168 lb (76.2 kg)   SpO2 98%   BMI 30.73 kg/m  GEN: NAD EYE: Sclerae anicteric ENT: MMM CV: Non-tachycardic Pulm: No increased WOB GI: Soft NEURO:  Alert & Oriented   Eulah Pont, MD [facility]   02/19/2023 10:38 AM

## 2023-02-20 ENCOUNTER — Telehealth: Payer: Self-pay

## 2023-02-20 NOTE — Telephone Encounter (Signed)
Left message on follow up call. 

## 2023-02-23 ENCOUNTER — Encounter: Payer: Self-pay | Admitting: Internal Medicine

## 2023-02-23 LAB — SURGICAL PATHOLOGY

## 2023-02-23 NOTE — Progress Notes (Signed)
Hi [name], please let the patient know that gastric biopsies pathology came back positive for H pylori gastritis. Recommend bismuth quadruple therapy for treatment: - Tetracycline 500 mg QID x 14 days - Flagyl 250 mg QID x 14 days - Bismuth subsalicylate 524 mg QID x 14 days - PPI BID x 14 days  Let's schedule a GI clinic follow up in 2-3 months. I will plan to test her for eradication of H pylori at that time.

## 2023-02-24 ENCOUNTER — Other Ambulatory Visit: Payer: Self-pay

## 2023-02-24 MED ORDER — TETRACYCLINE HCL 500 MG PO CAPS: 500.0000 mg | ORAL_CAPSULE | Freq: Four times a day (QID) | ORAL | 0 refills | Status: AC

## 2023-02-24 MED ORDER — PANTOPRAZOLE SODIUM 40 MG PO TBEC: DELAYED_RELEASE_TABLET | ORAL | Status: DC

## 2023-02-24 MED ORDER — METRONIDAZOLE 250 MG PO TABS: 250.0000 mg | ORAL_TABLET | Freq: Four times a day (QID) | ORAL | 0 refills | Status: AC

## 2023-02-27 ENCOUNTER — Other Ambulatory Visit: Payer: Self-pay

## 2023-03-02 MED ORDER — ALPRAZOLAM 0.5 MG PO TABS: 0.5000 mg | ORAL_TABLET | Freq: Two times a day (BID) | ORAL | 2 refills | Status: DC | PRN

## 2023-03-25 DIAGNOSIS — R5383 Other fatigue: Secondary | ICD-10-CM | POA: Diagnosis not present

## 2023-03-25 DIAGNOSIS — M0579 Rheumatoid arthritis with rheumatoid factor of multiple sites without organ or systems involvement: Secondary | ICD-10-CM | POA: Diagnosis not present

## 2023-03-25 DIAGNOSIS — Z79899 Other long term (current) drug therapy: Secondary | ICD-10-CM | POA: Diagnosis not present

## 2023-04-08 ENCOUNTER — Encounter: Payer: Self-pay | Admitting: Medical Oncology

## 2023-04-08 ENCOUNTER — Inpatient Hospital Stay: Payer: Medicare Other | Admitting: Medical Oncology

## 2023-04-08 ENCOUNTER — Inpatient Hospital Stay: Payer: Medicare Other | Attending: Hematology & Oncology

## 2023-04-08 VITALS — BP 135/60 | HR 53 | Temp 98.3°F | Resp 18 | Ht 62.0 in | Wt 164.0 lb

## 2023-04-08 DIAGNOSIS — D509 Iron deficiency anemia, unspecified: Secondary | ICD-10-CM | POA: Diagnosis not present

## 2023-04-08 LAB — RETICULOCYTES
Immature Retic Fract: 2.9 % (ref 2.3–15.9)
RBC.: 4.33 MIL/uL (ref 3.87–5.11)
Retic Count, Absolute: 48.5 10*3/uL (ref 19.0–186.0)
Retic Ct Pct: 1.1 % (ref 0.4–3.1)

## 2023-04-08 LAB — CBC WITH DIFFERENTIAL (CANCER CENTER ONLY)
Abs Immature Granulocytes: 0.06 10*3/uL (ref 0.00–0.07)
Basophils Absolute: 0 10*3/uL (ref 0.0–0.1)
Basophils Relative: 1 %
Eosinophils Absolute: 0.1 10*3/uL (ref 0.0–0.5)
Eosinophils Relative: 1 %
HCT: 41.1 % (ref 36.0–46.0)
Hemoglobin: 13.2 g/dL (ref 12.0–15.0)
Immature Granulocytes: 1 %
Lymphocytes Relative: 38 %
Lymphs Abs: 2.4 10*3/uL (ref 0.7–4.0)
MCH: 30.6 pg (ref 26.0–34.0)
MCHC: 32.1 g/dL (ref 30.0–36.0)
MCV: 95.4 fL (ref 80.0–100.0)
Monocytes Absolute: 0.6 10*3/uL (ref 0.1–1.0)
Monocytes Relative: 10 %
Neutro Abs: 3.2 10*3/uL (ref 1.7–7.7)
Neutrophils Relative %: 49 %
Platelet Count: 255 10*3/uL (ref 150–400)
RBC: 4.31 MIL/uL (ref 3.87–5.11)
RDW: 12.8 % (ref 11.5–15.5)
WBC Count: 6.4 10*3/uL (ref 4.0–10.5)
nRBC: 0 % (ref 0.0–0.2)

## 2023-04-08 LAB — IRON AND IRON BINDING CAPACITY (CC-WL,HP ONLY)
Iron: 151 ug/dL (ref 28–170)
Saturation Ratios: 56 % — ABNORMAL HIGH (ref 10.4–31.8)
TIBC: 272 ug/dL (ref 250–450)
UIBC: 121 ug/dL — ABNORMAL LOW (ref 148–442)

## 2023-04-08 LAB — FERRITIN: Ferritin: 1553 ng/mL — ABNORMAL HIGH (ref 11–307)

## 2023-04-08 NOTE — Progress Notes (Signed)
Hematology and Oncology Follow Up Visit  [name] [id number] 1950/07/06 72 y.o. 04/08/2023   Principle Diagnosis:  Iron deficiency anemia    Current Therapy:        IV iron as indicated- Feraheme- last dose 04/16/2022   Interim History:  [name] is here today for follow-up. She is here with her husband.   She is doing well and has no complaints at this time.  She is working with her endocrinologist regarding her TSH levels and thyroid nodule.  She notes occasional fatigue but is resting well at night.  No blood loss noted. No abnormal bruising, no petechiae.  No fever, chills, n/v, cough, rash, dizziness, SOB, chest pain, palpitations, abdominal pain or changes in bowel or bladder habits.  No swelling, tenderness, numbness or tingling in her extremities at this time.  No falls or syncope reported.  Appetite and hydration are good.  Wt Readings from Last 3 Encounters:  04/08/23 164 lb (74.4 kg)  02/19/23 168 lb (76.2 kg)  01/08/23 168 lb (76.2 kg)   ECOG Performance Status: 1 - Symptomatic but completely ambulatory  Medications:  Allergies as of 04/08/2023   No Known Allergies      Medication List        Accurate as of April 08, 2023 11:13 AM. If you have any questions, ask your nurse or doctor.          STOP taking these medications    ferrous sulfate 325 (65 FE) MG tablet Stopped by: Rushie Chestnut       TAKE these medications    ALPRAZolam 0.5 MG tablet Commonly known as: XANAX Take 1 tablet (0.5 mg total) by mouth 2 (two) times daily as needed for anxiety.   leflunomide 20 MG tablet Commonly known as: ARAVA Take 20 mg by mouth daily.   levothyroxine 50 MCG tablet Commonly known as: SYNTHROID Take 1 tablet (50 mcg total) by mouth daily.   lisinopril 10 MG tablet Commonly known as: ZESTRIL TAKE ONE TABLET BY MOUTH DAILY   multivitamin with minerals tablet Take 1 tablet by mouth daily.   Omega-3 1000 MG Caps Take by mouth  daily.   pantoprazole 40 MG tablet Commonly known as: PROTONIX 1 tablet BID x 14 days then once daily   rosuvastatin 10 MG tablet Commonly known as: Crestor Take 1 tablet (10 mg total) by mouth daily.   sertraline 100 MG tablet Commonly known as: ZOLOFT Take 1 tablet (100 mg total) by mouth daily.   triamterene-hydrochlorothiazide 37.5-25 MG tablet Commonly known as: MAXZIDE-25 Take 1 tablet by mouth daily.        Allergies: No Known Allergies  Past Medical History, Surgical history, Social history, and Family History were reviewed and updated.  Review of Systems: All other 10 point review of systems is negative.   Physical Exam:  height is 5\' 2"  (1.575 m) and weight is 164 lb (74.4 kg). Her oral temperature is 98.3 F (36.8 C). Her blood pressure is 135/60 and her pulse is 53 (abnormal). Her respiration is 18 and oxygen saturation is 100%.   Wt Readings from Last 3 Encounters:  04/08/23 164 lb (74.4 kg)  02/19/23 168 lb (76.2 kg)  01/08/23 168 lb (76.2 kg)    Ocular: Sclerae unicteric, pupils equal, round and reactive to light Ear-nose-throat: Oropharynx clear, dentition fair Lymphatic: No cervical or supraclavicular adenopathy Lungs no rales or rhonchi, good excursion bilaterally Heart regular rate and rhythm, no murmur appreciated Abd soft, nontender,  positive bowel sounds MSK no focal spinal tenderness, no joint edema Neuro: non-focal, well-oriented, appropriate affect  Lab Results  Component Value Date   WBC 6.4 04/08/2023   HGB 13.2 04/08/2023   HCT 41.1 04/08/2023   MCV 95.4 04/08/2023   PLT 255 04/08/2023   Lab Results  Component Value Date   FERRITIN 1,229 (H) 11/21/2022   IRON 166 (H) 11/21/2022   TIBC 246 (L) 11/21/2022   UIBC 106 (L) 10/08/2022   IRONPCTSAT 67 (H) 11/21/2022   Lab Results  Component Value Date   RETICCTPCT 1.1 04/08/2023   RBC 4.31 04/08/2023   RBC 4.33 04/08/2023   RETICCTABS 65,110 05/01/2020   No results found for:  "KPAFRELGTCHN", "LAMBDASER", "KAPLAMBRATIO" No results found for: "IGGSERUM", "IGA", "IGMSERUM" Lab Results  Component Value Date   ALBUMINELP 3.7 (L) 08/25/2019   A1GS 0.4 (H) 08/25/2019   A2GS 0.8 08/25/2019   BETS 0.5 08/25/2019   BETA2SER 0.6 (H) 08/25/2019   GAMS 2.0 (H) 08/25/2019   SPEI  08/25/2019     Comment:     . One or more serum protein fractions are outside the normal ranges.  No abnormal protein bands are apparent. .      Chemistry      Component Value Date/Time   NA 136 03/07/2022 1654   K 3.8 03/07/2022 1654   CL 100 03/07/2022 1654   CO2 23 03/07/2022 1654   BUN 20 03/07/2022 1654   CREATININE 0.72 03/07/2022 1654   CREATININE 0.65 11/25/2021 1210      Component Value Date/Time   CALCIUM 9.8 03/07/2022 1654   ALKPHOS 113 08/07/2020 1256   AST 20 05/27/2022 0956   AST 15 08/07/2020 1256   ALT 13 05/27/2022 0956   ALT 18 08/07/2020 1256   BILITOT 0.6 05/27/2022 0956   BILITOT 0.4 08/07/2020 1256     Encounter Diagnosis  Name Primary?   Iron deficiency anemia, unspecified iron deficiency anemia type Yes    Impression and Plan: [name] is a very pleasant 72 yo [place] American female with iron deficiency anemia.    Hgb 13.2 Iron studies are pending. Will replace if needed RTC 6 months APP, labs (CBC, iron, ferritin)  Rushie Chestnut, PA-C 12/18/202411:13 AM

## 2023-04-28 ENCOUNTER — Other Ambulatory Visit: Payer: Self-pay | Admitting: Internal Medicine

## 2023-04-30 DIAGNOSIS — M1991 Primary osteoarthritis, unspecified site: Secondary | ICD-10-CM | POA: Diagnosis not present

## 2023-04-30 DIAGNOSIS — M0579 Rheumatoid arthritis with rheumatoid factor of multiple sites without organ or systems involvement: Secondary | ICD-10-CM | POA: Diagnosis not present

## 2023-04-30 DIAGNOSIS — R5382 Chronic fatigue, unspecified: Secondary | ICD-10-CM | POA: Diagnosis not present

## 2023-05-20 DIAGNOSIS — M0579 Rheumatoid arthritis with rheumatoid factor of multiple sites without organ or systems involvement: Secondary | ICD-10-CM | POA: Diagnosis not present

## 2023-05-20 DIAGNOSIS — Z79899 Other long term (current) drug therapy: Secondary | ICD-10-CM | POA: Diagnosis not present

## 2023-05-20 DIAGNOSIS — R7989 Other specified abnormal findings of blood chemistry: Secondary | ICD-10-CM | POA: Diagnosis not present

## 2023-06-08 ENCOUNTER — Ambulatory Visit
Admission: RE | Admit: 2023-06-08 | Discharge: 2023-06-08 | Disposition: A | Discharge status: Home / Self Care | Payer: Medicare Other | Source: Ambulatory Visit | Attending: Internal Medicine | Admitting: Internal Medicine

## 2023-06-08 DIAGNOSIS — Z1231 Encounter for screening mammogram for malignant neoplasm of breast: Secondary | ICD-10-CM

## 2023-06-09 ENCOUNTER — Other Ambulatory Visit: Payer: Medicare Other

## 2023-06-09 DIAGNOSIS — E7800 Pure hypercholesterolemia, unspecified: Secondary | ICD-10-CM | POA: Diagnosis not present

## 2023-06-09 DIAGNOSIS — I10 Essential (primary) hypertension: Secondary | ICD-10-CM | POA: Diagnosis not present

## 2023-06-09 DIAGNOSIS — E039 Hypothyroidism, unspecified: Secondary | ICD-10-CM | POA: Diagnosis not present

## 2023-06-10 LAB — HEPATIC FUNCTION PANEL
AG Ratio: 1.5 (calc) (ref 1.0–2.5)
ALT: 20 U/L (ref 6–29)
AST: 25 U/L (ref 10–35)
Albumin: 4.4 g/dL (ref 3.6–5.1)
Alkaline phosphatase (APISO): 104 U/L (ref 37–153)
Bilirubin, Direct: 0.2 mg/dL (ref 0.0–0.2)
Globulin: 2.9 g/dL (ref 1.9–3.7)
Indirect Bilirubin: 0.6 mg/dL (ref 0.2–1.2)
Total Bilirubin: 0.8 mg/dL (ref 0.2–1.2)
Total Protein: 7.3 g/dL (ref 6.1–8.1)

## 2023-06-10 LAB — LIPID PANEL
Cholesterol: 201 mg/dL — ABNORMAL HIGH (ref <200)
HDL: 77 mg/dL (ref >=50)
LDL Cholesterol (Calc): 108 mg/dL — ABNORMAL HIGH
Non-HDL Cholesterol (Calc): 124 mg/dL (ref <130)
Total CHOL/HDL Ratio: 2.6 (calc) (ref <5.0)
Triglycerides: 75 mg/dL (ref <150)

## 2023-06-10 LAB — TSH: TSH: 2.22 m[IU]/L (ref 0.40–4.50)

## 2023-06-11 ENCOUNTER — Ambulatory Visit: Payer: Medicare Other | Admitting: Internal Medicine

## 2023-06-15 ENCOUNTER — Encounter: Payer: Self-pay | Admitting: Internal Medicine

## 2023-06-15 ENCOUNTER — Ambulatory Visit (INDEPENDENT_AMBULATORY_CARE_PROVIDER_SITE_OTHER): Payer: Medicare Other | Admitting: Internal Medicine

## 2023-06-15 VITALS — BP 120/74 | HR 55 | Temp 98.1°F | Ht 62.0 in | Wt 164.0 lb

## 2023-06-15 DIAGNOSIS — E039 Hypothyroidism, unspecified: Secondary | ICD-10-CM | POA: Diagnosis not present

## 2023-06-15 DIAGNOSIS — R413 Other amnesia: Secondary | ICD-10-CM | POA: Diagnosis not present

## 2023-06-15 DIAGNOSIS — E7800 Pure hypercholesterolemia, unspecified: Secondary | ICD-10-CM

## 2023-06-15 DIAGNOSIS — Z8659 Personal history of other mental and behavioral disorders: Secondary | ICD-10-CM

## 2023-06-15 DIAGNOSIS — R011 Cardiac murmur, unspecified: Secondary | ICD-10-CM | POA: Diagnosis not present

## 2023-06-15 DIAGNOSIS — Z23 Encounter for immunization: Secondary | ICD-10-CM | POA: Diagnosis not present

## 2023-06-15 DIAGNOSIS — G4733 Obstructive sleep apnea (adult) (pediatric): Secondary | ICD-10-CM

## 2023-06-15 DIAGNOSIS — I10 Essential (primary) hypertension: Secondary | ICD-10-CM | POA: Diagnosis not present

## 2023-06-15 NOTE — Patient Instructions (Signed)
 Continue same meds and follow up with wellness visit in 6 months. It was a pleasure to see you today.

## 2023-06-15 NOTE — Progress Notes (Addendum)
 Patient Care Team: Margaree Mackintosh, MD as PCP - General (Internal Medicine)  Visit Date: 06/15/23  Subjective:   Chief Complaint  Patient presents with   Medical Management of Chronic Issues   Hypertension   Hyperlipidemia   Hypothyroidism   BP Readings from Last 1 Encounters:  06/15/23 120/74   Patient LO:VFIEPP T Marzella,Female DOB:01/06/1951,72 y.o. IRJ:[id number]   73 y.o. Female presents today for 6 months follow-up for HTN & HLD. Patient has a past medical history of Dementia, Anxiety/Depression, Hypothyroidism, and Rheumatoid Arthritis. Last seen 12/04/2022 for her annual visit, in the interim has seen Theda Belfast with Ophthalmology, Boone Master, PA-C with GI for iron deficiency anemia; abnormal PET scan of colon, Ambrose Finland with [facility], Zenovia Jordan with [facility] x2 (03/2023 & 04/2023), and Clent Jacks, PA-C with Oncology for iron deficiency anemia. She is here with her husband, who provided some history.   History of Hypertension with Blood Pressure: normotensive today at 120/74.  History of Hyperlipidemia with 06/10/2023 Lipid Panel 06/09/2023 201, decreased from 315 and LDL 108, decreased from 228 on 11/21/2022, otherwise normal.   History of Hypothyroidism with  06/10/2023 TSH 2.22, decreased from 3.44 11/21/22. 01/07/2023 had a US Thyroid guided fine needle aspiration of calcified nodule #1 in mid right thyroid lobe.   History of Dementia. Husband today reports that they're going to start the ReCODE program and will have the initial appointment this Wednesday.   History of Rheumatoid Arthritis treated with Simponi Aria IV, followed by [facility].   History of GERD treated with 40 mg Protonix, followed by Norwood Levo, MD.   06/08/2023 mammogram normal with repeat recommended in 2026.   Vaccine Counseling: discussed, overdue for Tdap since 12/2022. Updating flu today. Past Medical History:  Diagnosis Date   Anemia     Arthritis    Cognitive deficits    Hyperlipidemia    Hypertension    Memory loss or impairment    Pre-diabetes    Sleep apnea   No Known Allergies  Family History  Problem Relation Age of Onset   Diabetes Mother    Hypertension Mother    Kidney disease Father    Heart disease Father    Colon cancer Sister    Stroke Sister    Liver disease Neg Hx    Esophageal cancer Neg Hx    Social History   Social History Narrative   Not on file   Review of Systems  Constitutional:  Negative for fever and malaise/fatigue.  HENT:  Negative for congestion.   Eyes:  Negative for blurred vision.  Respiratory:  Negative for cough and shortness of breath.   Cardiovascular:  Negative for chest pain, palpitations and leg swelling.  Gastrointestinal:  Negative for vomiting.  Musculoskeletal:  Negative for back pain.  Skin:  Negative for rash.  Neurological:  Negative for loss of consciousness and headaches.     Objective:  Vitals: BP 120/74   Pulse (!) 55   Ht 5\' 2"  (1.575 m)   Wt 164 lb (74.4 kg)   SpO2 98%   BMI 30.00 kg/m   Physical Exam Vitals and nursing note reviewed.  Constitutional:      General: She is not in acute distress.    Appearance: Normal appearance. She is not toxic-appearing.  HENT:     Head: Normocephalic and atraumatic.  Neck:     Thyroid: No thyromegaly or thyroid tenderness.  Cardiovascular:     Rate and Rhythm: Normal rate and  regular rhythm. No extrasystoles are present.    Pulses: Normal pulses.     Heart sounds: Normal heart sounds. No murmur heard.    No friction rub. No gallop.  Pulmonary:     Effort: Pulmonary effort is normal. No respiratory distress.     Breath sounds: Normal breath sounds. No wheezing or rales.  Skin:    General: Skin is warm and dry.  Neurological:     Mental Status: She is alert and oriented to person, place, and time. Mental status is at baseline.  Psychiatric:        Mood and Affect: Mood normal.        Behavior:  Behavior normal.        Thought Content: Thought content normal.        Judgment: Judgment normal.        12/04/2022   10:54 AM 11/26/2021   11:00 AM  6CIT Screen  What Year? 4 points 4 points  What month? 3 points 3 points  What time? 3 points 3 points  Count back from 20 0 points 0 points  Months in reverse 0 points 4 points  Repeat phrase 0 points 10 points  Total Score 10 points 24 points    Results:  Studies Obtained And Personally Reviewed By Me:  06/08/2023 mammogram normal with repeat recommended in 2026.   01/07/2023 US Thyroid guided fine needle aspiration of calcified nodule #1 in mid right thyroid lobe.   Labs:     Component Value Date/Time   NA 136 03/07/2022 1654   K 3.8 03/07/2022 1654   CL 100 03/07/2022 1654   CO2 23 03/07/2022 1654   GLUCOSE 111 (H) 03/07/2022 1654   BUN 20 03/07/2022 1654   CREATININE 0.72 03/07/2022 1654   CREATININE 0.65 11/25/2021 1210   CALCIUM 9.8 03/07/2022 1654   PROT 7.3 06/09/2023 1151   ALBUMIN 3.9 08/07/2020 1256   AST 25 06/09/2023 1151   AST 15 08/07/2020 1256   ALT 20 06/09/2023 1151   ALT 18 08/07/2020 1256   ALKPHOS 113 08/07/2020 1256   BILITOT 0.8 06/09/2023 1151   BILITOT 0.4 08/07/2020 1256   GFRNONAA >60 03/07/2022 1654   GFRNONAA >60 08/07/2020 1256   GFRNONAA 90 11/17/2019 1116   GFRAA 105 11/17/2019 1116    Lab Results  Component Value Date   WBC 6.4 04/08/2023   HGB 13.2 04/08/2023   HCT 41.1 04/08/2023   MCV 95.4 04/08/2023   PLT 255 04/08/2023   Lab Results  Component Value Date   CHOL 201 (H) 06/09/2023   HDL 77 06/09/2023   LDLCALC 108 (H) 06/09/2023   TRIG 75 06/09/2023   CHOLHDL 2.6 06/09/2023   Lab Results  Component Value Date   HGBA1C 5.2 11/21/2022    Lab Results  Component Value Date   TSH 2.22 06/09/2023    Assessment & Plan:   Hypertension with Blood Pressure: normotensive today at 120/74.  Hyperlipidemia with 06/10/2023 Lipid Panel 06/09/2023 201, decreased from 315 and  LDL 108, decreased from 228 on 11/21/2022, otherwise normal.   Hypothyroidism with  06/10/2023 TSH 2.22, decreased from 3.44 11/21/22. 01/07/2023 had a US Thyroid guided fine needle aspiration of calcified nodule #1 in mid right thyroid lobe.   Dementia going to start the ReCODE program and will have the initial evaluation appointment this Wednesday.   Rheumatoid Arthritis treated with Simponi Aria IV, followed by [facility].   GERD treated with 40 mg Protonix, followed by Nicole Kindred  [name], MD.   Mammogram normal with repeat recommended in 2026.   Vaccine Counseling: discussed, overdue for Tdap since 12/2022. Updating flu vaccine today.  Return in 6 months for labs 11/30/2023 at 9:30 AM and 12/07/2023 at 11 AM for annual visit.   I,Emily Lagle,acting as a Neurosurgeon for Margaree Mackintosh, MD.,have documented all relevant documentation on the behalf of Margaree Mackintosh, MD,as directed by  Margaree Mackintosh, MD while in the presence of Margaree Mackintosh, MD.   I, Margaree Mackintosh, MD, have reviewed all documentation for this visit. The documentation on 06/15/23 for the exam, diagnosis, procedures, and orders are all accurate and complete.

## 2023-06-17 ENCOUNTER — Ambulatory Visit: Payer: Medicare Other | Admitting: Internal Medicine

## 2023-07-14 ENCOUNTER — Other Ambulatory Visit: Payer: Self-pay | Admitting: *Deleted

## 2023-07-14 MED ORDER — LISINOPRIL 10 MG PO TABS: 10.0000 mg | ORAL_TABLET | Freq: Every day | ORAL | 3 refills | Status: AC

## 2023-07-16 DIAGNOSIS — M0579 Rheumatoid arthritis with rheumatoid factor of multiple sites without organ or systems involvement: Secondary | ICD-10-CM | POA: Diagnosis not present

## 2023-07-16 DIAGNOSIS — Z79899 Other long term (current) drug therapy: Secondary | ICD-10-CM | POA: Diagnosis not present

## 2023-07-16 DIAGNOSIS — R5383 Other fatigue: Secondary | ICD-10-CM | POA: Diagnosis not present

## 2023-07-22 ENCOUNTER — Telehealth: Payer: Self-pay | Admitting: Internal Medicine

## 2023-07-22 NOTE — Telephone Encounter (Signed)
 Received Fax RX request from  Pharmacy -  [id number] - [place], [place] - [address] Phone: [phone]  Fax: [phone]      Medication - triamterene-hydrochlorothiazide (MAXZIDE-25) 37.5-25 MG tablet   Last Refill - 04/23/2023  Last OV - 06/15/2023  Last CPE - 12/04/2022  Next Appointment - 12/07/2023

## 2023-07-23 ENCOUNTER — Encounter: Payer: Self-pay | Admitting: Internal Medicine

## 2023-07-23 ENCOUNTER — Telehealth: Payer: Self-pay | Admitting: Internal Medicine

## 2023-07-23 MED ORDER — TRIAMTERENE-HCTZ 37.5-25 MG PO TABS: 1.0000 | ORAL_TABLET | Freq: Every day | ORAL | 3 refills | Status: AC

## 2023-07-23 NOTE — Telephone Encounter (Signed)
 Called to tell Genevie Cheshire that [place] medication had been sent to pharmacy and he told [place] [facility] [place] and fell face first and broke her 2 front teeth. She is seeing the dentist about that. The EMS can and checked her out, her vitals were good son Genevie Cheshire took her home, she is okay today, he just wanted you to know she had fallen.

## 2023-07-23 NOTE — Telephone Encounter (Signed)
 Refill  Maxzide 25 #90 one tab daily with prn one year refills sent to pharmacy. MJB, MD

## 2023-09-04 ENCOUNTER — Ambulatory Visit: Admitting: Radiology

## 2023-09-04 ENCOUNTER — Ambulatory Visit
Admission: EM | Admit: 2023-09-04 | Discharge: 2023-09-04 | Disposition: A | Discharge status: Home / Self Care | Attending: Family Medicine | Admitting: Family Medicine

## 2023-09-04 DIAGNOSIS — S62640A Nondisplaced fracture of proximal phalanx of right index finger, initial encounter for closed fracture: Secondary | ICD-10-CM | POA: Diagnosis not present

## 2023-09-04 DIAGNOSIS — S62642A Nondisplaced fracture of proximal phalanx of right middle finger, initial encounter for closed fracture: Secondary | ICD-10-CM | POA: Diagnosis not present

## 2023-09-04 DIAGNOSIS — M19041 Primary osteoarthritis, right hand: Secondary | ICD-10-CM | POA: Diagnosis not present

## 2023-09-04 DIAGNOSIS — M19031 Primary osteoarthritis, right wrist: Secondary | ICD-10-CM | POA: Diagnosis not present

## 2023-09-04 DIAGNOSIS — W19XXXA Unspecified fall, initial encounter: Secondary | ICD-10-CM

## 2023-09-04 DIAGNOSIS — M79641 Pain in right hand: Secondary | ICD-10-CM

## 2023-09-04 DIAGNOSIS — S00531A Contusion of lip, initial encounter: Secondary | ICD-10-CM

## 2023-09-04 DIAGNOSIS — S00532A Contusion of oral cavity, initial encounter: Secondary | ICD-10-CM | POA: Diagnosis not present

## 2023-09-04 DIAGNOSIS — S0081XA Abrasion of other part of head, initial encounter: Secondary | ICD-10-CM | POA: Diagnosis not present

## 2023-09-04 DIAGNOSIS — S62610A Displaced fracture of proximal phalanx of right index finger, initial encounter for closed fracture: Secondary | ICD-10-CM | POA: Diagnosis not present

## 2023-09-04 MED ORDER — BACITRACIN ZINC 500 UNIT/GM EX OINT: 1.0000 | TOPICAL_OINTMENT | Freq: Two times a day (BID) | CUTANEOUS | 0 refills | Status: AC

## 2023-09-04 MED ORDER — ACETAMINOPHEN 325 MG PO TABS: 650.0000 mg | ORAL_TABLET | Freq: Four times a day (QID) | ORAL | 0 refills | Status: AC | PRN

## 2023-09-04 MED ORDER — TRAMADOL HCL 50 MG PO TABS: 50.0000 mg | ORAL_TABLET | Freq: Four times a day (QID) | ORAL | 0 refills | Status: DC | PRN

## 2023-09-04 NOTE — ED Provider Notes (Signed)
 [facility]  Note:  This document was prepared using Conservation officer, historic buildings and may include unintentional dictation errors.  MRN: [id number] DOB: 11-28-1950  Subjective:   [name] is a 73 y.o. female presenting for an evaluation following an accidental fall.  Patient accidentally tripped and landed on her face and right hand.  She observed most of the impact with her hand underneath her and then her face, specifically of the left.  She did have some bleeding and has had some progressive lip swelling, hand pain and swelling.  No loss of consciousness, confusion, weakness, numbness or tingling, vision changes, changes to bowel or urinary habits.  No current facility-administered medications for this encounter.  Current Outpatient Medications:    ALPRAZolam  (XANAX ) 0.5 MG tablet, Take 1 tablet (0.5 mg total) by mouth 2 (two) times daily as needed for anxiety., Disp: 60 tablet, Rfl: 2   leflunomide (ARAVA) 20 MG tablet, Take 20 mg by mouth daily., Disp: , Rfl:    levothyroxine  (SYNTHROID ) 50 MCG tablet, Take 1 tablet (50 mcg total) by mouth daily. (Patient not taking: Reported on 06/15/2023), Disp: 90 tablet, Rfl: 3   lisinopril  (ZESTRIL ) 10 MG tablet, Take 1 tablet (10 mg total) by mouth daily., Disp: 90 tablet, Rfl: 3   Multiple Vitamins-Minerals (MULTIVITAMIN WITH MINERALS) tablet, Take 1 tablet by mouth daily., Disp: , Rfl:    Omega-3 1000 MG CAPS, Take by mouth daily., Disp: , Rfl:    pantoprazole  (PROTONIX ) 40 MG tablet, TAKE 1 TABLET BY MOUTH DAILY (Patient not taking: Reported on 06/15/2023), Disp: 90 tablet, Rfl: 1   rosuvastatin  (CRESTOR ) 10 MG tablet, Take 1 tablet (10 mg total) by mouth daily., Disp: 90 tablet, Rfl: 3   sertraline  (ZOLOFT ) 100 MG tablet, Take 1 tablet (100 mg total) by mouth daily., Disp: 90 tablet, Rfl: 2   triamterene -hydrochlorothiazide  (MAXZIDE-25) 37.5-25 MG tablet, Take 1 tablet by mouth daily., Disp: 90 tablet, Rfl: 3    No Known Allergies  Past Medical History:  Diagnosis Date   Anemia    Arthritis    Cognitive deficits    Hyperlipidemia    Hypertension    Memory loss or impairment    Pre-diabetes    Sleep apnea      Past Surgical History:  Procedure Laterality Date   COLONOSCOPY  2011   COLONOSCOPY WITH ESOPHAGOGASTRODUODENOSCOPY (EGD)  02/19/2023   Regino Caprio at [facility], gastritis   GANGLION CYST EXCISION     l hand   JOINT REPLACEMENT     knee right   TUBAL LIGATION      Family History  Problem Relation Age of Onset   Diabetes Mother    Hypertension Mother    Kidney disease Father    Heart disease Father    Colon cancer Sister    Stroke Sister    Liver disease Neg Hx    Esophageal cancer Neg Hx     Social History   Tobacco Use   Smoking status: Never   Smokeless tobacco: Never  Vaping Use   Vaping status: Never Used  Substance Use Topics   Alcohol use: Yes    Comment: rarely   Drug use: No    ROS   Objective:   Vitals: BP (!) 149/78 (BP Location: Right Arm)   Pulse 64   Temp (!) 97.2 F (36.2 C) (Oral)   Resp 18   SpO2 95%   Physical Exam Constitutional:      General:  She is not in acute distress.    Appearance: Normal appearance. She is well-developed and normal weight. She is not ill-appearing, toxic-appearing or diaphoretic.  HENT:     Head: Normocephalic and atraumatic.      Right Ear: Tympanic membrane, ear canal and external ear normal. No drainage or tenderness. No middle ear effusion. There is no impacted cerumen. Tympanic membrane is not erythematous or bulging.     Left Ear: Tympanic membrane, ear canal and external ear normal. No drainage or tenderness.  No middle ear effusion. There is no impacted cerumen. Tympanic membrane is not erythematous or bulging.     Nose: Nose normal. No congestion or rhinorrhea.     Mouth/Throat:     Mouth: Mucous membranes are moist. No oral lesions.     Pharynx: No pharyngeal swelling, oropharyngeal exudate,  posterior oropharyngeal erythema or uvula swelling.     Tonsils: No tonsillar exudate or tonsillar abscesses.  Eyes:     General: No scleral icterus.       Right eye: No discharge.        Left eye: No discharge.     Extraocular Movements: Extraocular movements intact.     Right eye: Normal extraocular motion.     Left eye: Normal extraocular motion.     Conjunctiva/sclera: Conjunctivae normal.  Cardiovascular:     Rate and Rhythm: Normal rate.  Pulmonary:     Effort: Pulmonary effort is normal.  Musculoskeletal:     Cervical back: Normal range of motion and neck supple. No swelling, edema, deformity, erythema, signs of trauma, lacerations, rigidity, spasms, torticollis, tenderness, bony tenderness or crepitus. No pain with movement, spinous process tenderness or muscular tenderness. Normal range of motion.  Lymphadenopathy:     Cervical: No cervical adenopathy.  Skin:    General: Skin is warm and dry.  Neurological:     General: No focal deficit present.     Mental Status: She is alert and oriented to person, place, and time.     Cranial Nerves: No cranial nerve deficit.     Motor: No weakness.     Coordination: Coordination normal.     Gait: Gait normal.     Deep Tendon Reflexes: Reflexes normal.  Psychiatric:        Mood and Affect: Mood normal.        Behavior: Behavior normal.        Thought Content: Thought content normal.        Judgment: Judgment normal.     DG Hand Complete Right Result Date: 09/04/2023 CLINICAL DATA:  Right hand pain after injury EXAM: RIGHT HAND - COMPLETE 3 VIEW COMPARISON:  None Available. FINDINGS: Comminuted intra-articular fractures of the index and middle finger proximal phalangeal bases without substantial displacement of the fracture fragments. Joint spaces remain congruent. Diffuse degenerative changes of the hand and wrist. Soft tissue swelling of the proximal index, middle, and ring fingers. IMPRESSION: Comminuted intra-articular fractures of  the index and middle finger proximal phalangeal bases. Electronically Signed   By: Limin  Xu M.D.   On: 09/04/2023 16:42   Patient placed into a radial gutter splint immobilizing the 2nd and 3rd fingers with the right wrist in slight extension.  Assessment and Plan :   PDMP not reviewed this encounter.  1. Right hand pain   2. Closed nondisplaced fracture of proximal phalanx of right index finger, initial encounter   3. Closed nondisplaced fracture of proximal phalanx of right middle finger, initial encounter  4. Contusion of lip and oral cavity   5. Facial abrasion, initial encounter   6. Accidental fall, initial encounter    Wound care reviewed.  Splinting as above.  Recommend follow-up with [facility] soon as possible.  Scheduled Tylenol .  Use tramadol for breakthrough pain.  Counseled patient on potential for adverse effects with medications prescribed/recommended today, ER and return-to-clinic precautions discussed, patient verbalized understanding.    Adolph Hoop, [place] 09/04/23 1324

## 2023-09-04 NOTE — Discharge Instructions (Signed)
 Please keep the splint on the right hand at all times.  Make sure you keep it clean and dry as best you can.  Follow-up with [facility] as soon as possible. Please schedule Tylenol  at 500 mg - 650 mg once every 6 hours as needed for aches and pains.  If you still have pain despite taking Tylenol  regularly, this is breakthrough pain.  You can use tramadol once every 6 hours for this.  Once your pain is better controlled, switch back to just Tylenol .

## 2023-09-04 NOTE — ED Triage Notes (Signed)
 Patient presents to UC for a fall that happened today. States she tripped and landed on her face. When she fell she landed on her right hand. Has not taking anything for pain. Pain worse with flexion.

## 2023-09-09 DIAGNOSIS — M79641 Pain in right hand: Secondary | ICD-10-CM | POA: Diagnosis not present

## 2023-09-09 DIAGNOSIS — S62642A Nondisplaced fracture of proximal phalanx of right middle finger, initial encounter for closed fracture: Secondary | ICD-10-CM | POA: Diagnosis not present

## 2023-09-09 DIAGNOSIS — S62640A Nondisplaced fracture of proximal phalanx of right index finger, initial encounter for closed fracture: Secondary | ICD-10-CM | POA: Diagnosis not present

## 2023-09-10 DIAGNOSIS — R5383 Other fatigue: Secondary | ICD-10-CM | POA: Diagnosis not present

## 2023-09-10 DIAGNOSIS — M0579 Rheumatoid arthritis with rheumatoid factor of multiple sites without organ or systems involvement: Secondary | ICD-10-CM | POA: Diagnosis not present

## 2023-09-10 DIAGNOSIS — Z79899 Other long term (current) drug therapy: Secondary | ICD-10-CM | POA: Diagnosis not present

## 2023-09-23 ENCOUNTER — Telehealth: Payer: Self-pay

## 2023-09-23 DIAGNOSIS — G4730 Sleep apnea, unspecified: Secondary | ICD-10-CM

## 2023-09-23 DIAGNOSIS — S62642A Nondisplaced fracture of proximal phalanx of right middle finger, initial encounter for closed fracture: Secondary | ICD-10-CM | POA: Diagnosis not present

## 2023-09-23 DIAGNOSIS — R413 Other amnesia: Secondary | ICD-10-CM

## 2023-09-23 DIAGNOSIS — S62640A Nondisplaced fracture of proximal phalanx of right index finger, initial encounter for closed fracture: Secondary | ICD-10-CM | POA: Diagnosis not present

## 2023-09-23 NOTE — Telephone Encounter (Signed)
 Referral has been placed.   Copied from CRM [id number]. Topic: Referral - Request for Referral >> Sep 18, 2023 11:34 AM [name] B wrote: Did the patient discuss referral with their provider in the last year? Yes (If No - schedule appointment) (If Yes - send message)  Appointment offered? Yes  Type of order/referral and detailed reason for visit: neurologist  Preference of office, provider, location: dr Omar Bibber [place]  If referral order, have you been seen by this specialty before? Yes (If Yes, this issue or another issue? When? Where?  Can we respond through MyChart? No

## 2023-10-06 ENCOUNTER — Other Ambulatory Visit: Payer: Self-pay | Admitting: Family

## 2023-10-06 DIAGNOSIS — D509 Iron deficiency anemia, unspecified: Secondary | ICD-10-CM

## 2023-10-07 ENCOUNTER — Inpatient Hospital Stay: Payer: Medicare Other | Admitting: Family

## 2023-10-07 ENCOUNTER — Other Ambulatory Visit

## 2023-10-07 ENCOUNTER — Inpatient Hospital Stay: Payer: Medicare Other | Attending: Family

## 2023-10-15 ENCOUNTER — Ambulatory Visit
Admission: RE | Admit: 2023-10-15 | Discharge: 2023-10-15 | Disposition: A | Discharge status: Home / Self Care | Source: Ambulatory Visit | Attending: Pulmonary Disease | Admitting: Pulmonary Disease

## 2023-10-15 DIAGNOSIS — I700 Atherosclerosis of aorta: Secondary | ICD-10-CM | POA: Diagnosis not present

## 2023-10-15 DIAGNOSIS — R599 Enlarged lymph nodes, unspecified: Secondary | ICD-10-CM | POA: Diagnosis not present

## 2023-10-15 DIAGNOSIS — E042 Nontoxic multinodular goiter: Secondary | ICD-10-CM | POA: Diagnosis not present

## 2023-10-15 DIAGNOSIS — I2510 Atherosclerotic heart disease of native coronary artery without angina pectoris: Secondary | ICD-10-CM | POA: Diagnosis not present

## 2023-10-15 DIAGNOSIS — R590 Localized enlarged lymph nodes: Secondary | ICD-10-CM

## 2023-10-21 DIAGNOSIS — H2513 Age-related nuclear cataract, bilateral: Secondary | ICD-10-CM | POA: Diagnosis not present

## 2023-10-21 DIAGNOSIS — H524 Presbyopia: Secondary | ICD-10-CM | POA: Diagnosis not present

## 2023-10-29 ENCOUNTER — Ambulatory Visit: Payer: Self-pay | Admitting: Pulmonary Disease

## 2023-10-29 DIAGNOSIS — M1991 Primary osteoarthritis, unspecified site: Secondary | ICD-10-CM | POA: Diagnosis not present

## 2023-10-29 DIAGNOSIS — M0579 Rheumatoid arthritis with rheumatoid factor of multiple sites without organ or systems involvement: Secondary | ICD-10-CM | POA: Diagnosis not present

## 2023-10-29 DIAGNOSIS — R5382 Chronic fatigue, unspecified: Secondary | ICD-10-CM | POA: Diagnosis not present

## 2023-11-05 DIAGNOSIS — M0579 Rheumatoid arthritis with rheumatoid factor of multiple sites without organ or systems involvement: Secondary | ICD-10-CM | POA: Diagnosis not present

## 2023-11-05 DIAGNOSIS — Z79899 Other long term (current) drug therapy: Secondary | ICD-10-CM | POA: Diagnosis not present

## 2023-11-05 DIAGNOSIS — R5383 Other fatigue: Secondary | ICD-10-CM | POA: Diagnosis not present

## 2023-11-27 IMAGING — MG MM DIGITAL DIAGNOSTIC UNILAT*R* W/ TOMO W/ CAD
6 series · 6 of 18 positions shown · non-contrast
Comparison: Previous exam(s).

CLINICAL DATA: 70-year-old female recalled from screening mammogram
dated 03/02/2021 for a possible right breast asymmetry.

EXAM:
DIGITAL DIAGNOSTIC UNILATERAL RIGHT MAMMOGRAM WITH TOMOSYNTHESIS AND
CAD
TECHNIQUE: Right digital diagnostic mammography and breast tomosynthesis was
performed. The images were evaluated with computer-aided detection.

[R XCCL synth-2D]
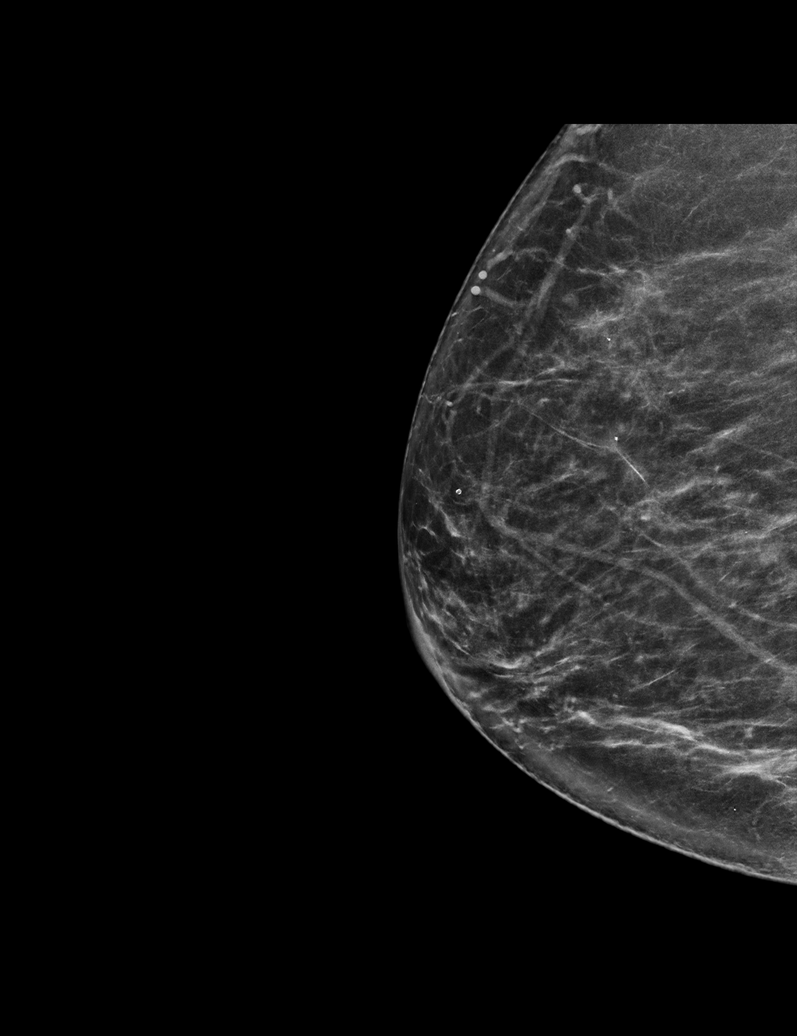

[R CC synth-2D]
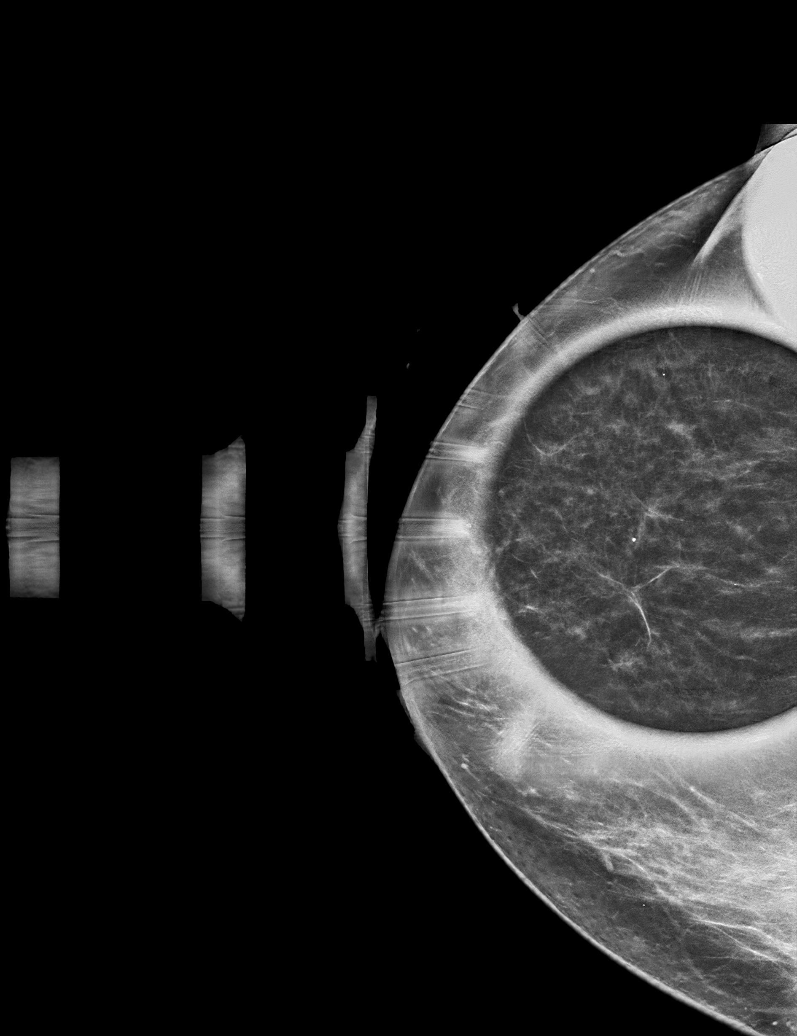

[R ML synth-2D]
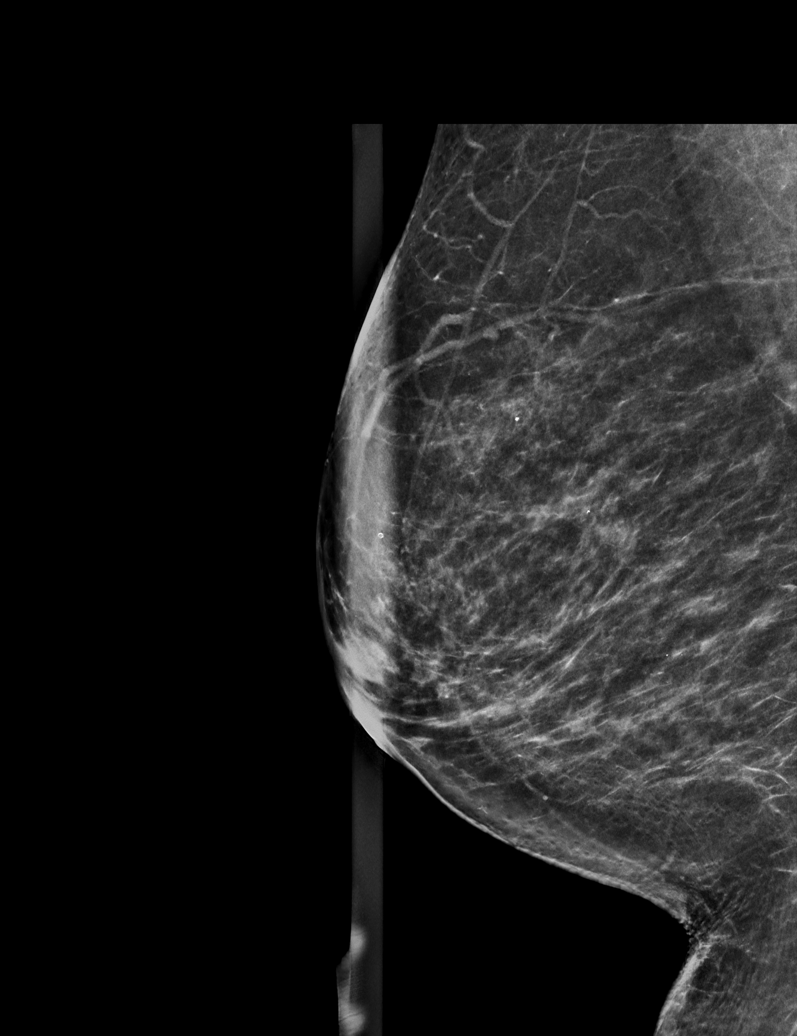

[R CC tomo · tomo slice 31/62.0]
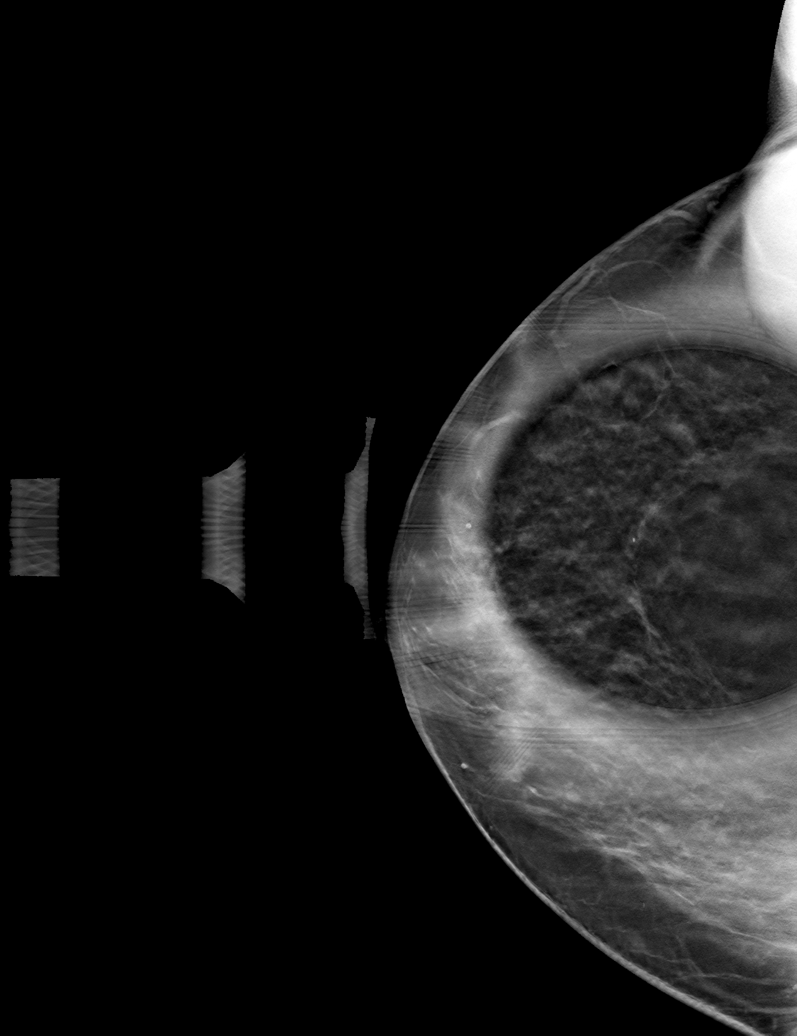

[R ML tomo · tomo slice 35/68.0]
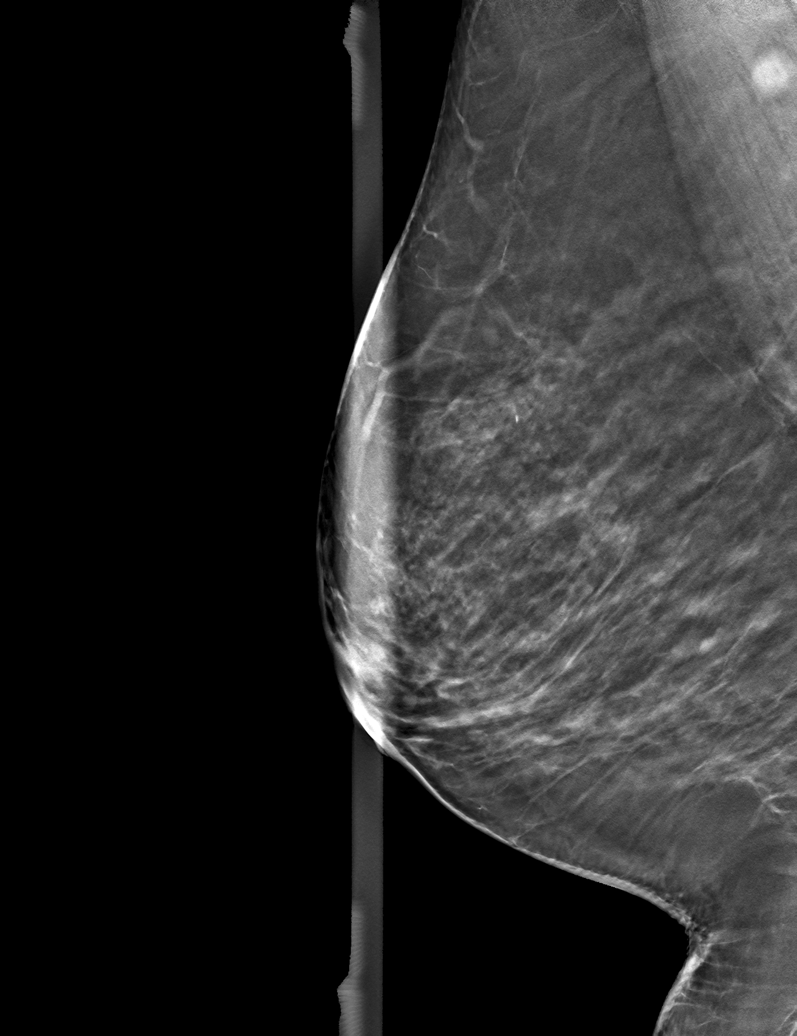

[R XCCL tomo · tomo slice 35/70.0]
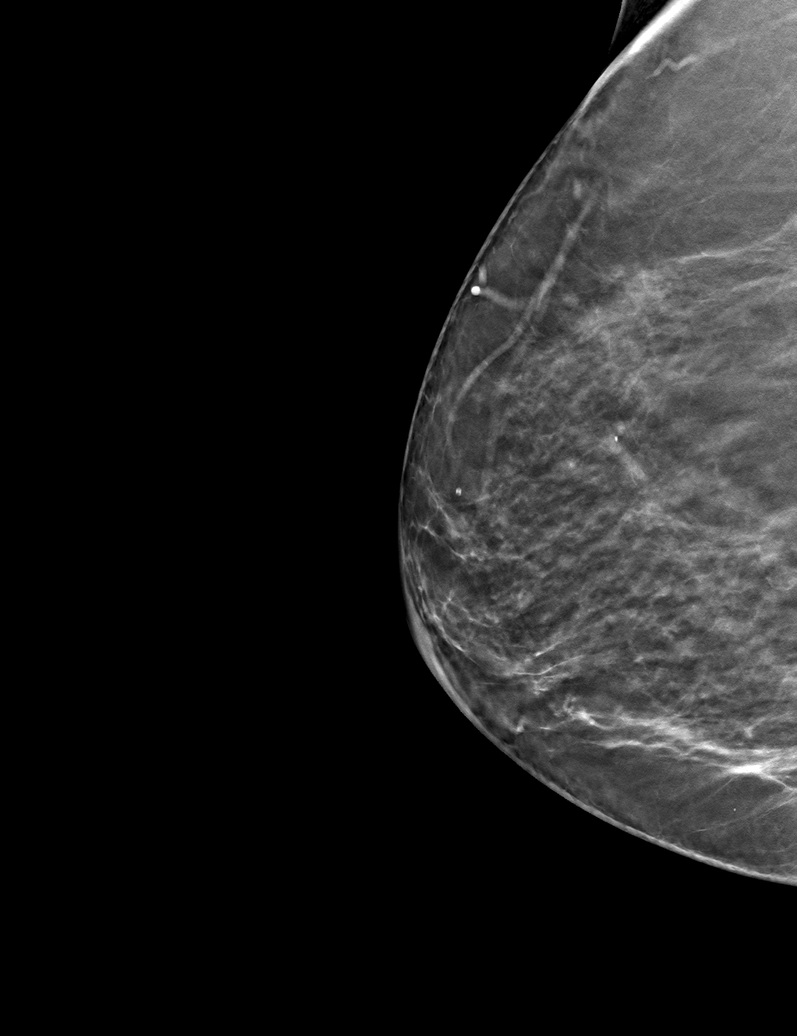

[6 of 18 positions shown; findings below may reference images not displayed]

ACR Breast Density Category c: The breast tissue is heterogeneously
dense, which may obscure small masses.
FINDINGS: Possible asymmetry in the lateral right breast at posterior depth on
the CC projection resolves into well dispersed fibroglandular tissue
on today's additional views. No suspicious findings are identified.
IMPRESSION: No mammographic evidence of malignancy.

RECOMMENDATION:
Screening mammogram in one year.(Code:GH-5-0PY)

I have discussed the findings and recommendations with the patient.
If applicable, a reminder letter will be sent to the patient
regarding the next appointment.

BI-RADS CATEGORY  1: Negative.

## 2023-11-30 ENCOUNTER — Other Ambulatory Visit: Payer: Self-pay

## 2023-11-30 ENCOUNTER — Other Ambulatory Visit: Payer: Medicare Other

## 2023-11-30 DIAGNOSIS — G4733 Obstructive sleep apnea (adult) (pediatric): Secondary | ICD-10-CM | POA: Diagnosis not present

## 2023-11-30 DIAGNOSIS — E7800 Pure hypercholesterolemia, unspecified: Secondary | ICD-10-CM | POA: Diagnosis not present

## 2023-11-30 DIAGNOSIS — Z0000 Encounter for general adult medical examination without abnormal findings: Secondary | ICD-10-CM

## 2023-11-30 DIAGNOSIS — E039 Hypothyroidism, unspecified: Secondary | ICD-10-CM | POA: Diagnosis not present

## 2023-11-30 DIAGNOSIS — I10 Essential (primary) hypertension: Secondary | ICD-10-CM | POA: Diagnosis not present

## 2023-11-30 DIAGNOSIS — Z23 Encounter for immunization: Secondary | ICD-10-CM

## 2023-11-30 MED ORDER — ROSUVASTATIN CALCIUM 10 MG PO TABS: 10.0000 mg | ORAL_TABLET | Freq: Every day | ORAL | 3 refills | Status: DC

## 2023-12-01 ENCOUNTER — Ambulatory Visit: Payer: Self-pay | Admitting: Internal Medicine

## 2023-12-02 LAB — COMPLETE METABOLIC PANEL WITHOUT GFR
AG Ratio: 1.5 (calc) (ref 1.0–2.5)
ALT: 20 U/L (ref 6–29)
AST: 22 U/L (ref 10–35)
Albumin: 4.2 g/dL (ref 3.6–5.1)
Alkaline phosphatase (APISO): 97 U/L (ref 37–153)
BUN: 16 mg/dL (ref 7–25)
CO2: 26 mmol/L (ref 20–32)
Calcium: 9.9 mg/dL (ref 8.6–10.4)
Chloride: 105 mmol/L (ref 98–110)
Creat: 0.65 mg/dL (ref 0.60–1.00)
Globulin: 2.8 g/dL (ref 1.9–3.7)
Glucose, Bld: 89 mg/dL (ref 65–99)
Potassium: 3.6 mmol/L (ref 3.5–5.3)
Sodium: 140 mmol/L (ref 135–146)
Total Bilirubin: 0.9 mg/dL (ref 0.2–1.2)
Total Protein: 7 g/dL (ref 6.1–8.1)

## 2023-12-02 LAB — LIPID PANEL
Cholesterol: 504 mg/dL — ABNORMAL HIGH (ref <200)
HDL: 73 mg/dL (ref >=50)
LDL Cholesterol (Calc): >350 mg/dL — ABNORMAL HIGH
Non-HDL Cholesterol (Calc): 431 mg/dL — ABNORMAL HIGH (ref <130)
Total CHOL/HDL Ratio: 6.9 (calc) — ABNORMAL HIGH (ref <5.0)
Triglycerides: 113 mg/dL (ref <150)

## 2023-12-02 LAB — CBC WITH DIFFERENTIAL/PLATELET
Absolute Lymphocytes: 2993 {cells}/uL (ref 850–3900)
Absolute Monocytes: 547 {cells}/uL (ref 200–950)
Basophils Absolute: 51 {cells}/uL (ref 0–200)
Basophils Relative: 0.9 %
Eosinophils Absolute: 80 {cells}/uL (ref 15–500)
Eosinophils Relative: 1.4 %
HCT: 38.3 % (ref 35.0–45.0)
Hemoglobin: 11.9 g/dL (ref 11.7–15.5)
MCH: 30.7 pg (ref 27.0–33.0)
MCHC: 31.1 g/dL — ABNORMAL LOW (ref 32.0–36.0)
MCV: 98.7 fL (ref 80.0–100.0)
MPV: 11.2 fL (ref 7.5–12.5)
Monocytes Relative: 9.6 %
Neutro Abs: 2029 {cells}/uL (ref 1500–7800)
Neutrophils Relative %: 35.6 %
Platelets: 284 Thousand/uL (ref 140–400)
RBC: 3.88 Million/uL (ref 3.80–5.10)
RDW: 11.5 % (ref 11.0–15.0)
Total Lymphocyte: 52.5 %
WBC: 5.7 Thousand/uL (ref 3.8–10.8)

## 2023-12-02 LAB — TSH: TSH: 2.02 m[IU]/L (ref 0.40–4.50)

## 2023-12-07 ENCOUNTER — Ambulatory Visit: Payer: Medicare Other | Admitting: Internal Medicine

## 2023-12-17 ENCOUNTER — Telehealth: Payer: Self-pay

## 2023-12-17 NOTE — Telephone Encounter (Signed)
 Copied from CRM [id number]. Topic: Referral - Question >> Dec 16, 2023  3:55 PM Deleta RAMAN wrote: Reason for CRM: patient daughter is calling for a referral to be sent somewhere besides the previous neurologist. Please contact [facility] [phone]. Patient aware of call back time

## 2023-12-25 ENCOUNTER — Other Ambulatory Visit: Payer: Self-pay

## 2023-12-25 DIAGNOSIS — R413 Other amnesia: Secondary | ICD-10-CM

## 2023-12-28 ENCOUNTER — Other Ambulatory Visit: Payer: Self-pay

## 2023-12-28 MED ORDER — ROSUVASTATIN CALCIUM 10 MG PO TABS: 10.0000 mg | ORAL_TABLET | Freq: Every day | ORAL | 3 refills | Status: DC

## 2023-12-31 ENCOUNTER — Encounter: Payer: Self-pay | Admitting: Internal Medicine

## 2023-12-31 ENCOUNTER — Ambulatory Visit (INDEPENDENT_AMBULATORY_CARE_PROVIDER_SITE_OTHER): Admitting: Internal Medicine

## 2023-12-31 VITALS — BP 130/80 | HR 60 | Ht 61.75 in | Wt 144.0 lb

## 2023-12-31 DIAGNOSIS — M0579 Rheumatoid arthritis with rheumatoid factor of multiple sites without organ or systems involvement: Secondary | ICD-10-CM

## 2023-12-31 DIAGNOSIS — E042 Nontoxic multinodular goiter: Secondary | ICD-10-CM

## 2023-12-31 DIAGNOSIS — Z8639 Personal history of other endocrine, nutritional and metabolic disease: Secondary | ICD-10-CM

## 2023-12-31 DIAGNOSIS — E7800 Pure hypercholesterolemia, unspecified: Secondary | ICD-10-CM | POA: Diagnosis not present

## 2023-12-31 DIAGNOSIS — R413 Other amnesia: Secondary | ICD-10-CM | POA: Diagnosis not present

## 2023-12-31 DIAGNOSIS — F32A Depression, unspecified: Secondary | ICD-10-CM

## 2023-12-31 DIAGNOSIS — I10 Essential (primary) hypertension: Secondary | ICD-10-CM

## 2023-12-31 DIAGNOSIS — E041 Nontoxic single thyroid nodule: Secondary | ICD-10-CM | POA: Diagnosis not present

## 2023-12-31 DIAGNOSIS — G4733 Obstructive sleep apnea (adult) (pediatric): Secondary | ICD-10-CM | POA: Diagnosis not present

## 2023-12-31 DIAGNOSIS — E039 Hypothyroidism, unspecified: Secondary | ICD-10-CM | POA: Diagnosis not present

## 2023-12-31 DIAGNOSIS — Z0000 Encounter for general adult medical examination without abnormal findings: Secondary | ICD-10-CM

## 2023-12-31 DIAGNOSIS — F419 Anxiety disorder, unspecified: Secondary | ICD-10-CM

## 2023-12-31 MED ORDER — ROSUVASTATIN CALCIUM 20 MG PO TABS: 20.0000 mg | ORAL_TABLET | Freq: Every day | ORAL | 3 refills | Status: DC

## 2023-12-31 NOTE — Progress Notes (Signed)
 Annual Wellness Visit   Patient Care Team: Perri Ronal PARAS, MD as PCP - General (Internal Medicine)  Visit Date: 12/31/23   Chief Complaint  Patient presents with   Annual Exam   Medicare Wellness   Subjective:  Patient: [name], Female DOB: 02-03-1951, 73 y.o. MRN: [id number] Vitals:   12/31/23 1410  BP: 130/80   [name] is a 73 y.o. Female who presents today for her Annual Wellness Visit. Patient has Hypertension; Hyperlipidemia; Sleep apnea; History of iron  deficiency anemia; Osteoarthritis; Benign and innocent cardiac murmurs; Anxiety; Impaired glucose tolerance; Metabolic syndrome; Depression; and IDA (iron  deficiency anemia) on their problem list.    History of Hyperlipidemia treated by rosuvastatin  10 mg. 11/30/23 CHOL 504,LDL >350,Total CHOL/LDL ratio, Non-HDL CHOL 431 otherwise WNL  Rosuvastatin  increased to 20 mg.   History of Hypertension treated with lisinopril  10 mg daily, Triamterene -Hydrochlorthiazide 37.5 mg daily  Blood pressure today is normal at 130/80  History of anxiety and depression treated with alprazolam  0.5 mg twice daily as needed and sertraline  100 mg daily    11/10/22 US  thyroid  showed the following: 1) Findings suggestive of multinodular goiter, 2) Nodule labeled #1 likely correlates with the hypermetabolic nodule demonstrated on preceding PET-CT and as such could undergo ultrasound-guided fine-needle aspiration as indicated, 3) 1.9 cm nodule meets imaging criteria to recommend a 1 year follow-up. 01/07/2023 US  FNA BX Thyroid  impression:Technically successful ultrasound guided fine needle aspiration of previously described peripherally calcified nodule #1 in the mid right thyroid  lobe.     She has a longstanding history of iron  deficiency. Not taking iron  supplement.  Seen at Hematology for iron  deficiency.  Ferritin is 1,229 on 11/21/22 and is an acute phase reactant and is always high in this patient.  This is likely due to rheumatoid  arthritis.   Patient does have a history of memory issues.   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   Was initially diagnosed with mild cognitive impairment by Dr. Zelson, Neuropsychologist in December 2016  and was  started on Zoloft  at that time for depression.  She had previous MRI of the brain with and without contrast showing no tumor or stroke.  Had mild periventricular and subcortical foci of nonspecific gliosis.   Followed by Dr. Ishmael, rheumatologist, for joint pain and arthralgias and has positive ANA and CCP.  ANA titer was 1: 320 and pattern was nuclear/speckled.  CCP was greater than 250 and rheumatoid factor was positive.  Rheumatoid arthritis was diagnosed. She is now taking leflunomide 20 mg daily.   Had allergy testing in 2007 by Dr. Frutoso which was negative.  She saw ENT physician at that time for chronic sinusitis and was thought to have GE reflux.  CT of the sinuses was normal.   Remote history of iron  deficiency anemia related to menorrhagia but that resolved with menopause.   Ganglion cyst removed from left hand 1999.  Bilateral tubal ligation 1993.  Thrombosed hemorrhoid excised by Dr. Gail in 2005.   Had an episode of chest pain in 11/23. No heart abnormalities found.    Followed by pulmonologist, Dr. Theophilus, for bilateral axillary lymphadenopathy. CT chest 10/15/2023 :Bilateral axillary and mediastinal subcentimeter lymph nodes likely reactive, stable to prior. Atherosclerotic calcifications of aorta and coronary arteries.Nodular thyroid .  Correlate with ultrasound findings.      Mammogram last completed 06/11/2023. No mammographic evidence of malignancy. Recommended repeat in 2026.   Colonoscopy 02/19/23  Impression: the examined portion of the ileum  was normal. Two 3 to 6 mm polyps in the cecum, removed with a cold snare. Resected and retrieved. Non- bleeding internal hemorrhoids. Return in one year.    Labs 11/30/23 CHOL 504  LDL >350     Total CHOL/LDL ratio  Non-HDL CHOL 431 MCHC 31.1  Otherwise WNL   Vaccine counseling: Influenza  and Tetanus vaccine due   Health Maintenance: had eye exam.     Health Maintenance  Topic Date Due   DTaP/Tdap/Td (3 - Td or Tdap) 12/25/2022   Influenza Vaccine  11/20/2023   Medicare Annual Wellness (AWV)  12/30/2024   Mammogram  06/07/2025   Colonoscopy  02/18/2030   Pneumococcal Vaccine: 50+ Years  Completed   HPV VACCINES  Aged Out   Meningococcal B Vaccine  Aged Out   DEXA SCAN  Discontinued   COVID-19 Vaccine  Discontinued   Hepatitis C Screening  Discontinued   Zoster Vaccines- Shingrix  Discontinued    ROS Objective:  Vitals: body mass index is 26.55 kg/m. Today's Vitals   12/31/23 1410 12/31/23 1428  BP: 130/80   Pulse: 60   SpO2: 98%   Weight: 144 lb (65.3 kg)   Height: 5' 1.75 (1.568 m)   PainSc: 0-No pain 0-No pain   Physical Exam Vitals and nursing note reviewed.  Constitutional:      General: She is not in acute distress.    Appearance: Normal appearance. She is not ill-appearing or toxic-appearing.  HENT:     Head: Normocephalic and atraumatic.     Right Ear: Hearing, tympanic membrane, ear canal and external ear normal.     Left Ear: Hearing, tympanic membrane, ear canal and external ear normal.     Mouth/Throat:     Pharynx: Oropharynx is clear.  Eyes:     Extraocular Movements: Extraocular movements intact.     Pupils: Pupils are equal, round, and reactive to light.  Neck:     Thyroid : No thyroid  mass, thyromegaly or thyroid  tenderness.     Vascular: No carotid bruit.     Comments: No superclavicu Cardiovascular:     Rate and Rhythm: Normal rate and regular rhythm. No extrasystoles are present.    Pulses:          Dorsalis pedis pulses are 2+ on the right side and 2+ on the left side.     Heart sounds: Normal heart sounds. No murmur heard.    No friction rub. No gallop.  Pulmonary:     Effort: Pulmonary effort is normal.     Breath  sounds: Normal breath sounds. No decreased breath sounds, wheezing, rhonchi or rales.  Chest:     Chest wall: No mass.  Abdominal:     Palpations: Abdomen is soft. There is no hepatomegaly, splenomegaly or mass.     Tenderness: There is no abdominal tenderness.     Hernia: No hernia is present.  Musculoskeletal:     Cervical back: Normal range of motion.     Right lower leg: No edema.     Left lower leg: No edema.  Lymphadenopathy:     Cervical: No cervical adenopathy.     Upper Body:     Right upper body: No supraclavicular adenopathy.     Left upper body: No supraclavicular adenopathy.  Skin:    General: Skin is warm and dry.  Neurological:     General: No focal deficit present.     Mental Status: She is alert and oriented to person, place, and time.  Mental status is at baseline.     Sensory: Sensation is intact.     Motor: Motor function is intact. No weakness.     Deep Tendon Reflexes: Reflexes are normal and symmetric.  Psychiatric:        Attention and Perception: Attention normal.        Mood and Affect: Mood normal.        Speech: Speech normal.        Behavior: Behavior normal.        Thought Content: Thought content normal.        Cognition and Memory: Cognition normal.        Judgment: Judgment normal.   No superclavicular adenopathy   Current Outpatient Medications  Medication Instructions   acetaminophen  (TYLENOL ) 650 mg, Oral, Every 6 hours PRN   ALPRAZolam  (XANAX ) 0.5 mg, Oral, 2 times daily PRN   bacitracin  ointment 1 Application, Topical, 2 times daily   leflunomide (ARAVA) 20 mg, Daily   levothyroxine  (SYNTHROID ) 50 mcg, Oral, Daily   lisinopril  (ZESTRIL ) 10 mg, Oral, Daily   Multiple Vitamins-Minerals (MULTIVITAMIN WITH MINERALS) tablet 1 tablet, Daily   Omega-3 1000 MG CAPS Daily   pantoprazole  (PROTONIX ) 40 mg, Oral, Daily   rosuvastatin  (CRESTOR ) 20 mg, Oral, Daily   sertraline  (ZOLOFT ) 100 mg, Oral, Daily   traMADol  (ULTRAM ) 50 mg, Oral, Every 6  hours PRN   triamterene -hydrochlorothiazide  (MAXZIDE-25) 37.5-25 MG tablet 1 tablet, Oral, Daily   Past Medical History:  Diagnosis Date   Anemia    Arthritis    Cognitive deficits    Hyperlipidemia    Hypertension    Memory loss or impairment    Pre-diabetes    Sleep apnea    Medical/Surgical History Narrative:  Allergic/Intolerant to: No Known Allergies  Past Surgical History:  Procedure Laterality Date   COLONOSCOPY  10-Feb-2010   COLONOSCOPY WITH ESOPHAGOGASTRODUODENOSCOPY (EGD)  02/19/2023   [facility], gastritis   GANGLION CYST EXCISION     l hand   JOINT REPLACEMENT     knee right   TUBAL LIGATION     Family History  Problem Relation Age of Onset   Diabetes Mother    Hypertension Mother    Kidney disease Father    Heart disease Father    Colon cancer Sister    Stroke Sister    Liver disease Neg Hx    Esophageal cancer Neg Hx     Family history Narrative  Father with history of hypertension died of renal failure and congestive heart failure. He also had a history of subdural hematoma. He was 72 years old when he died. Mother died at age 63 of diabetes and hypertension. 1 brother with history of hypertension on dialysis. 2 sisters-1 of whom is living with history of colon cancer. Younger sister died in 1995/02/11 of cerebral hemorrhage.     Social History Narrative  Married. Has adult children. Formerly worked for [employer]. Husband is a retired Air traffic controller Risks Assessment:   Medicare Risk at Home - 12/31/23 1404     Any stairs in or around the home? Yes    If so, are there any without handrails? No    Home free of loose throw rugs in walkways, pet beds, electrical cords, etc? Yes    Adequate lighting in your home to reduce risk of falls? Yes    Life alert? No    Use of a cane, walker or w/c? No  Grab bars in the bathroom? Yes    Shower chair or bench in shower? Yes    Elevated toilet seat or a handicapped toilet? No          Most Recent Social Determinants of Health (Including Hx of Tobacco, Alcohol, and Drug Use) SDOH Screenings   Food Insecurity: No Food Insecurity (12/31/2023)  Housing: Low Risk  (12/04/2022)  Transportation Needs: No Transportation Needs (12/31/2023)  Utilities: Not At Risk (12/31/2023)  Alcohol Screen: Low Risk  (12/31/2023)  Depression (PHQ2-9): Low Risk  (12/31/2023)  Financial Resource Strain: Low Risk  (12/31/2023)  Physical Activity: Sufficiently Active (12/31/2023)  Social Connections: Socially Integrated (12/31/2023)  Stress: Stress Concern Present (12/31/2023)  Tobacco Use: Low Risk  (12/31/2023)  Health Literacy: Inadequate Health Literacy (12/31/2023)   Social History   Tobacco Use   Smoking status: Never   Smokeless tobacco: Never  Vaping Use   Vaping status: Never Used  Substance Use Topics   Alcohol use: Yes    Comment: rarely   Drug use: No   Most Recent Functional Status Assessment:    12/31/2023    2:02 PM  In your present state of health, do you have any difficulty performing the following activities:  Hearing? 0  Vision? 0  Difficulty concentrating or making decisions? 1  Walking or climbing stairs? 0  Dressing or bathing? 0  Doing errands, shopping? 1  Preparing Food and eating ? Y  Using the Toilet? N  In the past six months, have you accidently leaked urine? N  Do you have problems with loss of bowel control? Y  Managing your Medications? N  Managing your Finances? Y  Housekeeping or managing your Housekeeping? N   Most Recent Fall Risk Assessment:    12/31/2023    2:05 PM  Fall Risk   Falls in the past year? 1  Number falls in past yr: 1  Injury with Fall? 1  Risk for fall due to : Other (Comment)  Follow up Falls evaluation completed;Education provided   Most Recent Anxiety/Depression Screenings:    12/31/2023    2:29 PM 12/04/2022   10:50 AM  PHQ 2/9 Scores  PHQ - 2 Score 0 2  PHQ- 9 Score  4       No data to display         Most  Recent Cognitive Screening:    12/31/2023    2:11 PM  6CIT Screen  What Year? 4 points  What month? 3 points  What time? 3 points  Count back from 20 4 points  Months in reverse 0 points  Repeat phrase 0 points  Total Score 14 points   Most Recent Vision/Hearing Screenings:No results found. Results:  Studies Obtained And Personally Reviewed By Me: Diabetic Foot Exam - Simple   No data filed     Mammogram last completed 06/06/22. No mammographic evidence of malignancy. Recommended repeat in 2025.   Colonoscopy 02/19/23  Impression: the examined portion of the ileum was normal. Two 3 to 6 mm polyps in the cecum, removed with a cold snare. Resected and retrieved. Non- bleeding internal hemorrhoids. Return in one year.    Labs:  CBC w/ Differential Lab Results  Component Value Date   WBC 5.7 11/30/2023   RBC 3.88 11/30/2023   HGB 11.9 11/30/2023   HCT 38.3 11/30/2023   PLT 284 11/30/2023   MCV 98.7 11/30/2023   MCH 30.7 11/30/2023   MCHC 31.1 (L) 11/30/2023   RDW 11.5 11/30/2023  MPV 11.2 11/30/2023   LYMPHSABS 2.4 04/08/2023   MONOABS 0.6 04/08/2023   BASOSABS 51 11/30/2023    Comprehensive Metabolic Panel Lab Results  Component Value Date   NA 140 11/30/2023   K 3.6 11/30/2023   CL 105 11/30/2023   CO2 26 11/30/2023   GLUCOSE 89 11/30/2023   BUN 16 11/30/2023   CREATININE 0.65 11/30/2023   CALCIUM  9.9 11/30/2023   PROT 7.0 11/30/2023   ALBUMIN 3.9 08/07/2020   AST 22 11/30/2023   ALT 20 11/30/2023   ALKPHOS 113 08/07/2020   BILITOT 0.9 11/30/2023   EGFR 95 11/25/2021   GFRNONAA >60 03/07/2022   Lipid Panel  Lab Results  Component Value Date   CHOL 504 (H) 11/30/2023   HDL 73 11/30/2023   LDLCALC >350 (H) 11/30/2023   TRIG 113 11/30/2023   A1c Lab Results  Component Value Date   HGBA1C 5.2 11/21/2022    TSH Lab Results  Component Value Date   TSH 2.02 11/30/2023    Assessment & Plan:   Orders Placed This Encounter  Procedures    Ambulatory referral to Neurology    Referral Priority:   Routine    Referral Type:   Consultation    Referral Reason:   Patient Preference    Referred to Provider:   Arminda Prentice Rush, MD    Requested Specialty:   Neurology    Number of Visits Requested:   1   Meds ordered this encounter  Medications   rosuvastatin  (CRESTOR ) 20 MG tablet    Sig: Take 1 tablet (20 mg total) by mouth daily.    Dispense:  90 tablet    Refill:  3   Hyperlipidemia: treated by rosuvastatin  10 mg. 11/30/23 CHOL 504,LDL >350,Total CHOL/LDL ratio, Non-HDL CHOL 431 otherwise WNL  Rosuvastatin  increased to 20 mg.   Hypertension: treated with lisinopril  10 mg daily, Triamterene -Hydrochlorthiazide 37.5 mg daily  Blood pressure today is normal at 130/80  Anxiety and depression: treated with alprazolam  0.5 mg twice daily as needed and sertraline  100 mg daily    Goiter: 01/07/2023 US  FNA BX Thyroid  impression:Technically successful ultrasound guided fine needle aspiration of previously described peripherally calcified nodule #1 in the mid right thyroid  lobe.    Thyroid  nodules- referring to Endocrinologist for evaluation and further recommendations. See ultrasound and Bx report from 2024.  Iron  deficiency: Not taking iron  supplement.  Seen at Hematology for iron  deficiency.  Ferritin is 1,229 on 11/21/22 and is an acute phase reactant and is always high in this patient.  This is likely due to rheumatoid arthritis.   Memory loss: Referred to [facility]    Rheumatoid arthritis: multiple sites treated with Simponi  amd meloxicam . Followed by Dr. Ishmael, Rheumatologist.   Mammogram last completed 06/11/2023. No mammographic evidence of malignancy. Recommended repeat in 2026.   Colonoscopy 02/19/23  Impression: the examined portion of the ileum was normal. Two 3 to 6 mm polyps in the cecum, removed with a cold snare. Resected and retrieved. Non- bleeding internal hemorrhoids. Return in one year.    Labs  11/30/23 CHOL 504  LDL >350    Total CHOL/LDL ratio  Non-HDL CHOL 431 MCHC 31.1  Otherwise WNL   Vaccine counseling: Influenza and Tetanus vaccine due   Health Maintenance: Has had an eye exam.       Daughter has expressed desire for patient to be seen by memory specialist. Making referral to [facility].Husband is with patient today. Patient has been going to  an adult day care program now for several months. She is alert and cooperative but I do not think remembers coming here as a patient for many years.Currently on Zoloft  with hx of depression. Previously patient and family has not wanted medication for memory loss.    Annual Wellness Visit done today including the all of the following: Reviewed patient's Family Medical History Reviewed patient's SDOH and reviewed tobacco, alcohol, and drug use.  Reviewed and updated list of patient's medical providers Assessment of cognitive impairment was done Assessed patient's functional ability Established a written schedule for health screening services Health Risk Assessent Completed and Reviewed  Discussed health benefits of physical activity, and encouraged her to engage in regular exercise appropriate for her age and condition.    I,Makayla C Reid,acting as a scribe for Ronal JINNY Hailstone, MD.,have documented all relevant documentation on the behalf of Ronal JINNY Hailstone, MD,as directed by  Ronal JINNY Hailstone, MD while in the presence of Ronal JINNY Hailstone, MD.    I, Ronal JINNY Hailstone, MD, have reviewed all documentation for and agree with the above Annual Wellness Visit documentation.  Ronal JINNY Hailstone, MD Internal Medicine 12/31/2023

## 2023-12-31 NOTE — Progress Notes (Addendum)
 Subjective:   [name] is a 73 y.o. female who presents for Medicare Annual (Subsequent) preventive examination.  Visit Complete: In person  Patient Medicare AWV questionnaire was completed by the patient on 12/31/2023; I have confirmed that all information answered by patient is correct and no changes since this date.  Cardiac Risk Factors include: advanced age (>11men, >47 women);dyslipidemia;hypertension     Objective:    Today's Vitals   12/31/23 1410 12/31/23 1428  BP: 130/80   Pulse: 60   SpO2: 98%   Weight: 144 lb (65.3 kg)   Height: 5' 1.75 (1.568 m)   PainSc: 0-No pain 0-No pain   Body mass index is 26.55 kg/m.     12/31/2023    2:05 PM 04/08/2023   11:02 AM 12/04/2022   10:58 AM 10/08/2022   10:47 AM 04/08/2022   10:07 AM 03/07/2022    4:21 PM 12/12/2021   10:57 AM  Advanced Directives  Does Patient Have a Medical Advance Directive? Yes Yes Yes Yes Yes No Yes  Type of Advance Directive Living will;Healthcare Power of [employer] Power of [place];Living will Living will;Healthcare Power of [employer] Power of [place];Living will Healthcare Power of [place];Living will  Living will;Healthcare Power of Attorney  Does patient want to make changes to medical advance directive?  No - Patient declined       Copy of Healthcare Power of Attorney in Chart? Yes - validated most recent copy scanned in chart (See row information) No - copy requested No - copy requested No - copy requested No - copy requested    Would patient like information on creating a medical advance directive?      No - Patient declined     Current Medications (verified) Outpatient Encounter Medications as of 12/31/2023  Medication Sig   rosuvastatin  (CRESTOR ) 20 MG tablet Take 1 tablet (20 mg total) by mouth daily.   acetaminophen  (TYLENOL ) 325 MG tablet Take 2 tablets (650 mg total) by mouth every 6 (six) hours as needed for moderate pain (pain score 4-6).   ALPRAZolam   (XANAX ) 0.5 MG tablet Take 1 tablet (0.5 mg total) by mouth 2 (two) times daily as needed for anxiety.   bacitracin  ointment Apply 1 Application topically 2 (two) times daily.   leflunomide (ARAVA) 20 MG tablet Take 20 mg by mouth daily.   levothyroxine  (SYNTHROID ) 50 MCG tablet Take 1 tablet (50 mcg total) by mouth daily. (Patient not taking: Reported on 06/15/2023)   lisinopril  (ZESTRIL ) 10 MG tablet Take 1 tablet (10 mg total) by mouth daily.   Multiple Vitamins-Minerals (MULTIVITAMIN WITH MINERALS) tablet Take 1 tablet by mouth daily.   Omega-3 1000 MG CAPS Take by mouth daily.   pantoprazole  (PROTONIX ) 40 MG tablet TAKE 1 TABLET BY MOUTH DAILY (Patient not taking: Reported on 06/15/2023)   sertraline  (ZOLOFT ) 100 MG tablet Take 1 tablet (100 mg total) by mouth daily.   traMADol  (ULTRAM ) 50 MG tablet Take 1 tablet (50 mg total) by mouth every 6 (six) hours as needed.   triamterene -hydrochlorothiazide  (MAXZIDE-25) 37.5-25 MG tablet Take 1 tablet by mouth daily.   [DISCONTINUED] rosuvastatin  (CRESTOR ) 10 MG tablet Take 1 tablet (10 mg total) by mouth daily.   [DISCONTINUED] rosuvastatin  (CRESTOR ) 10 MG tablet Take 1 tablet (10 mg total) by mouth daily.   No facility-administered encounter medications on file as of 12/31/2023.    Allergies (verified) Patient has no known allergies.   History: Past Medical History:  Diagnosis Date  Anemia    Arthritis    Cognitive deficits    Hyperlipidemia    Hypertension    Memory loss or impairment    Pre-diabetes    Sleep apnea    Past Surgical History:  Procedure Laterality Date   COLONOSCOPY  2011   COLONOSCOPY WITH ESOPHAGOGASTRODUODENOSCOPY (EGD)  02/19/2023   [facility] at [facility], gastritis   GANGLION CYST EXCISION     l hand   JOINT REPLACEMENT     knee right   TUBAL LIGATION     Family History  Problem Relation Age of Onset   Diabetes Mother    Hypertension Mother    Kidney disease Father    Heart disease Father    Colon  cancer Sister    Stroke Sister    Liver disease Neg Hx    Esophageal cancer Neg Hx    Social History   Socioeconomic History   Marital status: Married    Spouse name: Not on file   Number of children: 2   Years of education: Not on file   Highest education level: Not on file  Occupational History   Occupation: retired  Tobacco Use   Smoking status: Never   Smokeless tobacco: Never  Vaping Use   Vaping status: Never Used  Substance and Sexual Activity   Alcohol use: Yes    Comment: rarely   Drug use: No   Sexual activity: Not on file  Other Topics Concern   Not on file  Social History Narrative   Not on file   Social Drivers of Health   Financial Resource Strain: Low Risk  (12/31/2023)   Overall Financial Resource Strain (CARDIA)    Difficulty of Paying Living Expenses: Not hard at all  Food Insecurity: No Food Insecurity (12/31/2023)   Hunger Vital Sign    Worried About Running Out of Food in the Last Year: Never true    Ran Out of Food in the Last Year: Never true  Transportation Needs: No Transportation Needs (12/31/2023)   PRAPARE - Administrator, Civil Service (Medical): No    Lack of Transportation (Non-Medical): No  Physical Activity: Sufficiently Active (12/31/2023)   Exercise Vital Sign    Days of Exercise per Week: 4 days    Minutes of Exercise per Session: 40 min  Stress: Stress Concern Present (12/31/2023)   [employer] of Occupational Health - Occupational Stress Questionnaire    Feeling of Stress: To some extent  Social Connections: Socially Integrated (12/31/2023)   Social Connection and Isolation Panel    Frequency of Communication with Friends and Family: Twice a week    Frequency of Social Gatherings with Friends and Family: Twice a week    Attends Religious Services: 1 to 4 times per year    Active Member of [employer] or Organizations: Yes    Attends Banker Meetings: 1 to 4 times per year    Marital Status: Married     Tobacco Counseling Counseling given: No   Clinical Intake:  Pre-visit preparation completed: Yes  Pain : No/denies pain Pain Score: 0-No pain     BMI - recorded: 26.55 Nutritional Status: BMI 25 -29 Overweight Nutritional Risks: None Diabetes: No  How often do you need to have someone help you when you read instructions, pamphlets, or other written materials from your doctor or pharmacy?: 5 - Always  Interpreter Needed?: No  Information entered by :: Kathlynn Porto, CMA   Activities of Daily Living  12/31/2023    2:02 PM  In your present state of health, do you have any difficulty performing the following activities:  Hearing? 0  Vision? 0  Difficulty concentrating or making decisions? 1  Walking or climbing stairs? 0  Dressing or bathing? 0  Doing errands, shopping? 1  Preparing Food and eating ? Y  Using the Toilet? N  In the past six months, have you accidently leaked urine? N  Do you have problems with loss of bowel control? Y  Managing your Medications? N  Managing your Finances? Y  Housekeeping or managing your Housekeeping? N    Patient Care Team: Perri Ronal PARAS, MD as PCP - General (Internal Medicine)  Indicate any recent Medical Services you may have received from other than [facility] providers in the past year (date may be approximate).     Assessment:   This is a routine wellness examination for Arnetha.  Hearing/Vision screen No results found.   Goals Addressed   None    Depression Screen    12/31/2023    2:29 PM 12/04/2022   10:50 AM 05/29/2022   10:36 AM 11/26/2021   10:56 AM 05/28/2021   11:33 AM 11/23/2020   11:20 AM 05/16/2020    6:07 PM  PHQ 2/9 Scores  PHQ - 2 Score 0 2 0 3 0 1 1  PHQ- 9 Score  4  8 0 1     Fall Risk    12/31/2023    2:05 PM 12/04/2022   10:59 AM 05/29/2022   10:36 AM 11/26/2021   10:56 AM 05/28/2021   11:33 AM  Fall Risk   Falls in the past year? 1 0 0 0 0  Number falls in past yr: 1 0 0 0 0  Injury with Fall?  1 0 0 0 0  Risk for fall due to : Other (Comment) No Fall Risks No Fall Risks No Fall Risks No Fall Risks  Follow up Falls evaluation completed;Education provided Falls evaluation completed Falls prevention discussed Falls evaluation completed  Falls evaluation completed      Data saved with a previous flowsheet row definition    MEDICARE RISK AT HOME: Medicare Risk at Home Any stairs in or around the home?: Yes If so, are there any without handrails?: No Home free of loose throw rugs in walkways, pet beds, electrical cords, etc?: Yes Adequate lighting in your home to reduce risk of falls?: Yes Life alert?: No Use of a cane, walker or w/c?: No Grab bars in the bathroom?: Yes Shower chair or bench in shower?: Yes Elevated toilet seat or a handicapped toilet?: No  TIMED UP AND GO:  Was the test performed?  No    Cognitive Function:    12/30/2018    9:38 AM 05/31/2018    1:01 PM  MMSE - Mini Mental State Exam  Orientation to time 1 2  Orientation to Place 4 4  Registration 3 3  Attention/ Calculation 2 2  Recall 0 0  Language- name 2 objects 2 2  Language- repeat 1 1  Language- follow 3 step command 3 3  Language- read & follow direction 1 1  Write a sentence 1 1  Copy design 1 1  Total score 19 20        12/31/2023    2:11 PM 12/04/2022   10:54 AM 11/26/2021   11:00 AM  6CIT Screen  What Year? 4 points 4 points 4 points  What month? 3 points 3 points  3 points  What time? 3 points 3 points 3 points  Count back from 20 4 points 0 points 0 points  Months in reverse 0 points 0 points 4 points  Repeat phrase 0 points 0 points 10 points  Total Score 14 points 10 points 24 points    Immunizations Immunization History  Administered Date(s) Administered   Hepatitis A, Adult 07/01/2013   Influenza, Seasonal, Injecte, Preservative Fre 06/15/2023   PFIZER(Purple Top)SARS-COV-2 Vaccination 07/10/2019, 07/31/2019, 03/21/2020   PNEUMOCOCCAL CONJUGATE-20 12/04/2022    Pneumococcal Conjugate-13 01/30/2017   Pneumococcal Polysaccharide-23 11/18/2018   Tdap 10/04/2002, 12/24/2012    TDAP status: Due, Education has been provided regarding the importance of this vaccine. Advised may receive this vaccine at local pharmacy or Health Dept. Aware to provide a copy of the vaccination record if obtained from local pharmacy or Health Dept. Verbalized acceptance and understanding.  Flu Vaccine status: Due, Education has been provided regarding the importance of this vaccine. Advised may receive this vaccine at local pharmacy or Health Dept. Aware to provide a copy of the vaccination record if obtained from local pharmacy or Health Dept. Verbalized acceptance and understanding.  Pneumococcal vaccine status: Up to date  Covid-19 vaccine status: Information provided on how to obtain vaccines.   Qualifies for Shingles Vaccine? Yes   Zostavax completed No   Shingrix Completed?: No.    Education has been provided regarding the importance of this vaccine. Patient has been advised to call insurance company to determine out of pocket expense if they have not yet received this vaccine. Advised may also receive vaccine at local pharmacy or Health Dept. Verbalized acceptance and understanding.  Screening Tests Health Maintenance  Topic Date Due   DTaP/Tdap/Td (3 - Td or Tdap) 12/25/2022   Influenza Vaccine  11/20/2023   Medicare Annual Wellness (AWV)  11/29/2024   Mammogram  06/07/2025   Colonoscopy  02/18/2030   Pneumococcal Vaccine: 50+ Years  Completed   HPV VACCINES  Aged Out   Meningococcal B Vaccine  Aged Out   DEXA SCAN  Discontinued   COVID-19 Vaccine  Discontinued   Hepatitis C Screening  Discontinued   Zoster Vaccines- Shingrix  Discontinued    Health Maintenance  Health Maintenance Due  Topic Date Due   DTaP/Tdap/Td (3 - Td or Tdap) 12/25/2022   Influenza Vaccine  11/20/2023    Colorectal cancer screening: Type of screening: Colonoscopy. Completed  02/19/23. Repeat every 7 years  Mammogram status: Completed 06/08/2023. Repeat every year  Discontinued Bone density per Medical decision.  Lung Cancer Screening: (Low Dose CT Chest recommended if Age 62-80 years, 20 pack-year currently smoking OR have quit w/in 15years.) does not qualify.    Additional Screening:  Hepatitis C Screening: does not qualify; Completed   Vision Screening: Recommended annual ophthalmology exams for early detection of glaucoma and other disorders of the eye. Is the patient up to date with their annual eye exam?  Yes  Who is the provider or what is the name of the office in which the patient attends annual eye exams? [facility] If pt is not established with a provider, would they like to be referred to a provider to establish care? No .   Dental Screening: Recommended annual dental exams for proper oral hygiene   Community Resource Referral / Chronic Care Management: CRR required this visit?  No   CCM required this visit?  No     Plan:     I have personally reviewed and noted the  following in the patient's chart:   Medical and social history Use of alcohol, tobacco or illicit drugs  Current medications and supplements including opioid prescriptions. Patient is not currently taking opioid prescriptions. Functional ability and status Nutritional status Physical activity Advanced directives List of other physicians Hospitalizations, surgeries, and ER visits in previous 12 months Vitals Screenings to include cognitive, depression, and falls Referrals and appointments  In addition, I have reviewed and discussed with patient certain preventive protocols, quality metrics, and best practice recommendations. A written personalized care plan for preventive services as well as general preventive health recommendations were provided to patient.     Osbaldo Mark Zelda, CMA   12/31/2023   After Visit Summary: (In Person-Printed) AVS printed  and given to the patient   I, Ronal JINNY Hailstone, MD, have reviewed all documentation for this visit. The documentation on 12/31/2023 for the exam, diagnosis, procedures, and orders are all accurate and complete.   I have reviewed and agree with the above Annual Wellness Visit documentation.  Ronal Norleen Hailstone, MD. Internal Medicine 12/31/2023

## 2024-01-01 ENCOUNTER — Other Ambulatory Visit

## 2024-01-01 ENCOUNTER — Telehealth: Payer: Self-pay | Admitting: Internal Medicine

## 2024-01-01 ENCOUNTER — Ambulatory Visit (INDEPENDENT_AMBULATORY_CARE_PROVIDER_SITE_OTHER)

## 2024-01-01 DIAGNOSIS — E039 Hypothyroidism, unspecified: Secondary | ICD-10-CM | POA: Diagnosis not present

## 2024-01-01 DIAGNOSIS — R413 Other amnesia: Secondary | ICD-10-CM | POA: Diagnosis not present

## 2024-01-01 DIAGNOSIS — I10 Essential (primary) hypertension: Secondary | ICD-10-CM | POA: Diagnosis not present

## 2024-01-01 DIAGNOSIS — Z23 Encounter for immunization: Secondary | ICD-10-CM

## 2024-01-01 DIAGNOSIS — Z111 Encounter for screening for respiratory tuberculosis: Secondary | ICD-10-CM

## 2024-01-01 DIAGNOSIS — E7800 Pure hypercholesterolemia, unspecified: Secondary | ICD-10-CM | POA: Diagnosis not present

## 2024-01-01 DIAGNOSIS — G4733 Obstructive sleep apnea (adult) (pediatric): Secondary | ICD-10-CM | POA: Diagnosis not present

## 2024-01-04 ENCOUNTER — Ambulatory Visit: Payer: Self-pay | Admitting: Internal Medicine

## 2024-01-04 DIAGNOSIS — M0579 Rheumatoid arthritis with rheumatoid factor of multiple sites without organ or systems involvement: Secondary | ICD-10-CM | POA: Diagnosis not present

## 2024-01-04 DIAGNOSIS — Z79899 Other long term (current) drug therapy: Secondary | ICD-10-CM | POA: Diagnosis not present

## 2024-01-04 LAB — QUANTIFERON-TB GOLD PLUS
Mitogen-NIL: 7.66 [IU]/mL
NIL: 0.04 [IU]/mL
QuantiFERON-TB Gold Plus: NEGATIVE
TB1-NIL: 0 [IU]/mL
TB2-NIL: 0 [IU]/mL

## 2024-01-04 NOTE — Telephone Encounter (Signed)
 Referral has been faxed.

## 2024-01-10 ENCOUNTER — Encounter: Payer: Self-pay | Admitting: Internal Medicine

## 2024-01-10 NOTE — Patient Instructions (Addendum)
 Making referral to [facility]. Making referral to Endocrinology to follow up on thyroid  nodules. I do not think she has been compliant with cholesterol lowering medication. Could consider coronary calcium  scoring to determine degree of heart disease but family would need to pay out of pocket for this (cost is $150). Husband declines at this time.

## 2024-01-11 ENCOUNTER — Encounter (INDEPENDENT_AMBULATORY_CARE_PROVIDER_SITE_OTHER): Payer: Self-pay

## 2024-01-11 ENCOUNTER — Ambulatory Visit: Admitting: Pulmonary Disease

## 2024-01-11 ENCOUNTER — Encounter: Payer: Self-pay | Admitting: Pulmonary Disease

## 2024-01-11 VITALS — BP 120/76 | HR 50 | Temp 97.9°F | Ht 62.0 in | Wt 141.0 lb

## 2024-01-11 DIAGNOSIS — R9402 Abnormal brain scan: Secondary | ICD-10-CM

## 2024-01-11 DIAGNOSIS — R590 Localized enlarged lymph nodes: Secondary | ICD-10-CM

## 2024-01-11 NOTE — Patient Instructions (Signed)
  VISIT SUMMARY: Today, we reviewed your lung health and discussed your lymph nodes, sleep apnea, nasal inflammation, and rheumatoid arthritis.  YOUR PLAN: BORDERLINE MEDIASTINAL LYMPHADENOPATHY: Your recent CT scan showed that your lymph nodes are slightly enlarged but stable. This is likely a benign condition. -No immediate action is needed as the condition appears benign.  NASAL INFLAMMATION: You have nonspecific inflammation in your nasal area, but no current symptoms of sinus issues or congestion. -You will be referred to an ENT specialist for further evaluation.  RHEUMATOID ARTHRITIS WITHOUT PULMONARY INVOLVEMENT: Your rheumatoid arthritis is well-controlled with your current medications, and there is no lung involvement. -Continue taking leflunomide and receiving Simponi  infusions as prescribed by your rheumatologist.                              Contains text generated by [employer].

## 2024-01-11 NOTE — Progress Notes (Signed)
 [name]    [id number]    July 10, 1950  Primary Care Physician:Baxley, Ronal PARAS, MD  Referring Physician: Perri Ronal PARAS, MD [address] [place],  [place] [id number]  Chief complaint: Follow-up for lymphadenopathy, rheumatoid arthritis  HPI: 73 y.o. who  has a past medical history of Anemia, Arthritis, Cognitive deficits, Hyperlipidemia, Hypertension, Memory loss or impairment, Pre-diabetes, and Sleep apnea.,  Rheumatoid arthritis  Patient was seen in the emergency room on 03/07/2022 with atypical chest pain.  Troponins x 2 was negative and EKG without acute ischemic changes.  She obtain CT angiogram as D-dimer was elevated which did not show any blood clot.  There was a notation of axillary lymphadenopathy and has been referred here for further evaluation  History notable for rheumatoid arthritis.  She follows with [facility] and had been on methotrexate until 2022 which was stopped and treatment changed to leflunomide and Simponi  injections due to persistent joint symptoms.  She states that her arthritis is under better control now  Interim history: Discussed the use of AI scribe software for clinical note transcription with the patient, who gave verbal consent to proceed. History of Present Illness [name] is a 73 year old female who presents for follow-up on lung issues.  Lymphadenopathy - Lymph nodes are slightly borderline enlarged but stable on recent CT scans compared to previous imaging - PET scan last year showed no uptake  Obstructive sleep apnea - Previously on CPAP therapy but did not tolerate it well, often removing it during the night - No sinus issues or congestion    Relevant pulmonary history Pets: Used to have a dog Occupation: Works for Clinical biochemist in [employer] Exposures: No mold, hot tub, Financial controller.  No feather pillows or comforters Smoking history: Never smoker Travel history: No significant travel  history Relevant family history: No family history of lung disease.  Outpatient Encounter Medications as of 01/11/2024  Medication Sig   acetaminophen  (TYLENOL ) 325 MG tablet Take 2 tablets (650 mg total) by mouth every 6 (six) hours as needed for moderate pain (pain score 4-6).   bacitracin  ointment Apply 1 Application topically 2 (two) times daily.   leflunomide (ARAVA) 20 MG tablet Take 20 mg by mouth daily.   lisinopril  (ZESTRIL ) 10 MG tablet Take 1 tablet (10 mg total) by mouth daily.   Multiple Vitamins-Minerals (MULTIVITAMIN WITH MINERALS) tablet Take 1 tablet by mouth daily.   Omega-3 1000 MG CAPS Take by mouth daily.   rosuvastatin  (CRESTOR ) 20 MG tablet Take 1 tablet (20 mg total) by mouth daily.   triamterene -hydrochlorothiazide  (MAXZIDE-25) 37.5-25 MG tablet Take 1 tablet by mouth daily.   ALPRAZolam  (XANAX ) 0.5 MG tablet Take 1 tablet (0.5 mg total) by mouth 2 (two) times daily as needed for anxiety. (Patient not taking: Reported on 01/11/2024)   levothyroxine  (SYNTHROID ) 50 MCG tablet Take 1 tablet (50 mcg total) by mouth daily. (Patient not taking: Reported on 01/11/2024)   pantoprazole  (PROTONIX ) 40 MG tablet TAKE 1 TABLET BY MOUTH DAILY (Patient not taking: Reported on 01/11/2024)   sertraline  (ZOLOFT ) 100 MG tablet Take 1 tablet (100 mg total) by mouth daily. (Patient not taking: Reported on 01/11/2024)   traMADol  (ULTRAM ) 50 MG tablet Take 1 tablet (50 mg total) by mouth every 6 (six) hours as needed. (Patient not taking: Reported on 01/11/2024)   No facility-administered encounter medications on file as of 01/11/2024.    Vitals:   01/11/24 1128  BP:  120/76  Pulse: (!) 50  Temp: 97.9 F (36.6 C)  Height: 5' 2 (1.575 m)  Weight: 141 lb (64 kg)  SpO2: 98%  TempSrc: Oral  BMI (Calculated): 25.78     Physical Exam GEN: No acute distress. CV: Regular rate and rhythm, no murmurs. LUNGS: Clear to auscultation bilaterally, normal respiratory effort. SKIN JOINTS: Warm and  dry, no rash.    Data Reviewed: Imaging: CTA 03/08/2022-no pulmonary embolism, bibasilar atelectasis, enlarged axillary lymph nodes.  No mediastinal or hilar lymphadenopathy.  PET scan 10/17/2022 Borderline axillary lymph nodes are slightly decreased in size.  There is no uptake in hilar lymph nodes Uptake noted in shoulder joint, thyroid , nasopharynx and cecum  CT high-resolution 10/15/2023-bilateral axillary and subcentimeter mediastinal lymph nodes stable compared to prior I have reviewed the images personally.  PFTs: 01/11/2024 FVC 2.95 [92%], FEV1 1.70 [69%], F/F58, TLC 5.33 [105%], DLCO 20.34 [102%] Mild Obstructive Airways Disease Patient has submaximal effort which may overestimate the degree of obstruction   Labs:  Assessment & Plan Mediastinal lymphadenopathy CT scan from June showed borderline enlargement of mediastinal lymph nodes, likely reactive and benign. Previous PET scan showed no uptake, supporting a benign etiology.  Nasal inflammation Nonspecific inflammation in the nasal area from PET scan last year. No current symptoms of sinus issues or congestion. - Refer to ENT for evaluation of nasal inflammation.  Rheumatoid arthritis without pulmonary involvement Rheumatoid arthritis is well-controlled with leflunomide and Simponi  (golimumab ) infusions every two months. No pulmonary involvement or interstitial lung disease. Methotrexate was discontinued due to administration issues. - Continue leflunomide and Simponi  infusions as prescribed by rheumatologist.  Thyroid  nodule Already evaluated by biopsy showing benign follicular nodule  Cecal inflammation Had PET uptake in the area of cecum.  Colonoscopy completed with no concerning findings except for benign polyps.   Plan/Recommendations: Referral to ENT  Lonna Coder MD [facility] 01/11/2024, 11:47 AM  CC: Perri Ronal PARAS, MD

## 2024-02-04 ENCOUNTER — Ambulatory Visit (INDEPENDENT_AMBULATORY_CARE_PROVIDER_SITE_OTHER)

## 2024-02-04 ENCOUNTER — Encounter (INDEPENDENT_AMBULATORY_CARE_PROVIDER_SITE_OTHER): Payer: Self-pay

## 2024-02-04 VITALS — BP 117/68 | HR 57 | Temp 98.0°F | Ht 62.0 in | Wt 141.0 lb

## 2024-02-04 DIAGNOSIS — J392 Other diseases of pharynx: Secondary | ICD-10-CM

## 2024-02-04 NOTE — Progress Notes (Signed)
 HPI:   [name] is a 73 y.o. female who presents as a new patient being seen in consultation for evaluation of the nasopharynx after finding of increased FDG uptake on PET done 1 year ago. Patient presents with her husband who is assisting in the history. Patient states that she has not had any nasal congestion or nasal symptoms. She denies any epistaxis, decreased smell, nasal obstruction or difficulty with sleep. Patient had some axillary lymphadenopathy one year ago and underwent a PET scan at that time. She states that she does recall having URI symptoms at that time and that she did feel unwell. She denies those symptoms at this time but states she did feel ill at the time of the PET scan. She denies any history of radiation exposure. Denies any hx of head or neck cancer. Denies any ear fullness or discomfort. Denies any night sweats, lethargy, change in appetite, or unexpected weight loss.    PMH/Meds/All/SocHx/FamHx/ROS: Past Medical History:  Diagnosis Date   Anemia    Arthritis    Cognitive deficits    Hyperlipidemia    Hypertension    Memory loss or impairment    Pre-diabetes    Sleep apnea    Past Surgical History:  Procedure Laterality Date   COLONOSCOPY  2011   COLONOSCOPY WITH ESOPHAGOGASTRODUODENOSCOPY (EGD)  02/19/2023   Estefana Kidney at [facility], gastritis   GANGLION CYST EXCISION     l hand   JOINT REPLACEMENT     knee right   TUBAL LIGATION     No family history of bleeding disorders, wound healing problems or difficulty with anesthesia.  Social Connections: Socially Integrated (12/31/2023)   Social Connection and Isolation Panel    Frequency of Communication with Friends and Family: Twice a week    Frequency of Social Gatherings with Friends and Family: Twice a week    Attends Religious Services: 1 to 4 times per year    Active Member of [employer] or Organizations: Yes    Attends Banker Meetings: 1 to 4 times per year    Marital Status: Married     Current Outpatient Medications:    acetaminophen  (TYLENOL ) 325 MG tablet, Take 2 tablets (650 mg total) by mouth every 6 (six) hours as needed for moderate pain (pain score 4-6)., Disp: 30 tablet, Rfl: 0   bacitracin  ointment, Apply 1 Application topically 2 (two) times daily., Disp: 120 g, Rfl: 0   leflunomide (ARAVA) 20 MG tablet, Take 20 mg by mouth daily., Disp: , Rfl:    lisinopril  (ZESTRIL ) 10 MG tablet, Take 1 tablet (10 mg total) by mouth daily., Disp: 90 tablet, Rfl: 3   Multiple Vitamins-Minerals (MULTIVITAMIN WITH MINERALS) tablet, Take 1 tablet by mouth daily., Disp: , Rfl:    Omega-3 1000 MG CAPS, Take by mouth daily., Disp: , Rfl:    rosuvastatin  (CRESTOR ) 20 MG tablet, Take 1 tablet (20 mg total) by mouth daily., Disp: 90 tablet, Rfl: 3   triamterene -hydrochlorothiazide  (MAXZIDE-25) 37.5-25 MG tablet, Take 1 tablet by mouth daily., Disp: 90 tablet, Rfl: 3   ALPRAZolam  (XANAX ) 0.5 MG tablet, Take 1 tablet (0.5 mg total) by mouth 2 (two) times daily as needed for anxiety. (Patient not taking: Reported on 02/04/2024), Disp: 60 tablet, Rfl: 2   levothyroxine  (SYNTHROID ) 50 MCG tablet, Take 1 tablet (50 mcg total) by mouth daily. (Patient not taking: Reported on 02/04/2024), Disp: 90 tablet, Rfl: 3   pantoprazole  (PROTONIX ) 40 MG tablet, TAKE 1 TABLET BY MOUTH  DAILY (Patient not taking: Reported on 02/04/2024), Disp: 90 tablet, Rfl: 1   sertraline  (ZOLOFT ) 100 MG tablet, Take 1 tablet (100 mg total) by mouth daily. (Patient not taking: Reported on 02/04/2024), Disp: 90 tablet, Rfl: 2   traMADol  (ULTRAM ) 50 MG tablet, Take 1 tablet (50 mg total) by mouth every 6 (six) hours as needed. (Patient not taking: Reported on 02/04/2024), Disp: 15 tablet, Rfl: 0 A complete ROS was performed with pertinent positives/negatives noted in the HPI. The remainder of the ROS are negative.   Physical Exam:  BP 117/68 (BP Location: Right Arm, Patient Position: Sitting, Cuff Size: Normal)   Pulse (!) 57    Temp 98 F (36.7 C) (Oral)   Ht 5' 2 (1.575 m)   Wt 141 lb (64 kg)   SpO2 95%   BMI 25.79 kg/m  General: Well developed, well nourished. No acute distress. Voice normal Head/Face: Normocephalic. No sinus tenderness. Facial nerve intact and equal bilaterally. No facial lacerations. Eyes: PERRL, no scleral icterus or conjunctival hemorrhage. EOMI. Ears: No gross deformity. Normal external canal. Tympanic membrane in tact bilaterally Hearing: Normal speech reception.  Nose: No gross deformity or lesions. No purulent discharge. No turbinate hypertrophy. Mouth/Oropharynx: Lips without any lesions. Dentition good. No mucosal lesions within the oropharynx. No tonsillar enlargement, exudate, or lesions. Pharyngeal walls symmetrical. Uvula midline. Tongue midline without lesions. Larynx: See TFL if applicable Nasopharynx: See TFL if applicable Neck: Trachea midline. No masses. No thyromegaly or nodules palpated. No crepitus. Lymphatic: No lymphadenopathy in the neck. Respiratory: No stridor or distress. Room air. Cardiovascular: Regular rate and rhythm. Extremities: No edema or cyanosis. Warm and well-perfused. Skin: No scars or lesions on face or neck. Neurologic: CN II-XII grossly intact. Moving all extremities without gross abnormality. Other:  Independent Review of Additional Tests or Records: 10/17/2022 - PETCT - increased FDG uptake and soft tissue fullness within the nasopharynx   Procedures: Flexible Fiberoptic Nasal Endoscopy Procedure Note  Date of procedure 02/05/2024 Pre-Op Diagnosis: PET avidity in nasopharynx    Post Op Diagnosis: same Procedure: Flexible Fiberoptic Endoscopy CPT 31575 - Mod 25 Surgeon: Adah Malkin, D.O. Anesthesia: 4% Lidocaine with Afrin Complications: None Procedure Detail: After verbal consent was obtained from the patient, the patient was brought in an upright position, a fiberoptic nasal laryngoscope was then passed into the patient right nasal  passage and left nasal passage, the left appeared to be more patent. It was passed along the floor of the nasal cavity to the nasopharynx. Torus tubarius was patent and the Fossa of Rosenmller was identified. The scope was flexed and turned to visualize the contralateral Torus tubarius and Fossa of Rosenmller which were also unremarkable. The scope was then flexed caudally and advanced slowly through the nasopharynx. The patient's nasopharynx was unremarkable with no signs of any gross lesions, edema, masses, or bleeding. Centrally there was adenoid tissue present without any mass or ulceration. The scope was then slowly withdrawn from the patient. The patient tolerated the procedure well and there were no complications.  Disposition: Stable   Impression & Plans: CARLYON NOLASCO is a 73 y.o. female with increased FDG uptake in the nasopharynx seen on PET 10/17/2022. Patient does not have any nasal symptoms at this time.   Benign mass of nasopharynx - PET avid lesion noted in nasopharynx - Scope exam performed today - Extensive discussion with patient and husband regarding inability to diagnose definitively without tissue biopsy. Patient and husband would like to discuss further before making decision  regarding biopsy in OR. Discussed all risks and benefits of biopsy vs observation with patient and husband including the risk that there could be an occult malignancy in the nasopharynx.   Follow-up as needed. Patient will call to schedule biopsy in OR when she has made her decision.   Adah Malkin, DO [facility]

## 2024-02-22 ENCOUNTER — Other Ambulatory Visit (INDEPENDENT_AMBULATORY_CARE_PROVIDER_SITE_OTHER): Payer: Self-pay

## 2024-02-22 ENCOUNTER — Telehealth (INDEPENDENT_AMBULATORY_CARE_PROVIDER_SITE_OTHER): Payer: Self-pay

## 2024-02-22 DIAGNOSIS — J392 Other diseases of pharynx: Secondary | ICD-10-CM

## 2024-02-22 NOTE — Telephone Encounter (Signed)
 Pt would like to proceed with biopsy please place order

## 2024-03-04 ENCOUNTER — Telehealth: Payer: Self-pay | Admitting: Pharmacy Technician

## 2024-03-04 ENCOUNTER — Encounter (HOSPITAL_COMMUNITY): Payer: Self-pay | Admitting: Pharmacy Technician

## 2024-03-04 NOTE — Progress Notes (Signed)
   03/04/2024  Patient ID: [name], female   DOB: 1950/09/25, 73 y.o.   MRN: [id number]  Pharmacy Quality Measure Review  This patient is appearing on a report for being at risk of failing the adherence measure for hypertension (ACEi/ARB) medications this calendar year.   Medication: Lisinopril  Last fill date: 02/23/2024 for 90 day supply  Insurance report was not up to date. No action needed at this time.    Kainoa Swoboda, CPhT [facility] Office: [phone] Email: [email or web address]

## 2024-03-04 NOTE — Patient Outreach (Signed)
 Erroneous Encounter.  Enis Riecke, CPhT [facility] Office: [phone] Email: [email or web address]

## 2024-03-08 ENCOUNTER — Ambulatory Visit (HOSPITAL_BASED_OUTPATIENT_CLINIC_OR_DEPARTMENT_OTHER): Admit: 2024-03-08

## 2024-03-08 ENCOUNTER — Encounter (HOSPITAL_BASED_OUTPATIENT_CLINIC_OR_DEPARTMENT_OTHER): Payer: Self-pay

## 2024-03-08 SURGERY — BIOPSY, NASOPHARYNX
Anesthesia: General

## 2024-03-09 ENCOUNTER — Ambulatory Visit: Admitting: Internal Medicine

## 2024-03-09 ENCOUNTER — Encounter: Payer: Self-pay | Admitting: Internal Medicine

## 2024-03-09 VITALS — BP 124/78 | HR 63 | Ht 62.0 in | Wt 148.0 lb

## 2024-03-09 DIAGNOSIS — E042 Nontoxic multinodular goiter: Secondary | ICD-10-CM | POA: Diagnosis not present

## 2024-03-09 NOTE — Progress Notes (Signed)
 Name: [name]  MRN/ DOB: [id number], [birth year]    Age/ Sex: 73 y.o., female    PCP: Perri Ronal PARAS, MD   Reason for Endocrinology Evaluation: MNG     Date of Initial Endocrinology Evaluation: 03/09/2024     HPI: [name] is a 73 y.o. female with a past medical history of anemia, cognitive deficit, dyslipidemia, HTN, arthritis and rheumatoid arthritis. The patient presented for initial endocrinology clinic visit on 03/09/2024 for consultative assistance with her MNG.   Patient follows with rheumatology for rheumatoid arthritis.   Patient has been following up with pulmonology for mediastinal lymphadenopathy and OSA.  During evaluation through a PET scan she was noted with a right sided hypermetabolic thyroid  nodule, which underwent benign FNA in September, 2024.  Historically the patient has had normal TFTs with the latest normal TSH August, 2025   She is accompanied by her spouse No local neck swelling  No dysphagia  No neck pain  No palpitations  No tremors  No constipation or diarrhea     No FH of thyroid  disease    HISTORY:  Past Medical History:  Past Medical History:  Diagnosis Date   Anemia    Arthritis    Cognitive deficits    Hyperlipidemia    Hypertension    Memory loss or impairment    Pre-diabetes    Sleep apnea    Past Surgical History:  Past Surgical History:  Procedure Laterality Date   COLONOSCOPY  2011   COLONOSCOPY WITH ESOPHAGOGASTRODUODENOSCOPY (EGD)  02/19/2023   [facility] at [facility], gastritis   GANGLION CYST EXCISION     l hand   JOINT REPLACEMENT     knee right   TUBAL LIGATION      Social History:  reports that she has never smoked. She has never used smokeless tobacco. She reports current alcohol use. She reports that she does not use drugs. Family History: family history includes Colon cancer in her sister; Diabetes in her mother; Heart disease in her father; Hypertension in her mother; Kidney disease in her  father; Stroke in her sister.   HOME MEDICATIONS: Allergies as of 03/09/2024   No Known Allergies      Medication List        Accurate as of March 09, 2024  2:05 PM. If you have any questions, ask your nurse or doctor.          STOP taking these medications    ALPRAZolam  0.5 MG tablet Commonly known as: XANAX  Stopped by: Donell PARAS Tylek Boney   levothyroxine  50 MCG tablet Commonly known as: SYNTHROID  Stopped by: Loriann Bosserman J Maciah Feeback   Omega-3 1000 MG Caps Stopped by: Carlee Tesfaye J Jemmie Ledgerwood   pantoprazole  40 MG tablet Commonly known as: PROTONIX  Stopped by: Donell PARAS Ashlley Booher   rosuvastatin  20 MG tablet Commonly known as: Crestor  Stopped by: Donell PARAS Nikhil Osei   traMADol  50 MG tablet Commonly known as: ULTRAM  Stopped by: Donell PARAS Daksha Koone       TAKE these medications    acetaminophen  325 MG tablet Commonly known as: Tylenol  Take 2 tablets (650 mg total) by mouth every 6 (six) hours as needed for moderate pain (pain score 4-6).   ADULT OMEGA PLUS DHA PO Take by mouth.   bacitracin  ointment Apply 1 Application topically 2 (two) times daily.   D3 + K2 PO Take by mouth.   leflunomide 20 MG tablet Commonly known as: ARAVA Take 20 mg by mouth daily.  lisinopril  10 MG tablet Commonly known as: ZESTRIL  Take 1 tablet (10 mg total) by mouth daily.   multivitamin with minerals tablet Take 1 tablet by mouth daily.   OVER THE COUNTER MEDICATION CurcumaSorb   OVER THE COUNTER MEDICATION Neurovite Plus   OVER THE COUNTER MEDICATION Magtech magnesium   RED YEAST RICE PO Take by mouth.   sertraline  100 MG tablet Commonly known as: ZOLOFT  Take 1 tablet (100 mg total) by mouth daily.   SIMPONI  [place] Inject into the skin every 2 (two) months.   triamterene -hydrochlorothiazide  37.5-25 MG tablet Commonly known as: MAXZIDE-25 Take 1 tablet by mouth daily.          REVIEW OF SYSTEMS: A comprehensive ROS was conducted with the patient  and is negative except as per HPI     OBJECTIVE:  VS: BP 124/78   Pulse 63   Ht 5' 2 (1.575 m)   Wt 148 lb (67.1 kg)   SpO2 97%   BMI 27.07 kg/m    Wt Readings from Last 3 Encounters:  03/09/24 148 lb (67.1 kg)  02/04/24 141 lb (64 kg)  01/11/24 141 lb (64 kg)     EXAM: General: Pt appears well and is in NAD  Neck: General: Supple without adenopathy. Thyroid : Thyroid  size normal.  No goiter or nodules appreciated.  Lungs: Clear with good BS bilat   Heart: Auscultation: RRR.  Abdomen: Soft, nontender  Extremities:  BL LE: No pretibial edema   Mental Status: Judgment, insight: Intact Orientation: Oriented to time, place, and person Mood and affect: No depression, anxiety, or agitation     DATA REVIEWED:  Latest Reference Range & Units 11/30/23 12:34  TSH 0.40 - 4.50 mIU/L 2.02     Thyroid  ultrasound 11/10/2022  FINDINGS: Parenchymal Echotexture: Moderately heterogenous   Isthmus: Normal in size measuring 0.3 cm in diameter   Right lobe: Normal in size measuring 5.0 x 2.2 x 2.0 cm   Left lobe: Normal in size measuring 4.4 x 2.1 x 1.8 cm   _________________________________________________________   Estimated total number of nodules >/= 1 cm: 2   Number of spongiform nodules >/=  2 cm not described below (TR1): 0   Number of mixed cystic and solid nodules >/= 1.5 cm not described below (TR2): 0   _________________________________________________________   There is a 1.2 x 0.7 x 0.7 cm peripherally calcified ill-defined nodule within mid aspect of the right lobe of the thyroid  which likely correlates with the hypermetabolic nodule seen on preceding PET-CT image 37, series 604, and as such could undergo ultrasound-guided fine-needle aspiration as indicated.   _________________________________________________________   There is a punctate (0.6 cm) anechoic cyst within the inferior pole of the right lobe of the thyroid  (labeled 2), which does not  meet criteria to recommend percutaneous sampling or continued dedicated follow-up.   _________________________________________________________   Nodule # 3:   Location: Left; Inferior   Maximum size: 1.9 cm; Other 2 dimensions: 1.5 x 1.4 cm   Composition: solid/almost completely solid (2)   Echogenicity: isoechoic (1)   Shape: not taller-than-wide (0)   Margins: smooth (0)   Echogenic foci: none (0)   ACR TI-RADS total points: 3.   ACR TI-RADS risk category: TR3 (3 points).   ACR TI-RADS recommendations:   *Given size (>/= 1.5 - 2.4 cm) and appearance, a follow-up ultrasound in 1 year should be considered based on TI-RADS criteria.   _________________________________________________________   IMPRESSION: 1. Findings suggestive of multinodular goiter. 2. Nodule  labeled #1 likely correlates with the hypermetabolic nodule demonstrated on preceding PET-CT and as such could undergo ultrasound-guided fine-needle aspiration as indicated. 3. Nodule labeled #3 meets imaging criteria to recommend a 1 year follow-up.    FNA Right nodule 01/07/2023  Clinical History: There is a 1.2 x 0.7x 0.7 cm peripherally calcified  ill-defined nodule within mid aspect of the right lobe of the thyroid   which likely correlates with the hypermetabolic nodule seen on preceding  PET-CT image 37, series 604, and as such could undergo ultr  Specimen Submitted:  A. THYROID , RIGHT LOBE, FINE NEEDLE ASPIRATION    FINAL MICROSCOPIC DIAGNOSIS:  - Benign follicular nodule (Bethesda category II)    Old records , labs and images have been reviewed.    ASSESSMENT/PLAN/RECOMMENDATIONS:   Multinodular goiter:  -Patient is clinically euthyroid -No local neck symptoms - S/P benign FNA of the right hypermetabolic thyroid  nodule in 12/2022 -We discussed the importance of annual thyroid  ultrasound for up to 5 years to confirm stability.  We discussed the 3-5 risk of malignancy and thyroid   nodules -Thyroid  ultrasound orders have been placed    Follow-up in 1 year  Signed electronically by: Stefano Redgie Butts, MD  [facility] Endocrinology  [facility] [address] [place], [place] [address] Phone: [phone] FAX: [phone]   CC: Perri Ronal PARAS, MD [address] [place] [address] Phone: [phone] Fax: [phone]   Return to Endocrinology clinic as below: Future Appointments  Date Time Provider Department Center  01/03/2025  9:40 AM [facility] [address]  01/05/2025  2:00 PM Baxley, Ronal PARAS, MD [facility] [address]

## 2024-03-14 ENCOUNTER — Ambulatory Visit (INDEPENDENT_AMBULATORY_CARE_PROVIDER_SITE_OTHER)

## 2024-03-22 ENCOUNTER — Ambulatory Visit
Admission: RE | Admit: 2024-03-22 | Discharge: 2024-03-22 | Disposition: A | Source: Ambulatory Visit | Attending: Internal Medicine | Admitting: Internal Medicine

## 2024-03-22 DIAGNOSIS — E042 Nontoxic multinodular goiter: Secondary | ICD-10-CM

## 2024-03-23 ENCOUNTER — Ambulatory Visit: Payer: Self-pay | Admitting: Internal Medicine

## 2024-04-25 DIAGNOSIS — M0579 Rheumatoid arthritis with rheumatoid factor of multiple sites without organ or systems involvement: Secondary | ICD-10-CM

## 2024-04-25 NOTE — Telephone Encounter (Signed)
 Patient states new referral needs to to be sent to [facility].

## 2024-04-27 ENCOUNTER — Other Ambulatory Visit: Payer: Self-pay

## 2024-04-27 DIAGNOSIS — M0579 Rheumatoid arthritis with rheumatoid factor of multiple sites without organ or systems involvement: Secondary | ICD-10-CM

## 2024-05-17 ENCOUNTER — Other Ambulatory Visit: Payer: Self-pay

## 2024-05-17 MED ORDER — SERTRALINE HCL 100 MG PO TABS: 100.0000 mg | ORAL_TABLET | Freq: Every day | ORAL | 2 refills | Status: AC

## 2025-01-03 ENCOUNTER — Other Ambulatory Visit: Payer: Self-pay

## 2025-01-05 ENCOUNTER — Ambulatory Visit: Payer: Self-pay | Admitting: Internal Medicine

## 2025-03-09 ENCOUNTER — Ambulatory Visit: Admitting: Internal Medicine
